# Patient Record
Sex: Female | Born: 1935 | Race: White | Hispanic: No | Smoking: Never smoker
Health system: Southern US, Community
[De-identification: ages and names within clinical notes are randomized; demographics above are authoritative.]

## PROBLEM LIST (undated history)

## (undated) DIAGNOSIS — N3289 Other specified disorders of bladder: Secondary | ICD-10-CM

## (undated) DIAGNOSIS — N1330 Unspecified hydronephrosis: Secondary | ICD-10-CM

## (undated) DIAGNOSIS — N3941 Urge incontinence: Secondary | ICD-10-CM

## (undated) DIAGNOSIS — I10 Essential (primary) hypertension: Secondary | ICD-10-CM

## (undated) DIAGNOSIS — R202 Paresthesia of skin: Secondary | ICD-10-CM

## (undated) DIAGNOSIS — E785 Hyperlipidemia, unspecified: Secondary | ICD-10-CM

## (undated) DIAGNOSIS — K219 Gastro-esophageal reflux disease without esophagitis: Secondary | ICD-10-CM

## (undated) DIAGNOSIS — R350 Frequency of micturition: Secondary | ICD-10-CM

## (undated) DIAGNOSIS — Z87440 Personal history of urinary (tract) infections: Secondary | ICD-10-CM

## (undated) DIAGNOSIS — R2681 Unsteadiness on feet: Secondary | ICD-10-CM

## (undated) DIAGNOSIS — Z8739 Personal history of other diseases of the musculoskeletal system and connective tissue: Secondary | ICD-10-CM

## (undated) DIAGNOSIS — R351 Nocturia: Secondary | ICD-10-CM

## (undated) DIAGNOSIS — Z9889 Other specified postprocedural states: Secondary | ICD-10-CM

## (undated) DIAGNOSIS — M1990 Unspecified osteoarthritis, unspecified site: Secondary | ICD-10-CM

## (undated) DIAGNOSIS — R200 Anesthesia of skin: Secondary | ICD-10-CM

## (undated) DIAGNOSIS — Z973 Presence of spectacles and contact lenses: Secondary | ICD-10-CM

## (undated) DIAGNOSIS — Z85828 Personal history of other malignant neoplasm of skin: Secondary | ICD-10-CM

## (undated) HISTORY — DX: Essential (primary) hypertension: I10

## (undated) HISTORY — PX: KNEE ARTHROSCOPY: SUR90

## (undated) HISTORY — PX: CARPAL TUNNEL RELEASE: SHX101

## (undated) HISTORY — PX: CATARACT EXTRACTION W/ INTRAOCULAR LENS IMPLANT: SHX1309

---

## 1997-05-15 HISTORY — PX: CERVICAL FUSION: SHX112

## 1998-02-02 ENCOUNTER — Observation Stay (HOSPITAL_COMMUNITY): Admission: RE | Admit: 1998-02-02 | Discharge: 1998-02-03 | Payer: Self-pay | Admitting: Orthopaedic Surgery

## 1998-04-01 ENCOUNTER — Other Ambulatory Visit: Admission: RE | Admit: 1998-04-01 | Discharge: 1998-04-01 | Payer: Self-pay | Admitting: Internal Medicine

## 2000-05-10 ENCOUNTER — Other Ambulatory Visit: Admission: RE | Admit: 2000-05-10 | Discharge: 2000-05-10 | Payer: Self-pay | Admitting: Internal Medicine

## 2001-02-03 ENCOUNTER — Emergency Department (HOSPITAL_COMMUNITY): Admission: EM | Admit: 2001-02-03 | Discharge: 2001-02-04 | Payer: Self-pay | Admitting: *Deleted

## 2003-10-28 ENCOUNTER — Encounter: Admission: RE | Admit: 2003-10-28 | Discharge: 2003-10-28 | Payer: Self-pay | Admitting: Endocrinology

## 2004-01-26 ENCOUNTER — Encounter: Admission: RE | Admit: 2004-01-26 | Discharge: 2004-01-26 | Payer: Self-pay | Admitting: Internal Medicine

## 2008-08-07 ENCOUNTER — Telehealth: Payer: Self-pay | Admitting: Internal Medicine

## 2010-08-05 ENCOUNTER — Other Ambulatory Visit: Payer: Self-pay | Admitting: Dermatology

## 2011-01-26 ENCOUNTER — Other Ambulatory Visit (HOSPITAL_COMMUNITY): Payer: BC Managed Care – PPO

## 2011-01-26 ENCOUNTER — Inpatient Hospital Stay (HOSPITAL_COMMUNITY)
Admission: AD | Admit: 2011-01-26 | Discharge: 2011-02-02 | DRG: 490 | Disposition: A | Discharge status: Rehabilitation Facility | Payer: Medicare Other | Source: Ambulatory Visit | Attending: Neurology | Admitting: Neurology

## 2011-01-26 DIAGNOSIS — M1990 Unspecified osteoarthritis, unspecified site: Secondary | ICD-10-CM | POA: Diagnosis present

## 2011-01-26 DIAGNOSIS — M7130 Other bursal cyst, unspecified site: Principal | ICD-10-CM | POA: Diagnosis present

## 2011-01-26 DIAGNOSIS — G959 Disease of spinal cord, unspecified: Secondary | ICD-10-CM | POA: Diagnosis present

## 2011-01-26 DIAGNOSIS — Z981 Arthrodesis status: Secondary | ICD-10-CM

## 2011-01-26 DIAGNOSIS — E785 Hyperlipidemia, unspecified: Secondary | ICD-10-CM | POA: Diagnosis present

## 2011-01-26 DIAGNOSIS — M109 Gout, unspecified: Secondary | ICD-10-CM | POA: Diagnosis present

## 2011-01-26 DIAGNOSIS — F411 Generalized anxiety disorder: Secondary | ICD-10-CM | POA: Diagnosis present

## 2011-01-26 DIAGNOSIS — I10 Essential (primary) hypertension: Secondary | ICD-10-CM | POA: Diagnosis present

## 2011-01-27 ENCOUNTER — Inpatient Hospital Stay (HOSPITAL_COMMUNITY): Payer: Medicare Other

## 2011-01-27 LAB — APTT: aPTT: 32 seconds (ref 24–37)

## 2011-01-27 LAB — HEMOGLOBIN A1C
Hgb A1c MFr Bld: 5.8 % — ABNORMAL HIGH (ref <5.7)
Mean Plasma Glucose: 120 mg/dL — ABNORMAL HIGH (ref <117)

## 2011-01-27 LAB — TYPE AND SCREEN
ABO/RH(D): O POS
Antibody Screen: NEGATIVE

## 2011-01-27 LAB — BASIC METABOLIC PANEL
BUN: 15 mg/dL (ref 6–23)
CO2: 30 mEq/L (ref 19–32)
Calcium: 9.2 mg/dL (ref 8.4–10.5)
Chloride: 95 mEq/L — ABNORMAL LOW (ref 96–112)
Creatinine, Ser: 0.87 mg/dL (ref 0.50–1.10)
GFR calc Af Amer: >60 mL/min (ref >60)
GFR calc non Af Amer: >60 mL/min (ref >60)
Glucose, Bld: 106 mg/dL — ABNORMAL HIGH (ref 70–99)
Potassium: 4 mEq/L (ref 3.5–5.1)
Sodium: 134 mEq/L — ABNORMAL LOW (ref 135–145)

## 2011-01-27 LAB — CBC
Platelets: 300 10*3/uL (ref 150–400)
RBC: 4.77 MIL/uL (ref 3.87–5.11)
RDW: 14.1 % (ref 11.5–15.5)
WBC: 12.2 10*3/uL — ABNORMAL HIGH (ref 4.0–10.5)

## 2011-01-27 LAB — PROTIME-INR: Prothrombin Time: 13.8 seconds (ref 11.6–15.2)

## 2011-01-27 LAB — B. BURGDORFI ANTIBODIES: B burgdorferi Ab IgG+IgM: 0.16 {ISR}

## 2011-01-29 LAB — BASIC METABOLIC PANEL
CO2: 26 mEq/L (ref 19–32)
Calcium: 9.4 mg/dL (ref 8.4–10.5)
Chloride: 95 mEq/L — ABNORMAL LOW (ref 96–112)
Creatinine, Ser: 0.93 mg/dL (ref 0.50–1.10)
Glucose, Bld: 153 mg/dL — ABNORMAL HIGH (ref 70–99)
Sodium: 133 mEq/L — ABNORMAL LOW (ref 135–145)

## 2011-01-29 NOTE — H&P (Signed)
NAMESAREEN, RANDON NO.:  [id number]  MEDICAL RECORD NO.:  [id number]  LOCATION:  3013                         FACILITY:  [facility]  PHYSICIAN:  Thana Farr, MD    DATE OF BIRTH:  Oct 05, 1930  DATE OF ADMISSION:  01/26/2011 DATE OF DISCHARGE:                             HISTORY & PHYSICAL   ADMISSION DIAGNOSES: 1. Numbness in the bilateral lower extremities. 2. Gait disorder.  HISTORY:  Ms. Lichter is a 77 year old female who underwent knee surgery approximately 6 weeks ago.  After the surgery, began to know that she was numb in the lower extremities.  Reports the numbness was worse below the knees bilaterally.  Also, had some unusual sensations above the knees.  Does not feel unusual sensation in her abdomen.  Has no bowel or bladder incontinence.  Has also noted numbness in her fingers and in her upper extremities as well.  The patient does not report any progressive nature to her symptoms.  The patient was admitted for further workup by referral from Dr. Loralee Pacas and Dr. Vickey Huger who is her outpatient neurologist.  PAST MEDICAL HISTORY:  Hypertension, anxiety, osteoarthritis, hyperlipidemia, gout.  SOCIAL HISTORY:  The patient has no history of alcohol, tobacco, or illicit drug abuse.  MEDICATIONS:  Hydrocodone, ibuprofen, lisinopril, Klonopin, Osteo Bi- Flex, fish oil, allopurinol, aspirin, cranberry, calcium carbonate.  PHYSICAL EXAMINATION:  LUNGS:  Clear to auscultation. CARDIOVASCULAR:  Single S1 and S2. NEUROLOGIC:  On mental status, the patient is alert and oriented.  She can follow commands without difficulty.  Speech is fluent.  On cranial nerve testing, II, visual fields intact.  III, IV, VI, extraocular movements intact.  V and VII, smile symmetric.  VIII, grossly intact. IX and X, positive gag.  XI, bilateral shoulder shrug.  XII, midline tongue extension.  On motor exam, the patient gives 5/5 strength throughout.  There is normal tone  and bulk.  On sensory testing, the patient reports decreased pinprick and light touch in a circumferential fashion from the feet to the knees bilaterally.  Above the knees, the patient continues to have some decreased sensation but not as severe. Once the groin is reached, the patient has no further truncal sensory complaints.  The patient does have decreased pinprick and light touch in the last 3 digits on the left upper extremity and on the middle finger on the right upper extremity.  There is decreased vibratory sensation to approximately T10.  Deep tendon reflexes are 2+ at the right ankle, absent at the left ankle, trace at the right knee, 2+ at the left knee, and 2+ in the upper extremities.  Plantars are downgoing on the right and upgoing on the left.  On cerebellar testing, finger-to-nose is intact.  There is dysmetria with heel-to-shin using the left lower extremity.  Heel-to-shin is intact with the right lower extremity.  ASSESSMENT:  Ms. Andringa is a 77 year old female that presents with paresthesias.  Etiology is unclear.  Per report by the patient, they did begin after her surgery.  They have not been progressive.  They are severe enough though that they alter her gait.  Further workup indicated.  PLAN: 1. The patient to have MRI  of the cervical, thoracic, and lumbar     spine. 2. The patient to have hemoglobin A1c performed as well as Lyme titer.     Further workup after results of above reviewed.  LABORATORY DATA:  White blood cell count 11.7, platelet count 307, hemoglobin and hematocrit 15.5 and 45.4 respectively.  Glucose 112, BUN 17, creatinine 1.0.  Sodium 135, potassium 4.6, chloride 99, CO2 28, calcium 9.7.  Total protein 7.6, albumin 3.9, AST 35, ALT 41, alk phos 88, total bili 0.4.  Protein electrophoresis unremarkable.  ANA positive.  B12 1233.  TSH normal at 0.85 with a normal T4, sed rate 10, uric acid 6.1.          ______________________________ Thana Farr, MD     LR/MEDQ  D:  01/26/2011  T:  01/27/2011  Job:  [id number]  Electronically Signed by Thana Farr MD on 01/29/2011 09:23:56 AM

## 2011-01-29 NOTE — Consult Note (Signed)
Brenda [name], Brenda [name] NO.:  [id number]  MEDICAL RECORD NO.:  [id number]  LOCATION:  3013                         FACILITY:  [facility]  PHYSICIAN:  Cristi Loron, M.D.DATE OF BIRTH:  Apr 24, 1931  DATE OF CONSULTATION:  01/27/2011 DATE OF DISCHARGE:                                CONSULTATION   CHIEF COMPLAINT:  "I can't walk."  HISTORY OF PRESENT ILLNESS:  The patient is a 77 year old white female who was in her usual state of good health until approximately 3 months ago.  It was at that time she began noting some difficulty with walking, prior to that she had been quite active.  Her symptoms progressively got worse until about 6-7 weeks ago she took a fall and got worse.  The patient has seen her primary medical doctor, Dr. Jacky Kindle, has worked up with some x-rays during which she took another fall and injured her right knee.  She was seen by Dr. Magnus Ivan for this, worked up with MRI and underwent right knee arthroscopy.  During her postoperative recovery and physical therapy, she was noted to have some weakness to her leg. Dr. Jacky Kindle referred the patient to Dr. Vickey Huger who admitted her to [facility] for further workup including MRI scans of cervical, thoracic, and lumbar spine.  When the thoracic MRI demonstrated significant spinal cord compression, Dr. Terrace Arabia requested neurosurgical consultation.  Presently, the patient is accompanied by her husband.  She tells me that she has some numbness and tingling in her legs.  She does not notice any numbness or tingling in her trunk.  She occasionally gets some urinary urgency and a bit of stressed urinary incontinence, but denies incontinence otherwise.  At this point, she has been able unable to walk over the last 3 weeks or so.  PAST MEDICAL HISTORY:  Positive for hypertension, carpal tunnel syndrome, knee injury, and cervical ruptured disk.  PAST SURGICAL HISTORY:  She has had carpal tunnel release,  knee surgery as above, and she has had an anterior cervical diskectomy and fusion by Dr. Ophelia Charter.  MEDICATIONS PRIOR TO ADMISSION: 1. Allopurinol 300 mg p.o. daily p.r.n. 2. Ibuprofen/hydrocodone. 3. Lisinopril 20 mg p.o. daily. 4. She took over-the-counter vitamin B, multivitamins, fish oil,     glucosamine.  She has no known drug allergies.  FAMILY MEDICAL HISTORY:  Noncontributory.  SOCIAL HISTORY:  The patient is married.  She has a son and a daughter. She denies tobacco, ethanol, drug use.  She is retired.  REVIEW OF SYSTEMS:  Negative except as above.  PHYSICAL EXAMINATION:  GENERAL:  A pleasant healthy-appearing 77 year old white female in no apparent distress. HEENT:  Normocephalic, atraumatic.  Her pupils are equal, round, and reactive light.  Extraocular muscles intact.  Oropharynx benign. NECK:  Supple without masses, deformities, tracheal deviation.  She has a mildly decreased cervical range of motion but appropriate for age. Spurling testing is negative.  Lhermitte sign is not present.  Thorax is symmetric. ABDOMEN:  Soft.EXTREMITIES:  No obvious deformities. BACK:  No point tenderness or deformities. NEUROLOGIC:  The patient is alert and oriented x3.  Glasgow coma scale 15.  Cranial nerves II through XII are examined bilaterally  and grossly normal.  Vision, hearing grossly normal bilaterally.  Motor strength is 5/5 in her bilateral deltoid, biceps, triceps, handgrip.  She has at least 4+/5 strength in her bilateral quadriceps, gastrocnemius, dorsiflexors/EHL, right psoas.  She has some slight weakness in left psoas at 4/5.  Cerebellar exam is intact to rapid alternating movements of the upper extremities bilaterally.  Sensory function demonstrates some decreased light touch sensation in the patient's bilateral lower extremities.  Deep tendon reflexes are 2/4 about biceps, triceps, 3/4 about quadriceps, gastrocnemius.  There is no ankle clonus.  IMAGING STUDIES:   I have reviewed the patient's cervical MRI performed at [facility] on January 26, 2011.  The patient has had a prior noninstrumented C5-6 anterior cervical diskectomy and fusion.  She previously had a good arthrodesis there.  The patient has moderate spinal stenosis at C4-5.  The remainder of the cervical levels are fairly unremarkable.  Also, the patient's thoracic MRI performed at [facility] on January 26, 2011.  It is fairly unremarkable from T1-2 down to T9-10. The patient does have an intraspinal extra-axial lesion at T10 on the left posteriorly and has some peripheral enhancement.  There is a significant spinal cord compression.  Also, the patient's lumbar MRI performed at [facility] on January 26, 2011, demonstrates multilevel spinal stenosis.  She has mild spinal stenosis at L1-2.  She has moderate-to-severe spinal stenosis at L2-3, L3-4, L4-5.  At L4-5, she has a spondylolisthesis.  L5- S1 is relatively unremarkable.  ASSESSMENT AND PLAN:  T10 lesion, thoracic myelopathy.  I have discussed the situation with the patient and her husband (at the patient's request).  I have told him that she has some stenosis in her cervical lumbar spine, but the acute problem seems to be the severe stenosis at T10.  I told her she appears to having either a benign tumor or significant arthritis causing significant spinal stenosis.  We discussed the various treatment options, and I have recommended she undergo a thoracic laminectomy for decompression of her spinal cord.  I have described that surgery to them, and discussed the risk of surgery including the risk of anesthesia, hemorrhage, infection, spinal fluid leak, worsening neurologic, failure to improve her symptoms, medical risk, etc.  I have answered all her questions and I have asked him to think things over.  I do not think we need to do a surgery emergently as her legs are quite strong, and I would  tentatively plan and do the surgery on Monday.     Cristi Loron, M.D.     JDJ/MEDQ  D:  01/27/2011  T:  01/27/2011  Job:  [id number]  cc:   Melvyn Novas, M.D. Geoffry Paradise, MD  Electronically Signed by Tressie Stalker M.D. on 01/29/2011 09:13:15 AM

## 2011-01-30 ENCOUNTER — Inpatient Hospital Stay (HOSPITAL_COMMUNITY): Payer: Medicare Other

## 2011-01-30 ENCOUNTER — Other Ambulatory Visit: Payer: Self-pay | Admitting: Neurosurgery

## 2011-01-30 HISTORY — PX: THORACIC LAMINECTOMY: SHX96

## 2011-01-30 LAB — CBC
HCT: 42.3 % (ref 36.0–46.0)
MCH: 29.5 pg (ref 26.0–34.0)
MCV: 84.4 fL (ref 78.0–100.0)
Platelets: 349 10*3/uL (ref 150–400)
RBC: 5.01 MIL/uL (ref 3.87–5.11)
WBC: 19.6 10*3/uL — ABNORMAL HIGH (ref 4.0–10.5)

## 2011-01-30 LAB — COMPREHENSIVE METABOLIC PANEL
ALT: 25 U/L (ref 0–35)
AST: 19 U/L (ref 0–37)
Albumin: 3.6 g/dL (ref 3.5–5.2)
Alkaline Phosphatase: 85 U/L (ref 39–117)
CO2: 27 mEq/L (ref 19–32)
Chloride: 93 mEq/L — ABNORMAL LOW (ref 96–112)
Creatinine, Ser: 0.91 mg/dL (ref 0.50–1.10)
GFR calc non Af Amer: 60 mL/min — ABNORMAL LOW (ref >60)
Potassium: 4.2 mEq/L (ref 3.5–5.1)
Sodium: 129 mEq/L — ABNORMAL LOW (ref 135–145)
Total Bilirubin: 0.3 mg/dL (ref 0.3–1.2)

## 2011-01-30 LAB — SURGICAL PCR SCREEN
MRSA, PCR: NEGATIVE
Staphylococcus aureus: NEGATIVE

## 2011-01-30 LAB — GLUCOSE, CAPILLARY

## 2011-02-01 DIAGNOSIS — G8220 Paraplegia, unspecified: Secondary | ICD-10-CM

## 2011-02-01 DIAGNOSIS — C720 Malignant neoplasm of spinal cord: Secondary | ICD-10-CM

## 2011-02-02 ENCOUNTER — Inpatient Hospital Stay (HOSPITAL_COMMUNITY)
Admission: RE | Admit: 2011-02-02 | Discharge: 2011-02-09 | DRG: 945 | Disposition: A | Discharge status: Home / Self Care | Payer: Medicare Other | Source: Other Acute Inpatient Hospital | Attending: Physical Medicine & Rehabilitation | Admitting: Physical Medicine & Rehabilitation

## 2011-02-02 DIAGNOSIS — M4714 Other spondylosis with myelopathy, thoracic region: Secondary | ICD-10-CM | POA: Diagnosis present

## 2011-02-02 DIAGNOSIS — K592 Neurogenic bowel, not elsewhere classified: Secondary | ICD-10-CM | POA: Diagnosis present

## 2011-02-02 DIAGNOSIS — D72829 Elevated white blood cell count, unspecified: Secondary | ICD-10-CM | POA: Diagnosis present

## 2011-02-02 DIAGNOSIS — C720 Malignant neoplasm of spinal cord: Secondary | ICD-10-CM

## 2011-02-02 DIAGNOSIS — G8220 Paraplegia, unspecified: Secondary | ICD-10-CM | POA: Diagnosis present

## 2011-02-02 DIAGNOSIS — Z5189 Encounter for other specified aftercare: Secondary | ICD-10-CM

## 2011-02-02 DIAGNOSIS — M109 Gout, unspecified: Secondary | ICD-10-CM | POA: Diagnosis present

## 2011-02-02 DIAGNOSIS — F411 Generalized anxiety disorder: Secondary | ICD-10-CM | POA: Diagnosis present

## 2011-02-02 DIAGNOSIS — N319 Neuromuscular dysfunction of bladder, unspecified: Secondary | ICD-10-CM | POA: Diagnosis present

## 2011-02-02 DIAGNOSIS — E871 Hypo-osmolality and hyponatremia: Secondary | ICD-10-CM | POA: Diagnosis present

## 2011-02-02 DIAGNOSIS — K5900 Constipation, unspecified: Secondary | ICD-10-CM | POA: Diagnosis present

## 2011-02-02 DIAGNOSIS — I10 Essential (primary) hypertension: Secondary | ICD-10-CM | POA: Diagnosis present

## 2011-02-02 DIAGNOSIS — E785 Hyperlipidemia, unspecified: Secondary | ICD-10-CM | POA: Diagnosis present

## 2011-02-02 LAB — URINALYSIS, ROUTINE W REFLEX MICROSCOPIC
Bilirubin Urine: NEGATIVE
Bilirubin Urine: NEGATIVE
Glucose, UA: NEGATIVE mg/dL
Hgb urine dipstick: NEGATIVE
Hgb urine dipstick: NEGATIVE
Ketones, ur: NEGATIVE mg/dL
Nitrite: NEGATIVE
Specific Gravity, Urine: 1.007 (ref 1.005–1.030)
Specific Gravity, Urine: 1.005 (ref 1.005–1.030)
Urobilinogen, UA: 0.2 mg/dL (ref 0.0–1.0)
pH: 6 (ref 5.0–8.0)
pH: 6.5 (ref 5.0–8.0)

## 2011-02-03 DIAGNOSIS — C720 Malignant neoplasm of spinal cord: Secondary | ICD-10-CM

## 2011-02-03 DIAGNOSIS — G8220 Paraplegia, unspecified: Secondary | ICD-10-CM

## 2011-02-03 LAB — COMPREHENSIVE METABOLIC PANEL
CO2: 29 mEq/L (ref 19–32)
Calcium: 8.1 mg/dL — ABNORMAL LOW (ref 8.4–10.5)
Creatinine, Ser: 0.85 mg/dL (ref 0.50–1.10)
GFR calc Af Amer: >60 mL/min (ref >60)
GFR calc non Af Amer: >60 mL/min (ref >60)
Glucose, Bld: 94 mg/dL (ref 70–99)

## 2011-02-03 LAB — MISCELLANEOUS TEST

## 2011-02-03 LAB — DIFFERENTIAL
Basophils Absolute: 0 10*3/uL (ref 0.0–0.1)
Basophils Relative: 0 % (ref 0–1)
Lymphocytes Relative: 24 % (ref 12–46)
Monocytes Absolute: 1.9 10*3/uL — ABNORMAL HIGH (ref 0.1–1.0)
Monocytes Relative: 13 % — ABNORMAL HIGH (ref 3–12)
Neutro Abs: 8.5 10*3/uL — ABNORMAL HIGH (ref 1.7–7.7)
Neutrophils Relative %: 60 % (ref 43–77)

## 2011-02-03 LAB — URINE CULTURE
Culture  Setup Time: 201209201649
Culture: NO GROWTH

## 2011-02-03 LAB — CBC
HCT: 37.7 % (ref 36.0–46.0)
Hemoglobin: 13.2 g/dL (ref 12.0–15.0)
MCH: 29.5 pg (ref 26.0–34.0)
RBC: 4.47 MIL/uL (ref 3.87–5.11)

## 2011-02-04 DIAGNOSIS — G8220 Paraplegia, unspecified: Secondary | ICD-10-CM

## 2011-02-04 DIAGNOSIS — C720 Malignant neoplasm of spinal cord: Secondary | ICD-10-CM

## 2011-02-06 ENCOUNTER — Inpatient Hospital Stay (HOSPITAL_COMMUNITY): Payer: Medicare Other

## 2011-02-06 MED ORDER — GADOBENATE DIMEGLUMINE 529 MG/ML IV SOLN
15.0000 mL | Freq: Once | INTRAVENOUS | Status: AC
  Administered 2011-02-06: 15 mL via INTRAVENOUS

## 2011-02-07 NOTE — H&P (Signed)
Brenda [name], Brenda [name] NO.:  [id number]  MEDICAL RECORD NO.:  [id number]  LOCATION:  4033                         FACILITY:  [facility]  PHYSICIAN:  Erick Colace, M.D.DATE OF BIRTH:  02-19-1931  DATE OF ADMISSION:  02/02/2011 DATE OF DISCHARGE:                             HISTORY & PHYSICAL   REASON FOR ADMISSION:  Paraparesis due to thoracic myelopathy.  HISTORY:  A 77 year old female with recent right knee arthroscopy 6 weeks ago, admitted with progressive lower extremity weakness and numbness.  On January 26, 2011, MRI of the CTL-spine showed a T10 intraspinal extradural extramedullary tumor as well as stenosis and myelopathy, underwent T9 through T11 laminectomy resection of the tumor on January 30, 2011, per Dr. Delma Officer.  A Decadron protocol was initiated.  Postoperatively, noted to have hyponatremia, down to 129, this was monitored.  Blood pressure was modestly elevated and Norvasc and Lopressor were added to the regimen.  Norvasc was just increased on February 02, 2011.  Decadron protocol completed on February 01, 2011. Because of poor progression with mobility and self-care, PMN and R was consulted.  The patient felt to be a good rehab candidate.  PAST HISTORY:  Hypertension, hyperlipidemia, gout, anxiety.  PAST SURGICAL HISTORY:  Carpal tunnel release, right knee arthroscopy, ACDF by Dr. Ophelia Charter many years ago.  HABITS:  Negative EtOH, negative tobacco.  FAMILY HISTORY:  Positive CAD.  SOCIAL HISTORY:  Married.  Husband and daughter plans to assist as needed.  Two-level home.  Bedroom downstairs.  Five steps to enter.  PRIOR FUNCTIONAL HISTORY:  Independent using a cane or walker.  HOME MEDICATIONS:  Include lisinopril, allopurinol, fish oil, multivitamin, and iron.  ALLERGIES:  None known.  LABS:  Last hemoglobin 14.8, white count is 19 on steroids, platelets 349,000.  BUN 31, creatinine 0.9 on January 30, 2011.  Sodium  129, potassium 4.2.  REVIEW OF SYSTEMS:  Positive for anxiety.  Positive for weakness and numbness in bilateral lower extremity and feet.  Positive for incontinence, otherwise negative.  PHYSICAL EXAMINATION:  Please see health and history form. VITAL SIGNS:  Blood pressure 167/74, pulse 64, respirations 20, temperature 97.9. GENERAL:  Overweight female, in no acute distress. HEENT:  Eyes, anicteric, noninjected.  External ENT normal. NECK:  Supple without adenopathy. CHEST:  Respiratory effort is good.  Lungs are clear. HEART:  Regular rate and rhythm.  No rubs, murmurs, or extra sounds. ABDOMEN:  Decreased bowel sounds, soft, nontender to palpation. Negative tympani. NEUROLOGIC:  Mental status, alert and oriented x3.  Mood and affect are mildly anxious.  Memory is intact.  Judgment is intact.  Deep tendon reflexes are hyperactive in bilateral ankles and knees.  Sensation mildly reduced in bilateral feet.  Motor strength is 2 bilateral hip flexor, 3+ bilateral knee extensor and ankle dorsiflexor.  Upper extremity strength is 5/5 deltoid, biceps, triceps, and grip.  Range of motion is normal in the upper extremities.  Decreased active range of motion in the hips, knees, and ankles. SKIN:  Shows well-healing midline thoracic incision.  POST ADMISSION PHYSICIAN EVALUATION: 1. Functional deficit secondary to paraparesis secondary to T10 tumor,     myelopathy. 2. The patient is  admitted to receive collaborative interdisciplinary     care between the physiatrist, rehab nursing staff, and therapy     team. 3. The patient's level of medical complexity and substantial therapy     needs in context to the medical necessity cannot be provided at a     lesser intensity of care. 4. The patient has experienced substantial functional loss from her     baseline.  Upon functional assessment at the time of preadmission     screening, the patient was +2 total assist 60% for bed mobility and      transfers, min assist gait, walker 30 feet.  ADLs not tested.  Upon     functional assessment today, the patient was a min assist bed     mobility, min assist transfers, min assist 160 feet bed mobility,     mod assist lower body dressing, min assist toilet transfers,     urinary incontinence noted.  Judging by the patient's diagnosis,     physical exam, and functional history, the patient has displayed     the ability to make functional gains which will result in     measurable gains while on inpatient rehab.  These gains will be of     substantial and practical use upon discharge home in facilitating     mobility, self-care, and independence.  Interim changes in medical     status since preadmission screening are detailed in the history of     present illness. 5. Physiatrist will provide 24-hour management, medical needs as well     as oversight of therapy plan/treatment to provide guidance as     appropriate regarding the interaction of the two. 6. A 24-hour rehab nursing will assess in management of skin, bowel     and bladder, and help integrate therapy concepts, techniques, and     education. 7. OT will assess and treat for pre-gait training, gait training,     endurance, safety.  Goals are for modified independent level with     all mobility. 8. OT will assess and treat for ADLs, safety, endurance, equipment     goals are for modified independent level with upper body and lower     body ADLs. 9. Case management and social work will assess and treat for     psychosocial issues and discharge planning. 10.Team conference will be held weekly to assess the patient's     progress as well as to determine barriers to discharge. 11.The patient has demonstrated sufficient medical stability and     exercise capacity to tolerate at least 3 hours therapy per day at     least 5 days per week. 12.Estimated length of stay is 10 days.  Prognosis for further     functional improvement is  good.  MEDICAL PROBLEMS AND PLAN: 1. Deep vein thrombosis prophylaxis by high TEDs. 2. Pain management with Vicodin. 3. Hypertension.  Norvasc, lisinopril, Lopressor.  We will monitor     with increase activity. 4. Gout.  Allopurinol.  Monitor for flare up.  Monitor renal status. 5. Motivation and mood appeared to be adequate for full participation     in inpatient rehabilitation program.  Rehab Medicine Services were     explained.  Questions answered.     Erick Colace, M.D.     AEK/MEDQ  D:  02/02/2011  T:  02/02/2011  Job:  [id number]  cc:   Geoffry Paradise, M.D. Cristi Loron, M.D.  Electronically Signed  by Claudette Laws M.D. on 02/07/2011 01:37:31 PM

## 2011-02-09 NOTE — Op Note (Signed)
  [name], Brenda [name] NO.:  [id number]  MEDICAL RECORD NO.:  [id number]  LOCATION:  3013                         FACILITY:  [facility]  PHYSICIAN:  Cristi Loron, M.D.DATE OF BIRTH:  [birth year]  DATE OF PROCEDURE:  01/30/2011 DATE OF DISCHARGE:                              OPERATIVE REPORT   BRIEF HISTORY:  The patient is a 77 year old white female who has developed a progressive difficulty with ambulation.  She was admitted by Dr. Thana Farr and underwent further neurologic workup including a MRI scan of her cervical, thoracic, and lumbar spine.  The thoracic MRI demonstrated the patient had a large intraspinal extradural lesion at T10.  A Neurosurgical consultation requested.  I saw the patient for symptoms consistent with a thoracic myelopathy and I recommended surgery.  The patient and her family have weighed the risks, benefits, and alternatives of the surgery and decided to proceed with the operation.  The patient was brought to the operating room by the anesthesia team. General endotracheal anesthesia was induced.  While anesthesia was intubating the patient, I pulled up the patient's MRI scan.  To my surprise, the name and birth date on the scan did not exactly match the name and birth date of the patient.  I subsequently called the radiologist, Dr. Marin Roberts.  We discussed the situation with him.  I am fairly confident that we are indeed looking at this patient's MRI scan, but mistakenly another the patient's name was placed on her scan.  I subsequently spoke to Dr. Thana Farr, neurologist, and she too felt this would be the case.  We went on to get a cervical x-ray and was confirmed the patient indeed had a C5-6 fusion, which was consistent with the above mistake.  Nonetheless, I could not be completely assured that the MRI scan I was looking at was indeed the patient on the operating room scan.  I therefore decided it would be  prudent to wake the patient up and repeat her thoracic MRI to make sure that she indeed had T10 lesion.  I discussed this fully with the patient's family.  I answered all the questions.  The patient was subsequently extubated and went on to get a thoracic MRI, which demonstrated the T10 lesion as expected.     Cristi Loron, M.D.     JDJ/MEDQ  D:  01/30/2011  T:  01/31/2011  Job:  [id number]  Electronically Signed by Tressie Stalker M.D. on 02/09/2011 09:41:18 AM

## 2011-02-09 NOTE — Op Note (Signed)
NAMESHERENA, Brenda [name] NO.:  [id number]  MEDICAL RECORD NO.:  [id number]  LOCATION:  3013                         FACILITY:  [facility]  PHYSICIAN:  Cristi Loron, M.D.DATE OF BIRTH:  1930-09-29  DATE OF PROCEDURE:  01/30/2011 DATE OF DISCHARGE:                              OPERATIVE REPORT   BRIEF HISTORY:  The patient is a 77 year old white female who has developed a progressive thoracic myelopathy.  She has got to the point where she cannot walk.  She was admitted by Dr. Thana Farr and underwent a cervical thoracic and lumbar MRI, which demonstrated the patient had a large T10 intraspinal extradural lesion.  The patient's signs, symptoms, and physical exam are consistent with thoracic myelopathy.  I discussed the situation with the patient, her husband, and daughter, and recommend she undergone a thoracic laminectomy for resection of this lesion.  The patient and family have weighed the risks, benefits, and alternatives of surgery and decided to proceed with that operation.  PREOPERATIVE DIAGNOSES:  T10 intraspinal extradural extramedullary tumor, thoracic myelopathy, thoracic spinal stenosis.  POSTOPERATIVE DIAGNOSES:  T10 intraspinal extradural extramedullary tumor, thoracic myelopathy, thoracic spinal stenosis with the preliminary diagnosis of intraspinal synovial cyst.  PROCEDURE PERFORMED:  T9 and T11 laminectomy for resection of intraspinal extradural extramedullary tumor using microdissection.  SURGEON:  Cristi Loron, MD  ASSISTANT:  Stefani Dama, MD  ANESTHESIA:  Endotracheal.  ESTIMATED BLOOD LOSS:  125 mL.  SPECIMENS:  Tumor.  DRAINS:  None.  COMPLICATIONS:  None.  DESCRIPTION OF PROCEDURE:  The patient was brought to the operating room by Anesthesia team.  General endotracheal anesthesia was induced.  The patient was turned to the prone position on the Wilson frame.  Her thoracolumbar region was then prepared with  Betadine scrub and Betadine solution.  Sterile drapes were applied and then injected the area to be incised with Marcaine with epinephrine solution.  We used a scalpel to make a linear midline incision over the T 9 through T-11 lamina.  I used electrocautery to perform bilateral subperiosteal dissection exposing the spinous process and lamina from T9 to T11.  We obtained intraoperative radiograph to confirm our location.  I then inserted a McCullough retractor for exposure.  I then incised the interspinous ligament at T8-9, T9-10,  T10-11, T11-12 with the scalpel.  I then used the Leksell rongeur to remove the spinous process at T9, T10, and T11.  I then used high-speed drill to perform bilateral T9, T10, T11 laminotomies.  I used the Kerrison punch to complete the laminectomy at T9, T10, and T11 and removed the ligamentum flavum at T9-10, T10-11, T11-12.  We encountered an epidural lesion as expected on the left of T10.  This appeared to be a large synovial cyst.  We brought the operative microscope into the field and under its magnification and illumination completed the microdissection/decompression.  I carefully dissected a plane between the patient's dura and this lesion.  The lesion was quite adherent to the dura, but through microdissection I was able to get a clean plane between the lesion and the dura.  We used the Sonopet to internally debulk this lesion in order to give  [place] more working room.  We then completed the gross total resection of this lesion.  Using the Sonopet, as well as the Kerrison punches, we obtained a gross total resection of the lesion.  We sent off multiple specimens to the pathologist for permanent section.  At this point, we had good decompression of the thecal sac.  We obtained hemostasis using bipolar cautery.  We irrigated the wound out with bacitracin solution.  We then removed the retractors and reapproximated the patient's thoracic fascia with  interrupted #1 Vicryl suture, subcutaneous tissue with interrupted 2-0 Vicryl suture, and skin with Steri-Strips and Benzoin.  The wound was then coated with bacitracin ointment.  A sterile dressing was applied.  The drapes were removed and the patient was subsequently returned to supine position where she was extubated by the Anesthesia team and transported to the Post Anesthesia Care Unit in stable condition.  All sponge, instrument, and needle counts were correct at the end of this case.     Cristi Loron, M.D.     JDJ/MEDQ  D:  01/30/2011  T:  01/31/2011  Job:  [id number]  Electronically Signed by Tressie Stalker M.D. on 02/09/2011 09:41:39 AM

## 2011-02-10 NOTE — Discharge Summary (Signed)
NAMESHANNA, UN NO.:  [id number]  MEDICAL RECORD NO.:  [id number]  LOCATION:  3013                         FACILITY:  [facility]  PHYSICIAN:  Thana Farr, MD    DATE OF BIRTH:  [birth year]  DATE OF ADMISSION:  01/26/2011 DATE OF DISCHARGE:                              DISCHARGE SUMMARY   ADMITTING DIAGNOSES:  Numbness in bilateral lower extremities and gait disorder.  DISCHARGE DIAGNOSIS:  Cyst at T9 through T10, status post removal second after laminectomy by Dr. Lovell Sheehan.  BRIEF HISTORY:  Brenda [name] is a 77 year old female who underwent knee surgery approximately 6 weeks ago.  After surgery, she began to note that she was having numbness in her lower extremities.  She reports numbness worse below her knees bilaterally.  She also noted some unusual sensations above her knees.  She does not feel any unusual sensations in her abdomen.  She has had no bowel or bladder incontinence.  Brenda [name] was initially admitted for workup by Dr. Porfirio Mylar Dohmeier.  After admission, MRI of her T-spine was obtained which showed an extradural mass at Brenda level of T10.  Neurosurgery was consulted and Dr. Lovell Sheehan saw Brenda [name].  At that time, he believed his symptoms were consistent with a thoracic myelopathy and recommended surgery.  Brenda [name] agreed to Brenda surgery and underwent surgical resection on January 30, 2011. Surgical procedure performed.  Was a T10 laminectomy with extraction of T10 extradural cyst.  Status post surgery Brenda [name] recovered quite quickly and had no complications.  Brenda [name] was able to walk with physical therapy with assistance.  Physical Therapy recommended inpatient rehab for further treatment and rehabilitation.  Inpatient rehab has consulted and has agreed and to accept Brenda [name].  DISCHARGE MEDICATIONS: 1. Acetaminophen 325 mg tab one by mouth every 4 h. as needed. 2. Allopurinol 300 mg tab 1 by mouth daily. 3. Amlodipine 10 mg  daily. 4. Docusate 100 mg cap b.i.d. 5. Famotidine 20 mg cap by mouth b.i.d. 6. Hydrocodone 5/325 one to two tabs by mouth every 4 h. as needed. 7. Lisinopril 20 mg tab 1 by mouth b.i.d. 8. Metoprolol 25 mg tab 1 by mouth daily. 9. Multivitamin. 10.Omega-3 fatty acid 1 g by mouth daily. 11.Vitamin B complex 1 cap daily. 12.Zolpidem 5 mg by mouth at bedtime as needed. 13.Glucosamine over-Brenda-counter 1 tab by mouth daily.  DISCHARGE PHYSICAL EXAMINATION:  Pupils are equal, round, reactive to light and accommodating.  Extraocular movements are grossly intact. Tongue is midline.  Speech is clear and well enunciated.  Conversation are within normal limits.  Facial sensation is grossly intact.  Vision is intact to double simultaneous stimuli.  Shoulder shrug and head turn are within normal limits.  Upper extremities show 5/5 strength throughout.  Sensation grossly intact.  Bilateral lower extremity show 4/5 strength.  Sensation is slightly decreased over Brenda left leg, but has improved significantly over her hospital stay.  Deep tendon reflexes are 2+ in Brenda upper extremity.  She has 2+ in her left patella, 1+ to 2+ in her right patella.  She has downgoing toes bilaterally and 1+ Achilles bilaterally.  Finger-to-nose and heel-to-shin were smooth.  Brenda  [name] was able to walk with assistance and verbal cues.  DISCHARGE LABORATORY DATA:  Urinalysis which was negative.  On January 30, 2011, her sodium was 129, potassium 4.2, chloride 93, CO2 27, glucose 138, BUN 31, creatinine 0.91, AST is 19, ALT is 25.  CBC on January 30, 2011, showed a white blood cell count of 19.6, hemoglobin 14.8, hematocrit of 42.3 and platelets of 349.  DISPOSITION:  Brenda [name] is to be discharged to inpatient rehab today.  FOLLOWUP:  Brenda [name] will follow up with Dr. Vickey Huger as an outpatient and Dr. Lovell Sheehan after inpatient rehab.     Felicie Morn, PA-C   ______________________________ Thana Farr, MD    DS/MEDQ  D:  02/02/2011  T:  02/02/2011  Job:  [id number]  cc:   Thana Farr, MD  Electronically Signed by Felicie Morn PA-C on 02/03/2011 03:39:33 PM Electronically Signed by Thana Farr MD on 02/10/2011 09:47:08 AM

## 2011-02-13 ENCOUNTER — Ambulatory Visit
Payer: Medicare Other | Attending: Physical Medicine & Rehabilitation | Admitting: Rehabilitative and Restorative Service Providers"

## 2011-02-13 DIAGNOSIS — R269 Unspecified abnormalities of gait and mobility: Secondary | ICD-10-CM | POA: Insufficient documentation

## 2011-02-13 DIAGNOSIS — IMO0001 Reserved for inherently not codable concepts without codable children: Secondary | ICD-10-CM | POA: Insufficient documentation

## 2011-02-13 DIAGNOSIS — M6281 Muscle weakness (generalized): Secondary | ICD-10-CM | POA: Insufficient documentation

## 2011-02-13 NOTE — Discharge Summary (Signed)
[name], [name] NO.:  [id number]  MEDICAL RECORD NO.:  [id number]  LOCATION:  4033                         FACILITY:  [facility]  PHYSICIAN:  Ranelle Oyster, M.D.DATE OF BIRTH:  12/03/1930  DATE OF ADMISSION:  02/02/2011 DATE OF DISCHARGE:  02/09/2011                              DISCHARGE SUMMARY   DISCHARGE DIAGNOSES: 1. Thoracic T10 tumor with myelopathy, status post thoracic T9-T11     laminectomy with resection on September 17, pain management. 2. Hypertension. 3. Questionable neurogenic bladder. 4. Gout.  HISTORY:  A 77 year old white female with recent right knee arthroscopy 6 weeks ago, who was admitted with progressive lower extremity weakness and numbness since her knee surgery.  MRI of cervical, thoracic and lumbar spine showed a thoracic T10 intraspinal extradural extramedullary tumor as well as stenosis with myelopathy.  Underwent thoracic T9-T11 laminectomy for resection of tumor on September 17 per Dr. Lovell Sheehan. Decadron protocol initiated.  Postoperative hyponatremia, at 129 and monitored.  Pathology report is negative.  Blood pressure with modest increased variables with Norvasc and Lopressor added in addition to Norvasc.  Decadron protocol was completed on September 19.  She was total assist for bed mobility.  She is minimal assist ambulation.  She was admitted for comprehensive rehab program.  PAST MEDICAL HISTORY:  See discharge diagnoses.  SOCIAL HISTORY:  Married.  Husband and daughter plan to assist.  Two- level home, bedroom downstairs, five steps to entry.  FUNCTIONAL HISTORY PRIOR TO ADMISSION:  Independent with a cane and walker.  FUNCTIONAL STATUS UPON ADMISSION TO REHAB SERVICES:  Total assist bed mobility and transfers, minimal assist to ambulate with decreased balance, total assist to lower body activities of daily living.  MEDICATIONS PRIOR TO ADMISSION: 1. Lisinopril 20 mg twice daily. 2. Allopurinol 300 mg  daily. 3. Fish oil daily. 4. Multivitamin.  ALLERGIES:  None.  PHYSICAL EXAMINATION:  VITAL SIGNS:  Blood pressure 167/74, pulse 54, temperature 97.9, respirations 20. NEUROLOGIC:  This was an alert female, oriented x3.  Deep tendon reflexes 2+.  Decreased sensation at thoracic T10.  Back incision clean and dry. LUNGS:  Clear to auscultation. CARDIAC:  Regular rate and rhythm. ABDOMEN:  Soft, nontender.  Good bowel sounds.  REHABILITATION HOSPITAL COURSE:  The patient was admitted to inpatient rehab services with therapies initiated on a 3-hour daily basis consisting of physical therapy, occupational therapy, and rehabilitation nursing.  The following issues were addressed during the patient's rehabilitation stay.  Pertaining to [name] thoracic T10 tumor with myelopathy, she had undergone thoracic T9-T11 laminectomy with resection on September 17 per Dr. Lovell Sheehan.  Neurovascular sensation was intact.  Surgical incision was healing nicely.  Decadron protocol completed.  Pathology report was negative.  She remained on Percocet for pain management with good results.  Blood pressures controlled with combination of Norvasc, lisinopril and Lopressor with no orthostatic changes.  She would follow up with her primary MD in regards to her hypertension.  She did have a history of gout.  She remained on allopurinol with no flare-ups of gout noted.  Noted some increased postvoid residuals that did tend to improve throughout her rehab stay down to 220 from 240.  Urinalysis study with no growth.  The patient had noted history of overactive bladder in the past prior to back surgery a trial of Ditropan was ordered.  This was later discontinued.  It was felt this will continue to improve as her mobility improved.  The patient received weekly collaborative interdisciplinary team conferences to discuss estimated length of stay, family teaching, and any barriers to her discharge.  She was  steady assist for activities of daily living, supervision overall for mobility, strength and endurance had greatly improved.  She was encouraged overall progress.  She was fitted with a right ASO brace per advanced prosthetics.  LABORATORY DATA:  Latest labs showed urinalysis study of no growth. Sodium 133, potassium 3.8, BUN 26, creatinine 0.8, hemoglobin of 13.2, hematocrit 37, platelet 261,000, WBC of 14.2.  DISCHARGE MEDICATIONS AT TIME OF DICTATION: 1. Allopurinol 300 mg daily. 2. Norvasc 10 mg daily. 3. Senokot tablets 2 daily. 4. Pepcid 20 mg twice daily. 5. Lisinopril 20 mg twice daily. 6. Lopressor 25 mg daily. 7. Multivitamin daily. 8. Lovaza 1 gram daily. 9. Vitamin B with C daily 10.Percocet 5/325 one or two tablets every 6 hours as needed for pain,     dispensed 60 tablets.  DIET:  Regular.  SPECIAL INSTRUCTION:  No driving.  Follow up with Dr. Lovell Sheehan, Neurosurgery, call for appointment; Dr. Faith Rogue of Rehab Services, appointment made; Dr. Geoffry Paradise, Medical Management. Home health therapies had been arranged.     Mariam Dollar, P.A.   ______________________________Zachary Ermalene Postin, M.D.    DA/MEDQ  D:  02/09/2011  T:  02/09/2011  Job:  [id number]  cc:   Ranelle Oyster, M.D. Geoffry Paradise, M.D. Cristi Loron, M.D. Thana Farr, MD  Electronically Signed by Mariam Dollar P.A. on 02/13/2011 09:41:27 AM Electronically Signed by Faith Rogue M.D. on 02/13/2011 01:22:32 PM

## 2011-02-15 ENCOUNTER — Ambulatory Visit: Payer: Medicare Other | Admitting: Physical Therapy

## 2011-02-17 ENCOUNTER — Ambulatory Visit: Payer: Medicare Other | Admitting: Physical Therapy

## 2011-02-20 ENCOUNTER — Ambulatory Visit: Payer: Medicare Other | Admitting: Physical Therapy

## 2011-02-22 ENCOUNTER — Ambulatory Visit: Payer: Medicare Other | Admitting: *Deleted

## 2011-02-24 ENCOUNTER — Ambulatory Visit: Payer: Medicare Other | Admitting: Physical Therapy

## 2011-02-27 ENCOUNTER — Ambulatory Visit: Payer: Medicare Other | Admitting: Rehabilitative and Restorative Service Providers"

## 2011-03-01 ENCOUNTER — Ambulatory Visit: Payer: Medicare Other | Admitting: Rehabilitative and Restorative Service Providers"

## 2011-03-03 ENCOUNTER — Ambulatory Visit: Payer: Medicare Other | Admitting: Physical Therapy

## 2011-03-06 ENCOUNTER — Ambulatory Visit: Payer: Medicare Other | Admitting: Rehabilitative and Restorative Service Providers"

## 2011-03-08 ENCOUNTER — Ambulatory Visit: Payer: Medicare Other | Admitting: Rehabilitative and Restorative Service Providers"

## 2011-03-10 ENCOUNTER — Ambulatory Visit: Payer: Medicare Other | Admitting: Rehabilitative and Restorative Service Providers"

## 2011-03-13 ENCOUNTER — Ambulatory Visit: Payer: Medicare Other | Admitting: Rehabilitative and Restorative Service Providers"

## 2011-03-15 ENCOUNTER — Ambulatory Visit: Payer: Medicare Other | Admitting: Rehabilitative and Restorative Service Providers"

## 2011-03-17 ENCOUNTER — Ambulatory Visit
Payer: Medicare Other | Attending: Physical Medicine & Rehabilitation | Admitting: Rehabilitative and Restorative Service Providers"

## 2011-03-17 DIAGNOSIS — IMO0001 Reserved for inherently not codable concepts without codable children: Secondary | ICD-10-CM | POA: Insufficient documentation

## 2011-03-17 DIAGNOSIS — R269 Unspecified abnormalities of gait and mobility: Secondary | ICD-10-CM | POA: Insufficient documentation

## 2011-03-17 DIAGNOSIS — M6281 Muscle weakness (generalized): Secondary | ICD-10-CM | POA: Insufficient documentation

## 2011-03-20 ENCOUNTER — Ambulatory Visit: Payer: Medicare Other | Admitting: Physical Therapy

## 2011-03-22 ENCOUNTER — Ambulatory Visit: Payer: Medicare Other | Admitting: Rehabilitative and Restorative Service Providers"

## 2011-03-24 ENCOUNTER — Encounter: Payer: Medicare Other | Attending: Physical Medicine & Rehabilitation | Admitting: Physical Medicine & Rehabilitation

## 2011-03-24 DIAGNOSIS — I10 Essential (primary) hypertension: Secondary | ICD-10-CM | POA: Insufficient documentation

## 2011-03-24 DIAGNOSIS — M4714 Other spondylosis with myelopathy, thoracic region: Secondary | ICD-10-CM | POA: Insufficient documentation

## 2011-03-24 DIAGNOSIS — N319 Neuromuscular dysfunction of bladder, unspecified: Secondary | ICD-10-CM | POA: Insufficient documentation

## 2011-03-24 DIAGNOSIS — G8220 Paraplegia, unspecified: Secondary | ICD-10-CM

## 2011-03-24 DIAGNOSIS — C720 Malignant neoplasm of spinal cord: Secondary | ICD-10-CM

## 2011-03-24 DIAGNOSIS — R350 Frequency of micturition: Secondary | ICD-10-CM | POA: Insufficient documentation

## 2011-03-24 DIAGNOSIS — R209 Unspecified disturbances of skin sensation: Secondary | ICD-10-CM | POA: Insufficient documentation

## 2011-03-24 DIAGNOSIS — Z9889 Other specified postprocedural states: Secondary | ICD-10-CM | POA: Insufficient documentation

## 2011-03-24 NOTE — Assessment & Plan Note (Signed)
Brenda [name] is back regarding her T10 myelopathy, recent right knee arthroscopy.  She has been in an outpatient therapy after her discharge from rehab on February 09, 2011, and has been doing quite well.  Her balance and gait and speech is not improving.  PT is coming at least to the end of this phase of therapy.  She may be running out of visits as well.  Her pain is a 4/10.  It is on the right hip and leg.  She uses Tylenol and hydrocodone fairly effectively.  She asked if she can use more hydrocodone if needed.  She has tingling still in the right leg. Knee still gives her some problems.  Sleep is fair.  She does complain of frequency of urine and has problems especially at nighttime.  Bowel function has been appropriate.  REVIEW OF SYSTEMS:  Notable for the above.  Full 12-point review is in the written health history section of the chart.  SOCIAL HISTORY:  The patient is married.  Living with her husband who is with her today.  PHYSICAL EXAMINATION:  VITAL SIGNS:  Blood pressure is 155/75, pulse 66, respiratory rate 16, she is saturating 99% on room air. GENERAL:  The patient is pleasant.  Alert and oriented x3.  Affect showing bright and appropriate. EXTREMITIES:  She ambulated for me today with her walker and was quite smooth.  We took the walker away and she had some difficulty with right knee control and proprioception and had some difficulty with changes in direction, but then actually was surprisingly stable.  She has diminished pinprick and light touch at 1+ to 2/5 to the right lower extremity.  Knee extension and ankle dorsiflexion, plantar flexion are in the realm of 4 to 4+/5.  Hip flexion is 4 to 4+/5.  Left lower extremity is grossly 4+ to 5/5.  She has good trunk control. NEUROLOGIC:  Cognitively, she is alert and appropriate. HEART:  Regular. CHEST:  Clear. ABDOMEN:  Soft, nontender.  ASSESSMENT: 1. T10 tumor with myelopathy status post T9-T11 laminectomy with  resection. 2. Recent right knee arthroscopy. 3. Hypertension. 4. Neurogenic bladder with frequency.  PLAN: 1. The patient is doing quite well.  She may transition to a home     program.  The [facility] would be great location and we discussed     stationary bike, some use of the treadmill and aquatic     walking/activities.  I would expect her right leg to continue to     improve from a sensory/proprioceptive standpoint as well as     strength.  She is very motivated and making nice progress.  We     could consider resuming PT in the [place] Year if needed. 2. Regarding her bladder, we will start Ditropan 5 mg at bedtime.  I     did collect the UA and C and S today as well.  If she has     persistent issues, may consider Urology followup. 3. Hypertension per family physician essential. 4. I will see her back in 2 months.     Ranelle Oyster, M.D. Electronically Signed    ZTS/MedQ D:  03/24/2011 10:34:05  T:  03/24/2011 10:51:54  Job #:  [id number]  cc:   Geoffry Paradise, M.D. Fax: [phone]  Cristi Loron, M.D. Fax: [phone]

## 2011-03-27 ENCOUNTER — Ambulatory Visit: Payer: Medicare Other | Admitting: Physical Therapy

## 2011-03-29 ENCOUNTER — Ambulatory Visit: Payer: Medicare Other | Admitting: Rehabilitative and Restorative Service Providers"

## 2011-03-31 ENCOUNTER — Ambulatory Visit: Payer: Medicare Other | Admitting: Rehabilitative and Restorative Service Providers"

## 2011-04-11 ENCOUNTER — Ambulatory Visit: Payer: Medicare Other | Admitting: Rehabilitative and Restorative Service Providers"

## 2011-04-13 ENCOUNTER — Ambulatory Visit: Payer: Medicare Other | Admitting: Rehabilitative and Restorative Service Providers"

## 2011-04-19 ENCOUNTER — Ambulatory Visit
Payer: Medicare Other | Attending: Physical Medicine & Rehabilitation | Admitting: Rehabilitative and Restorative Service Providers"

## 2011-04-19 DIAGNOSIS — M6281 Muscle weakness (generalized): Secondary | ICD-10-CM | POA: Insufficient documentation

## 2011-04-19 DIAGNOSIS — R269 Unspecified abnormalities of gait and mobility: Secondary | ICD-10-CM | POA: Insufficient documentation

## 2011-04-19 DIAGNOSIS — IMO0001 Reserved for inherently not codable concepts without codable children: Secondary | ICD-10-CM | POA: Insufficient documentation

## 2011-04-26 ENCOUNTER — Ambulatory Visit: Payer: Medicare Other | Admitting: Rehabilitative and Restorative Service Providers"

## 2011-05-03 ENCOUNTER — Ambulatory Visit: Payer: Medicare Other | Admitting: Rehabilitative and Restorative Service Providers"

## 2011-05-11 ENCOUNTER — Ambulatory Visit: Payer: Medicare Other | Admitting: Rehabilitative and Restorative Service Providers"

## 2011-05-12 ENCOUNTER — Ambulatory Visit: Payer: Medicare Other | Admitting: Rehabilitative and Restorative Service Providers"

## 2011-05-23 ENCOUNTER — Encounter: Payer: Medicare Other | Attending: Physical Medicine & Rehabilitation | Admitting: Physical Medicine & Rehabilitation

## 2011-05-23 DIAGNOSIS — G8220 Paraplegia, unspecified: Secondary | ICD-10-CM

## 2011-05-23 DIAGNOSIS — N319 Neuromuscular dysfunction of bladder, unspecified: Secondary | ICD-10-CM

## 2011-05-23 DIAGNOSIS — N3289 Other specified disorders of bladder: Secondary | ICD-10-CM | POA: Insufficient documentation

## 2011-05-23 DIAGNOSIS — Z9889 Other specified postprocedural states: Secondary | ICD-10-CM | POA: Insufficient documentation

## 2011-05-23 DIAGNOSIS — S24103A Unspecified injury at T7-T10 level of thoracic spinal cord, initial encounter: Secondary | ICD-10-CM | POA: Insufficient documentation

## 2011-05-23 DIAGNOSIS — X58XXXA Exposure to other specified factors, initial encounter: Secondary | ICD-10-CM | POA: Insufficient documentation

## 2011-05-23 DIAGNOSIS — D492 Neoplasm of unspecified behavior of bone, soft tissue, and skin: Secondary | ICD-10-CM | POA: Insufficient documentation

## 2011-05-23 DIAGNOSIS — I10 Essential (primary) hypertension: Secondary | ICD-10-CM | POA: Insufficient documentation

## 2011-05-23 DIAGNOSIS — M4714 Other spondylosis with myelopathy, thoracic region: Secondary | ICD-10-CM | POA: Insufficient documentation

## 2011-05-23 DIAGNOSIS — C720 Malignant neoplasm of spinal cord: Secondary | ICD-10-CM

## 2011-05-23 NOTE — Assessment & Plan Note (Signed)
Brenda [name] is back regarding her T10 myelopathy and incomplete spinal cord injury.  She has actually been doing quite well, they last saw her. Outpatient PT is slowly tapering off therapies.  She is going to the [facility], doing some water activities, trying to walk a bit at home as well. She uses her rolling walker for most ambulation, but does walk without a device at home occasionally with her cane also.  She does have some tingling still on her feet, but this is slowly improving.  She reports still some more weakness in the right leg than the left.  She still reports some bladder spasms.  We checked a urinalysis and culture at her last visit which was negative.  She tells me Dr. Jacky Kindle recently put her on Cipro for a suspect UTI.  She is having frequency particularly at nighttime.  The Ditropan did help somewhat, however, does also cause some dry mouth.  REVIEW OF SYSTEMS:  Notable for the above.  Full 12-point review is in the written health and history section of the chart.  SOCIAL HISTORY:  Unchanged.  The patient is married and living with her husband.  Family is with her today and supportive as always.  PHYSICAL EXAMINATION:  VITAL SIGNS:  Blood pressure is 144/75, pulse 80, respiratory rate is 16 and she is satting 96% on room air. GENERAL:  The patient is pleasant and alert.  She is in good mental spirits today. EXTREMITIES:  Upper extremity strength is 5/5.  Lower extremity grossly 4-5/5.  She still has a bit of weakness with ankle dorsiflexion, plantar flexion and has diminished proprioception still on the right more than the left legs.  She does sense pain and light touch at 1-1+/2.  In walking with her row later today, she was quite competent with good weight shift and balance and without the walker she tends to lurch a bit and particularly stagger occasionally on the right leg.  Reflexes are 2++in each leg.  She has no resting tone. NEUROLOGIC:  Cognitively, she is alert and  appropriate. HEART:  Regular. CHEST:  Clear. ABDOMEN:  Soft and nontender.  ASSESSMENT: 1. T10 tumor with myelopathy, status post T9-T11 laminectomy and     resection. 2. Recent right knee arthroscopic surgery. 3. Hypertension. 4. Neurogenic bladder with spastic bladder picture.  PLAN: 1. I made a referral to [facility] for assessment and     recommendations regarding her spastic bladder.  This has been a     persistent issue that the patient would like to be assessed     further.  She will restart the Ditropan for now (and continue Cipro     per Dr. Lanell Matar recommendations). 2. Continue with outpatient PT with taper to off.  She will also work     on order activities, and I encouraged water walking and exercises     to tolerance.  She remains quite motivated. 3. I gave her permission to drive after trial drives with her family.     Recommended  starting in a parking lot and moving to low traffic     times with someone in the car for supervision.  She displays enough     motor control and     proprioception in driving in my opinion at least for local, daytime     distances. 4. I will see her back in 4 months.  Overall, she has made a very nice     progress.     Lamonte Richer.  Riley Kill, M.D. Electronically Signed    ZTS/MedQ D:  05/23/2011 12:03:59  T:  05/23/2011 21:41:16  Job #:  [id number]  cc:   Geoffry Paradise, M.D. Fax: [phone]

## 2011-05-25 ENCOUNTER — Ambulatory Visit
Payer: Medicare Other | Attending: Physical Medicine & Rehabilitation | Admitting: Rehabilitative and Restorative Service Providers"

## 2011-05-25 DIAGNOSIS — M6281 Muscle weakness (generalized): Secondary | ICD-10-CM | POA: Insufficient documentation

## 2011-05-25 DIAGNOSIS — R269 Unspecified abnormalities of gait and mobility: Secondary | ICD-10-CM | POA: Insufficient documentation

## 2011-05-25 DIAGNOSIS — IMO0001 Reserved for inherently not codable concepts without codable children: Secondary | ICD-10-CM | POA: Insufficient documentation

## 2011-06-01 ENCOUNTER — Ambulatory Visit: Payer: Medicare Other | Admitting: Rehabilitative and Restorative Service Providers"

## 2011-06-08 ENCOUNTER — Ambulatory Visit: Payer: Medicare Other | Admitting: Rehabilitative and Restorative Service Providers"

## 2011-06-15 ENCOUNTER — Ambulatory Visit: Payer: Medicare Other | Admitting: Rehabilitative and Restorative Service Providers"

## 2011-06-22 ENCOUNTER — Ambulatory Visit: Payer: Medicare Other | Admitting: Rehabilitative and Restorative Service Providers"

## 2011-06-29 ENCOUNTER — Ambulatory Visit: Payer: Medicare Other | Admitting: Rehabilitative and Restorative Service Providers"

## 2011-07-06 ENCOUNTER — Ambulatory Visit: Payer: Medicare Other | Admitting: Rehabilitative and Restorative Service Providers"

## 2011-07-13 ENCOUNTER — Ambulatory Visit: Payer: Medicare Other | Admitting: Rehabilitative and Restorative Service Providers"

## 2011-08-16 ENCOUNTER — Encounter: Payer: Self-pay | Admitting: Physical Medicine & Rehabilitation

## 2011-08-29 ENCOUNTER — Encounter: Payer: Medicare Other | Admitting: Physical Medicine & Rehabilitation

## 2011-10-10 ENCOUNTER — Encounter: Payer: Self-pay | Admitting: Physical Medicine & Rehabilitation

## 2011-10-10 ENCOUNTER — Encounter: Payer: Medicare Other | Attending: Physical Medicine & Rehabilitation | Admitting: Physical Medicine & Rehabilitation

## 2011-10-10 VITALS — BP 174/87 | HR 71 | Ht 63.0 in | Wt 156.0 lb

## 2011-10-10 DIAGNOSIS — N3289 Other specified disorders of bladder: Secondary | ICD-10-CM | POA: Insufficient documentation

## 2011-10-10 DIAGNOSIS — I10 Essential (primary) hypertension: Secondary | ICD-10-CM | POA: Insufficient documentation

## 2011-10-10 DIAGNOSIS — G992 Myelopathy in diseases classified elsewhere: Secondary | ICD-10-CM | POA: Insufficient documentation

## 2011-10-10 DIAGNOSIS — D497 Neoplasm of unspecified behavior of endocrine glands and other parts of nervous system: Secondary | ICD-10-CM | POA: Insufficient documentation

## 2011-10-10 DIAGNOSIS — S24103A Unspecified injury at T7-T10 level of thoracic spinal cord, initial encounter: Secondary | ICD-10-CM

## 2011-10-10 DIAGNOSIS — N319 Neuromuscular dysfunction of bladder, unspecified: Secondary | ICD-10-CM | POA: Insufficient documentation

## 2011-10-10 DIAGNOSIS — D492 Neoplasm of unspecified behavior of bone, soft tissue, and skin: Secondary | ICD-10-CM | POA: Insufficient documentation

## 2011-10-10 NOTE — Progress Notes (Signed)
Subjective:    Patient ID: Brenda [name], female    DOB: [birth year], 78 y.o.   MRN: [id number]  HPI  Brenda [name] is back regarding her thoracic tumor with resulting T10 SCI. She's on trimpex for UTI prophylaxis. She feels that she is continuing to improve. She has been working out at [employer]. She uses the pool to exercise. She generally walks in her shoes. She does experience fatigue at times when she really "over does it".     Pain Inventory Average Pain 2 Pain Right Now 2 My pain is dull  In the last 24 hours, has pain interfered with the following? General activity 0 Relation with others 0 Enjoyment of life 0 What TIME of day is your pain at its worst? morning Sleep (in general) Fair  Pain is worse with: sitting and inactivity Pain improves with: rest and pacing activities Relief from Meds: 7  Mobility use a cane how many minutes can you walk? 10 ability to climb steps?  yes do you drive?  yes  Function retired I need assistance with the following:  household duties  Neuro/Psych No problems in this area  Prior Studies Any changes since last visit?  no  Physicians involved in your care Any changes since last visit?  no   Family History  Problem Relation Age of Onset  . Arthritis Mother   . Cancer Father    History   Social History  . Marital Status: Married    Spouse Name: N/A    Number of Children: N/A  . Years of Education: N/A   Social History Main Topics  . Smoking status: None  . Smokeless tobacco: None  . Alcohol Use: None  . Drug Use: None  . Sexually Active: None   Other Topics Concern  . None   Social History Narrative  . None   Past Surgical History  Procedure Date  . Spine surgery   . Carpal tunnel release    Past Medical History  Diagnosis Date  . Hypertension    There were no vitals taken for this visit.      Review of Systems  All other systems reviewed and are negative.       Objective:   Physical Exam    Constitutional: She is oriented to person, place, and time. She appears well-developed and well-nourished.  HENT:  Head: Normocephalic and atraumatic.  Mouth/Throat: Oropharynx is clear and moist.  Eyes: Conjunctivae and EOM are normal. Pupils are equal, round, and reactive to light.  Neck: Normal range of motion. Neck supple.  Cardiovascular: Normal rate and regular rhythm.   Pulmonary/Chest: Effort normal and breath sounds normal. No respiratory distress. She has no wheezes.  Abdominal: Soft. She exhibits no distension. There is no tenderness.  Musculoskeletal: Normal range of motion.  Neurological: She is alert and oriented to person, place, and time. She has normal strength. A sensory deficit is present. No cranial nerve deficit.       t10 sensory levels.  Slightly wide based gait. Uses cane for balance. Balance is excellent. Takes extra time to change directions.   Psychiatric: She has a normal mood and affect. Her behavior is normal. Judgment and thought content normal.          Assessment & Plan:  ASSESSMENT:  1. T10 tumor with myelopathy, status post T9-T11 laminectomy and  resection.  2. Recent right knee arthroscopic surgery.  3. Hypertension.  4. Neurogenic bladder with spastic bladder picture.  PLAN:  1. She has made remarkable progress. She should continue with HEP. I encouraged walking in the pool and use of a treadmill to improve gait tolerance and balance. I recommended using her cane for now around the house.  She should use her shoes for walking most of the time as well. 2. Continue trimethroprim for UTI prophylaxis 3. She may call me with any questions. I'll see her back on an as needed basis.

## 2011-10-10 NOTE — Patient Instructions (Signed)
Get to the pool if you can to work on your strength and balance.

## 2012-10-22 IMAGING — CR DG CHEST 2V
1 series · 1 of 1 positions shown · non-contrast
Comparison: None.

CLINICAL DATA: Preop for back surgery.  Transverse myelitis.
Hypertension.  No chest complaints.

CHEST - 2 VIEW

[w chest lat]
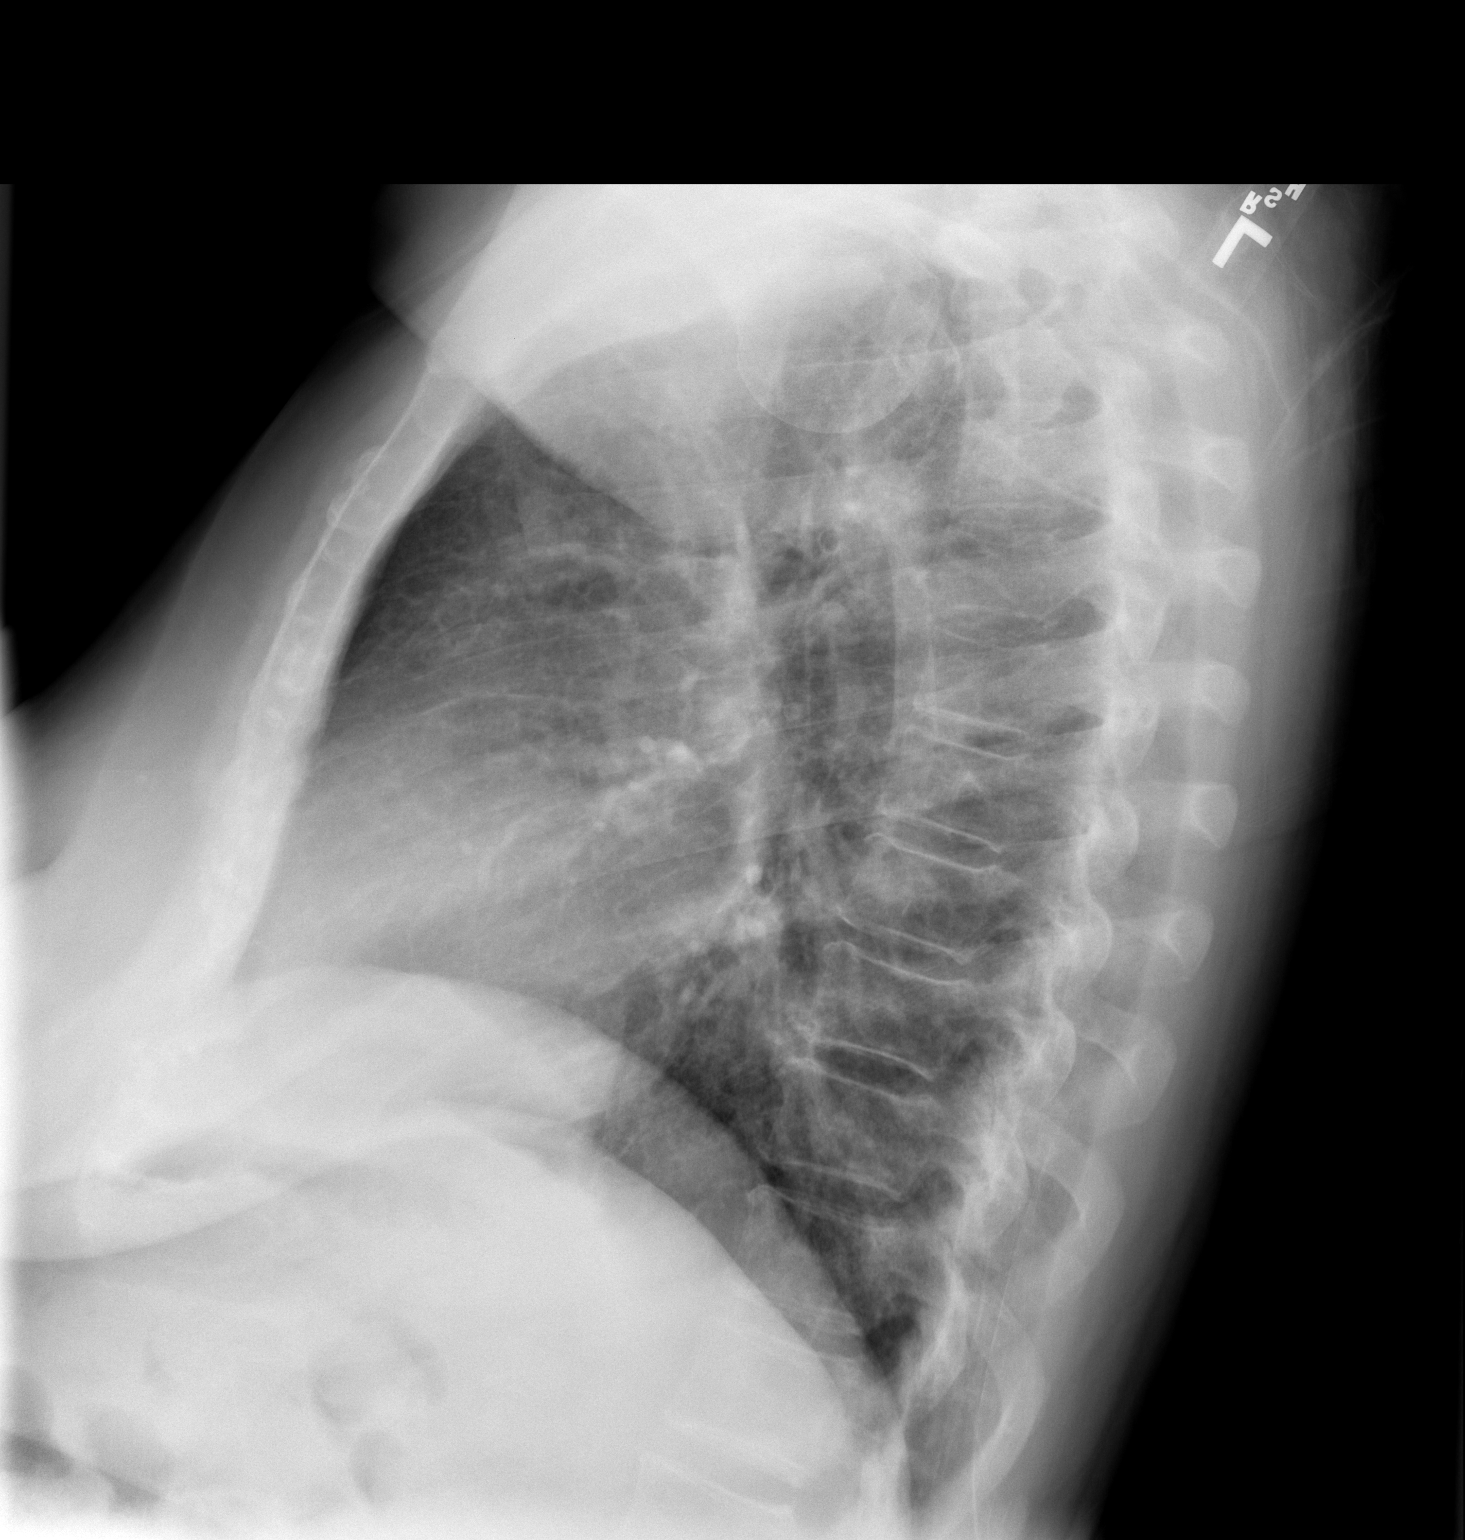

[1 of 1 positions shown; findings below may reference images not displayed]

FINDINGS: Lateral view degraded by patient arm position.

The lateral view is mildly obliqued.  Midline trachea.   Normal
heart size for level of inspiration.  No pleural effusion or
pneumothorax.  Clear lungs.
IMPRESSION: No acute cardiopulmonary disease.

## 2014-10-07 ENCOUNTER — Other Ambulatory Visit: Payer: Self-pay | Admitting: Neurosurgery

## 2014-10-20 ENCOUNTER — Encounter (HOSPITAL_COMMUNITY)
Admission: RE | Admit: 2014-10-20 | Discharge: 2014-10-20 | Disposition: A | Discharge status: Home / Self Care | Payer: Medicare Other | Source: Ambulatory Visit | Attending: Neurosurgery | Admitting: Neurosurgery

## 2014-10-20 ENCOUNTER — Encounter (HOSPITAL_COMMUNITY): Payer: Self-pay

## 2014-10-20 DIAGNOSIS — M4310 Spondylolisthesis, site unspecified: Secondary | ICD-10-CM | POA: Diagnosis not present

## 2014-10-20 DIAGNOSIS — Z01818 Encounter for other preprocedural examination: Secondary | ICD-10-CM | POA: Diagnosis not present

## 2014-10-20 HISTORY — DX: Gastro-esophageal reflux disease without esophagitis: K21.9

## 2014-10-20 HISTORY — DX: Frequency of micturition: R35.0

## 2014-10-20 LAB — SURGICAL PCR SCREEN
MRSA, PCR: NEGATIVE
STAPHYLOCOCCUS AUREUS: NEGATIVE

## 2014-10-20 LAB — BASIC METABOLIC PANEL
Anion gap: 12 (ref 5–15)
BUN: 15 mg/dL (ref 6–20)
CO2: 24 mmol/L (ref 22–32)
Calcium: 9.1 mg/dL (ref 8.9–10.3)
Chloride: 100 mmol/L — ABNORMAL LOW (ref 101–111)
Creatinine, Ser: 0.94 mg/dL (ref 0.44–1.00)
GFR calc Af Amer: >60 mL/min (ref >60)
GFR calc non Af Amer: 55 mL/min — ABNORMAL LOW (ref >60)
GLUCOSE: 102 mg/dL — AB (ref 65–99)
Potassium: 3.8 mmol/L (ref 3.5–5.1)
SODIUM: 136 mmol/L (ref 135–145)

## 2014-10-20 LAB — TYPE AND SCREEN
ABO/RH(D): O POS
ANTIBODY SCREEN: NEGATIVE

## 2014-10-20 LAB — CBC
HCT: 42.8 % (ref 36.0–46.0)
Hemoglobin: 14.5 g/dL (ref 12.0–15.0)
MCH: 30.1 pg (ref 26.0–34.0)
MCHC: 33.9 g/dL (ref 30.0–36.0)
MCV: 89 fL (ref 78.0–100.0)
PLATELETS: 263 10*3/uL (ref 150–400)
RBC: 4.81 MIL/uL (ref 3.87–5.11)
RDW: 14.2 % (ref 11.5–15.5)
WBC: 11.4 10*3/uL — ABNORMAL HIGH (ref 4.0–10.5)

## 2014-10-20 NOTE — Progress Notes (Signed)
Ekg requested from Dr. Jacquiline Doe office.

## 2014-10-20 NOTE — Pre-Procedure Instructions (Signed)
    Ilma TYREA FROBERG  10/20/2014      [id number] [place], [place] [address] [place] [address] Phone: [phone] Fax: [phone]    Your procedure is scheduled on 10-28-2014   Wednesday   Report to [facility] Admitting  At  6:30 AM   Call this number if you have problems the morning of surgery:  [phone]   Remember:  Do not eat food or drink liquids after midnight.   Take these medicines the morning of surgery with A SIP OF WATER Allopurinol   Do not wear jewelry, make-up or nail polish.   Do not wear lotions, powders, or perfumes.    Do not shave 48 hours prior to surgery.     Do not bring valuables to the hospital.  [facility] is not responsible for any belongings or valuables.  Contacts, dentures or bridgework may not be worn into surgery.  Leave your suitcase in the car.  After surgery it may be brought to your room.  For patients admitted to the hospital, discharge time will be determined by your treatment team.  .    Special instructions:  See attached Sheet for instructions on CHG shower  Please read over the following fact sheets that you were given. Pain Booklet, Blood Transfusion Information and Surgical Site Infection Prevention

## 2014-10-21 NOTE — Progress Notes (Signed)
Anesthesia Chart Review:  Pt is 81 year old female scheduled for L4-5 fusion with L2-3 and L3-4 laminectomies on 10/28/2014 with Dr. Arnoldo Morale.   PCP is Dr. Reynaldo Minium.   PMH includes: HTN, GERD. Never smoker. BMI 29  Medications include: lisinopril, prilosec.  Preoperative labs reviewed.    EKG 11/18/2013: Sinus rhythm. Lateral ST-T changes may be due to myocardial ischemia. Dr. Reynaldo Minium (whose office performed EKG) notes these changes are chronic, no acute change. When compared to EKG in our system dated 01/27/2011, no significant changes are apparent.   Reviewed EKG with Dr. Marcie Bal. Although appears stable, most recent EKG is almost a year old. Will need to repeat DOS. If stable, I anticipate she can proceed as scheduled.   Willeen Cass, FNP-BC [facility] Stay [facility]/Anesthesiology Phone: [phone] 10/21/2014 3:56 PM

## 2014-10-28 ENCOUNTER — Encounter (HOSPITAL_COMMUNITY): Payer: Self-pay | Admitting: Anesthesiology

## 2014-10-28 ENCOUNTER — Inpatient Hospital Stay (HOSPITAL_COMMUNITY): Payer: Medicare Other

## 2014-10-28 ENCOUNTER — Inpatient Hospital Stay (HOSPITAL_COMMUNITY)
Admission: RE | Admit: 2014-10-28 | Discharge: 2014-11-02 | DRG: 460 | Disposition: A | Discharge status: Skilled Nursing Facility | Payer: Medicare Other | Source: Ambulatory Visit | Attending: Neurosurgery | Admitting: Neurosurgery

## 2014-10-28 ENCOUNTER — Inpatient Hospital Stay (HOSPITAL_COMMUNITY): Payer: Medicare Other | Admitting: Emergency Medicine

## 2014-10-28 ENCOUNTER — Encounter (HOSPITAL_COMMUNITY)
Admission: RE | Disposition: A | Discharge status: Skilled Nursing Facility | Payer: Medicare Other | Source: Ambulatory Visit | Attending: Neurosurgery

## 2014-10-28 ENCOUNTER — Inpatient Hospital Stay (HOSPITAL_COMMUNITY): Payer: Medicare Other | Admitting: Anesthesiology

## 2014-10-28 DIAGNOSIS — M4806 Spinal stenosis, lumbar region: Secondary | ICD-10-CM | POA: Diagnosis present

## 2014-10-28 DIAGNOSIS — M479 Spondylosis, unspecified: Secondary | ICD-10-CM | POA: Diagnosis present

## 2014-10-28 DIAGNOSIS — M419 Scoliosis, unspecified: Secondary | ICD-10-CM | POA: Diagnosis present

## 2014-10-28 DIAGNOSIS — M4326 Fusion of spine, lumbar region: Secondary | ICD-10-CM

## 2014-10-28 DIAGNOSIS — Z79899 Other long term (current) drug therapy: Secondary | ICD-10-CM

## 2014-10-28 DIAGNOSIS — R339 Retention of urine, unspecified: Secondary | ICD-10-CM | POA: Diagnosis not present

## 2014-10-28 DIAGNOSIS — R509 Fever, unspecified: Secondary | ICD-10-CM | POA: Diagnosis not present

## 2014-10-28 DIAGNOSIS — M5116 Intervertebral disc disorders with radiculopathy, lumbar region: Secondary | ICD-10-CM | POA: Diagnosis present

## 2014-10-28 DIAGNOSIS — M4316 Spondylolisthesis, lumbar region: Principal | ICD-10-CM | POA: Diagnosis present

## 2014-10-28 DIAGNOSIS — Z01812 Encounter for preprocedural laboratory examination: Secondary | ICD-10-CM

## 2014-10-28 DIAGNOSIS — K219 Gastro-esophageal reflux disease without esophagitis: Secondary | ICD-10-CM | POA: Diagnosis present

## 2014-10-28 DIAGNOSIS — Z85828 Personal history of other malignant neoplasm of skin: Secondary | ICD-10-CM

## 2014-10-28 DIAGNOSIS — I10 Essential (primary) hypertension: Secondary | ICD-10-CM | POA: Diagnosis present

## 2014-10-28 HISTORY — PX: POSTERIOR LAMINECTOMY / DECOMPRESSION LUMBAR SPINE: SUR740

## 2014-10-28 SURGERY — POSTERIOR LUMBAR FUSION 1 WITH HARDWARE REMOVAL
Anesthesia: General | Site: Back

## 2014-10-28 MED ORDER — LIDOCAINE HCL (CARDIAC) 20 MG/ML IV SOLN
INTRAVENOUS | Status: DC | PRN
Start: 2014-10-28 — End: 2014-10-28
  Administered 2014-10-28: 50 mg via INTRAVENOUS

## 2014-10-28 MED ORDER — NEOSTIGMINE METHYLSULFATE 10 MG/10ML IV SOLN
INTRAVENOUS | Status: AC
  Filled 2014-10-28: qty 1

## 2014-10-28 MED ORDER — ACETAMINOPHEN 325 MG PO TABS
650.0000 mg | ORAL_TABLET | ORAL | Status: DC | PRN
  Administered 2014-11-02: 650 mg via ORAL
  Filled 2014-10-28: qty 2

## 2014-10-28 MED ORDER — OXYCODONE-ACETAMINOPHEN 5-325 MG PO TABS
ORAL_TABLET | ORAL | Status: AC
  Filled 2014-10-28: qty 1

## 2014-10-28 MED ORDER — PHENOL 1.4 % MT LIQD: 1.0000 | OROMUCOSAL | Status: DC | PRN

## 2014-10-28 MED ORDER — VECURONIUM BROMIDE 10 MG IV SOLR
INTRAVENOUS | Status: AC
  Filled 2014-10-28: qty 10

## 2014-10-28 MED ORDER — GLYCOPYRROLATE 0.2 MG/ML IJ SOLN
INTRAMUSCULAR | Status: AC
  Filled 2014-10-28: qty 3

## 2014-10-28 MED ORDER — LISINOPRIL 20 MG PO TABS
20.0000 mg | ORAL_TABLET | Freq: Every day | ORAL | Status: DC
  Administered 2014-10-28 – 2014-11-02 (×6): 20 mg via ORAL
  Filled 2014-10-28 (×7): qty 1

## 2014-10-28 MED ORDER — HYDROCODONE-ACETAMINOPHEN 5-325 MG PO TABS
1.0000 | ORAL_TABLET | ORAL | Status: DC | PRN
  Administered 2014-10-28 (×2): 2 via ORAL
  Administered 2014-10-29: 1 via ORAL
  Administered 2014-10-29 – 2014-10-31 (×13): 2 via ORAL
  Administered 2014-11-01 – 2014-11-02 (×8): 1 via ORAL
  Filled 2014-10-28: qty 2
  Filled 2014-10-28: qty 1
  Filled 2014-10-28 (×3): qty 2
  Filled 2014-10-28: qty 1
  Filled 2014-10-28 (×3): qty 2
  Filled 2014-10-28: qty 1
  Filled 2014-10-28: qty 2
  Filled 2014-10-28 (×2): qty 1
  Filled 2014-10-28 (×2): qty 2
  Filled 2014-10-28: qty 1
  Filled 2014-10-28 (×5): qty 2
  Filled 2014-10-28 (×3): qty 1
  Filled 2014-10-28: qty 2

## 2014-10-28 MED ORDER — MIDAZOLAM HCL 2 MG/2ML IJ SOLN
INTRAMUSCULAR | Status: AC
  Filled 2014-10-28: qty 2

## 2014-10-28 MED ORDER — LACTATED RINGERS IV SOLN
INTRAVENOUS | Status: DC
Start: 2014-10-28 — End: 2014-11-02

## 2014-10-28 MED ORDER — ADULT MULTIVITAMIN W/MINERALS CH
1.0000 | ORAL_TABLET | Freq: Every day | ORAL | Status: DC
  Administered 2014-10-29 – 2014-11-02 (×5): 1 via ORAL
  Filled 2014-10-28 (×6): qty 1

## 2014-10-28 MED ORDER — 0.9 % SODIUM CHLORIDE (POUR BTL) OPTIME
TOPICAL | Status: DC | PRN
  Administered 2014-10-28: 1000 mL

## 2014-10-28 MED ORDER — MORPHINE SULFATE 2 MG/ML IJ SOLN
1.0000 mg | INTRAMUSCULAR | Status: DC | PRN
  Administered 2014-10-28: 2 mg via INTRAVENOUS
  Filled 2014-10-28: qty 1

## 2014-10-28 MED ORDER — BUPIVACAINE LIPOSOME 1.3 % IJ SUSP
20.0000 mL | INTRAMUSCULAR | Status: AC
  Filled 2014-10-28: qty 20

## 2014-10-28 MED ORDER — LACTATED RINGERS IV SOLN
INTRAVENOUS | Status: DC | PRN
  Administered 2014-10-28 (×3): via INTRAVENOUS

## 2014-10-28 MED ORDER — FENTANYL CITRATE (PF) 250 MCG/5ML IJ SOLN
INTRAMUSCULAR | Status: AC
  Filled 2014-10-28: qty 5

## 2014-10-28 MED ORDER — ONE-DAILY MULTI VITAMINS PO TABS: 1.0000 | ORAL_TABLET | Freq: Every day | ORAL | Status: DC

## 2014-10-28 MED ORDER — BUPIVACAINE-EPINEPHRINE (PF) 0.5% -1:200000 IJ SOLN
INTRAMUSCULAR | Status: DC | PRN
  Administered 2014-10-28: 10 mL via PERINEURAL

## 2014-10-28 MED ORDER — VECURONIUM BROMIDE 10 MG IV SOLR
INTRAVENOUS | Status: DC | PRN
  Administered 2014-10-28 (×2): 2 mg via INTRAVENOUS
  Administered 2014-10-28: 1 mg via INTRAVENOUS

## 2014-10-28 MED ORDER — ACETAMINOPHEN 650 MG RE SUPP: 650.0000 mg | RECTAL | Status: DC | PRN

## 2014-10-28 MED ORDER — BACITRACIN ZINC 500 UNIT/GM EX OINT
TOPICAL_OINTMENT | CUTANEOUS | Status: DC | PRN
  Administered 2014-10-28: 1 via TOPICAL

## 2014-10-28 MED ORDER — ONDANSETRON HCL 4 MG/2ML IJ SOLN: 4.0000 mg | Freq: Once | INTRAMUSCULAR | Status: DC | PRN

## 2014-10-28 MED ORDER — ONDANSETRON HCL 4 MG/2ML IJ SOLN
INTRAMUSCULAR | Status: DC | PRN
  Administered 2014-10-28: 4 mg via INTRAVENOUS

## 2014-10-28 MED ORDER — OXYCODONE-ACETAMINOPHEN 5-325 MG PO TABS
1.0000 | ORAL_TABLET | ORAL | Status: DC | PRN
  Administered 2014-10-28 – 2014-11-01 (×3): 1 via ORAL
  Filled 2014-10-28 (×2): qty 1
  Filled 2014-10-28: qty 2

## 2014-10-28 MED ORDER — ALBUMIN HUMAN 5 % IV SOLN
INTRAVENOUS | Status: DC | PRN
  Administered 2014-10-28: 12:00:00 via INTRAVENOUS

## 2014-10-28 MED ORDER — ALUM & MAG HYDROXIDE-SIMETH 200-200-20 MG/5ML PO SUSP: 30.0000 mL | Freq: Four times a day (QID) | ORAL | Status: DC | PRN

## 2014-10-28 MED ORDER — ONDANSETRON HCL 4 MG/2ML IJ SOLN: 4.0000 mg | INTRAMUSCULAR | Status: DC | PRN

## 2014-10-28 MED ORDER — LABETALOL HCL 5 MG/ML IV SOLN
5.0000 mg | Freq: Once | INTRAVENOUS | Status: AC
  Administered 2014-10-28: 5 mg via INTRAVENOUS

## 2014-10-28 MED ORDER — CEFAZOLIN SODIUM-DEXTROSE 2-3 GM-% IV SOLR
2.0000 g | Freq: Three times a day (TID) | INTRAVENOUS | Status: AC
  Administered 2014-10-28 (×2): 2 g via INTRAVENOUS
  Filled 2014-10-28 (×2): qty 50

## 2014-10-28 MED ORDER — VANCOMYCIN HCL 1000 MG IV SOLR
INTRAVENOUS | Status: DC | PRN
  Administered 2014-10-28: 1000 mg

## 2014-10-28 MED ORDER — MIDAZOLAM HCL 5 MG/5ML IJ SOLN
INTRAMUSCULAR | Status: DC | PRN
  Administered 2014-10-28: 2 mg via INTRAVENOUS

## 2014-10-28 MED ORDER — FENTANYL CITRATE (PF) 100 MCG/2ML IJ SOLN
INTRAMUSCULAR | Status: AC
  Administered 2014-10-28: 50 ug via INTRAVENOUS
  Filled 2014-10-28: qty 2

## 2014-10-28 MED ORDER — DIAZEPAM 5 MG PO TABS
ORAL_TABLET | ORAL | Status: AC
Start: 2014-10-28 — End: 2014-10-28
  Administered 2014-10-28: 5 mg via ORAL
  Filled 2014-10-28: qty 1

## 2014-10-28 MED ORDER — BUPIVACAINE LIPOSOME 1.3 % IJ SUSP
INTRAMUSCULAR | Status: DC | PRN
  Administered 2014-10-28: 20 mL

## 2014-10-28 MED ORDER — CEFAZOLIN SODIUM-DEXTROSE 2-3 GM-% IV SOLR
2.0000 g | INTRAVENOUS | Status: AC
  Administered 2014-10-28: 2 g via INTRAVENOUS
  Filled 2014-10-28: qty 50

## 2014-10-28 MED ORDER — SODIUM CHLORIDE 0.9 % IV SOLN
INTRAVENOUS | Status: DC | PRN
  Administered 2014-10-28: 13:00:00 via INTRAVENOUS

## 2014-10-28 MED ORDER — ALLOPURINOL 100 MG PO TABS
300.0000 mg | ORAL_TABLET | Freq: Every day | ORAL | Status: DC
  Administered 2014-10-28 – 2014-11-02 (×6): 300 mg via ORAL
  Filled 2014-10-28 (×2): qty 3
  Filled 2014-10-28 (×4): qty 1
  Filled 2014-10-28: qty 3

## 2014-10-28 MED ORDER — NEOSTIGMINE METHYLSULFATE 10 MG/10ML IV SOLN
INTRAVENOUS | Status: DC | PRN
  Administered 2014-10-28: 4 mg via INTRAVENOUS

## 2014-10-28 MED ORDER — ARTIFICIAL TEARS OP OINT
TOPICAL_OINTMENT | OPHTHALMIC | Status: AC
  Filled 2014-10-28: qty 3.5

## 2014-10-28 MED ORDER — MENTHOL 3 MG MT LOZG: 1.0000 | LOZENGE | OROMUCOSAL | Status: DC | PRN

## 2014-10-28 MED ORDER — DIAZEPAM 5 MG PO TABS
5.0000 mg | ORAL_TABLET | Freq: Four times a day (QID) | ORAL | Status: DC | PRN
  Administered 2014-10-28 – 2014-10-30 (×4): 5 mg via ORAL
  Filled 2014-10-28 (×4): qty 1

## 2014-10-28 MED ORDER — PROPOFOL 10 MG/ML IV BOLUS
INTRAVENOUS | Status: DC | PRN
  Administered 2014-10-28: 150 mg via INTRAVENOUS

## 2014-10-28 MED ORDER — FENTANYL CITRATE (PF) 100 MCG/2ML IJ SOLN
INTRAMUSCULAR | Status: DC | PRN
  Administered 2014-10-28: 100 ug via INTRAVENOUS
  Administered 2014-10-28: 150 ug via INTRAVENOUS
  Administered 2014-10-28 (×3): 50 ug via INTRAVENOUS

## 2014-10-28 MED ORDER — OMEPRAZOLE MAGNESIUM 20 MG PO TBEC: 20.0000 mg | DELAYED_RELEASE_TABLET | ORAL | Status: DC | PRN

## 2014-10-28 MED ORDER — EPHEDRINE SULFATE 50 MG/ML IJ SOLN
INTRAMUSCULAR | Status: DC | PRN
  Administered 2014-10-28: 10 mg via INTRAVENOUS

## 2014-10-28 MED ORDER — ROCURONIUM BROMIDE 100 MG/10ML IV SOLN
INTRAVENOUS | Status: DC | PRN
  Administered 2014-10-28: 50 mg via INTRAVENOUS

## 2014-10-28 MED ORDER — VANCOMYCIN HCL 1000 MG IV SOLR
INTRAVENOUS | Status: AC
  Filled 2014-10-28: qty 1000

## 2014-10-28 MED ORDER — PROPOFOL 10 MG/ML IV BOLUS
INTRAVENOUS | Status: AC
  Filled 2014-10-28: qty 20

## 2014-10-28 MED ORDER — FENTANYL CITRATE (PF) 100 MCG/2ML IJ SOLN
25.0000 ug | INTRAMUSCULAR | Status: DC | PRN
Start: 2014-10-28 — End: 2014-10-28
  Administered 2014-10-28 (×3): 50 ug via INTRAVENOUS

## 2014-10-28 MED ORDER — GLYCOPYRROLATE 0.2 MG/ML IJ SOLN
INTRAMUSCULAR | Status: DC | PRN
  Administered 2014-10-28: .7 mg via INTRAVENOUS

## 2014-10-28 MED ORDER — LABETALOL HCL 5 MG/ML IV SOLN
INTRAVENOUS | Status: AC
  Filled 2014-10-28: qty 4

## 2014-10-28 MED ORDER — PANTOPRAZOLE SODIUM 40 MG PO TBEC: 40.0000 mg | DELAYED_RELEASE_TABLET | Freq: Every day | ORAL | Status: DC | PRN

## 2014-10-28 MED ORDER — ROCURONIUM BROMIDE 50 MG/5ML IV SOLN
INTRAVENOUS | Status: AC
  Filled 2014-10-28: qty 1

## 2014-10-28 MED ORDER — BACITRACIN 50000 UNITS IM SOLR
INTRAMUSCULAR | Status: DC | PRN
  Administered 2014-10-28: 500 mL

## 2014-10-28 MED ORDER — ONDANSETRON HCL 4 MG/2ML IJ SOLN
INTRAMUSCULAR | Status: AC
Start: 2014-10-28 — End: 2014-10-28
  Filled 2014-10-28: qty 2

## 2014-10-28 MED ORDER — SURGIFOAM 100 EX MISC
CUTANEOUS | Status: DC | PRN
  Administered 2014-10-28: 20 mL via TOPICAL

## 2014-10-28 SURGICAL SUPPLY — 62 items
APL SKNCLS STERI-STRIP NONHPOA (GAUZE/BANDAGES/DRESSINGS) ×1
BAG DECANTER FOR FLEXI CONT (MISCELLANEOUS) ×3 IMPLANT
BENZOIN TINCTURE PRP APPL 2/3 (GAUZE/BANDAGES/DRESSINGS) ×3 IMPLANT
BLADE CLIPPER SURG (BLADE) IMPLANT
BRUSH SCRUB EZ PLAIN DRY (MISCELLANEOUS) ×3 IMPLANT
BUR MATCHSTICK NEURO 3.0 LAGG (BURR) ×3 IMPLANT
BUR PRECISION FLUTE 6.0 (BURR) ×3 IMPLANT
CANISTER SUCT 3000ML PPV (MISCELLANEOUS) ×3 IMPLANT
CAP REVERE LOCKING (Cap) ×8 IMPLANT
CLOSURE WOUND 1/2 X4 (GAUZE/BANDAGES/DRESSINGS) ×1
CONT SPEC 4OZ CLIKSEAL STRL BL (MISCELLANEOUS) ×3 IMPLANT
COVER BACK TABLE 24X17X13 BIG (DRAPES) IMPLANT
DRAPE C-ARM 42X72 X-RAY (DRAPES) ×6 IMPLANT
DRAPE LAPAROTOMY 100X72X124 (DRAPES) ×3 IMPLANT
DRAPE POUCH INSTRU U-SHP 10X18 (DRAPES) ×3 IMPLANT
DRAPE PROXIMA HALF (DRAPES) ×3 IMPLANT
DRAPE SURG 17X23 STRL (DRAPES) ×12 IMPLANT
ELECT BLADE 4.0 EZ CLEAN MEGAD (MISCELLANEOUS) ×3
ELECT REM PT RETURN 9FT ADLT (ELECTROSURGICAL) ×3
ELECTRODE BLDE 4.0 EZ CLN MEGD (MISCELLANEOUS) ×1 IMPLANT
ELECTRODE REM PT RTRN 9FT ADLT (ELECTROSURGICAL) ×1 IMPLANT
GAUZE SPONGE 4X4 12PLY STRL (GAUZE/BANDAGES/DRESSINGS) ×3 IMPLANT
GAUZE SPONGE 4X4 16PLY XRAY LF (GAUZE/BANDAGES/DRESSINGS) ×3 IMPLANT
GLOVE BIO SURGEON STRL SZ8 (GLOVE) ×6 IMPLANT
GLOVE BIO SURGEON STRL SZ8.5 (GLOVE) ×6 IMPLANT
GLOVE EXAM NITRILE LRG STRL (GLOVE) IMPLANT
GLOVE EXAM NITRILE MD LF STRL (GLOVE) IMPLANT
GLOVE EXAM NITRILE XL STR (GLOVE) IMPLANT
GLOVE EXAM NITRILE XS STR PU (GLOVE) IMPLANT
GOWN STRL REUS W/ TWL LRG LVL3 (GOWN DISPOSABLE) IMPLANT
GOWN STRL REUS W/ TWL XL LVL3 (GOWN DISPOSABLE) ×2 IMPLANT
GOWN STRL REUS W/TWL 2XL LVL3 (GOWN DISPOSABLE) IMPLANT
GOWN STRL REUS W/TWL LRG LVL3 (GOWN DISPOSABLE)
GOWN STRL REUS W/TWL XL LVL3 (GOWN DISPOSABLE) ×6
KIT BASIN OR (CUSTOM PROCEDURE TRAY) ×3 IMPLANT
KIT ROOM TURNOVER OR (KITS) ×3 IMPLANT
NDL HYPO 21X1.5 SAFETY (NEEDLE) IMPLANT
NEEDLE HYPO 21X1.5 SAFETY (NEEDLE) IMPLANT
NEEDLE HYPO 22GX1.5 SAFETY (NEEDLE) ×3 IMPLANT
NS IRRIG 1000ML POUR BTL (IV SOLUTION) ×3 IMPLANT
PACK LAMINECTOMY NEURO (CUSTOM PROCEDURE TRAY) ×3 IMPLANT
PAD ARMBOARD 7.5X6 YLW CONV (MISCELLANEOUS) ×9 IMPLANT
PATTIES SURGICAL .5 X1 (DISPOSABLE) IMPLANT
ROD REVERE 6.35 40MM (Rod) ×4 IMPLANT
SCREW 7.5X45MM (Screw) ×8 IMPLANT
SPACER ALTERA 10X31-8 (Spacer) ×2 IMPLANT
SPONGE LAP 4X18 X RAY DECT (DISPOSABLE) ×2 IMPLANT
SPONGE NEURO XRAY DETECT 1X3 (DISPOSABLE) IMPLANT
SPONGE SURGIFOAM ABS GEL 100 (HEMOSTASIS) ×3 IMPLANT
STRIP BIOACTIVE 20CC 25X100X8 (Miscellaneous) ×2 IMPLANT
STRIP CLOSURE SKIN 1/2X4 (GAUZE/BANDAGES/DRESSINGS) ×2 IMPLANT
SUT VIC AB 1 CT1 18XBRD ANBCTR (SUTURE) ×2 IMPLANT
SUT VIC AB 1 CT1 8-18 (SUTURE) ×6
SUT VIC AB 2-0 CP2 18 (SUTURE) ×6 IMPLANT
SYR 20CC LL (SYRINGE) IMPLANT
SYR 20ML ECCENTRIC (SYRINGE) ×3 IMPLANT
TAPE CLOTH SURG 4X10 WHT LF (GAUZE/BANDAGES/DRESSINGS) ×2 IMPLANT
TAPE STRIPS DRAPE STRL (GAUZE/BANDAGES/DRESSINGS) ×2 IMPLANT
TOWEL OR 17X24 6PK STRL BLUE (TOWEL DISPOSABLE) ×3 IMPLANT
TOWEL OR 17X26 10 PK STRL BLUE (TOWEL DISPOSABLE) ×3 IMPLANT
TRAY FOLEY W/METER SILVER 14FR (SET/KITS/TRAYS/PACK) ×3 IMPLANT
WATER STERILE IRR 1000ML POUR (IV SOLUTION) ×3 IMPLANT

## 2014-10-28 NOTE — Op Note (Signed)
Brief history: the patient is an 81 year old white female who has complained of back, buttock, and leg pain consistent with neurogenic claudication. She has failed medical management and was worked up with a lumbar MRI. This demonstrated the patient had spinal stenosis and scoliosis at multiple levels with a spondylolisthesis at L4-5. I discussed the various treatment options with the patient including surgery. She has weighed the risks, benefits, and alternative surgery and decided to proceed with a lumbar laminectomy with an L4-5 decompression and fusion.  Preoperative diagnosis:  Lumbar stenosis, L4-5 spondylolisthesis,Degenerative disc disease, spinal stenosis compressing bilateral L2, L3, L4 and L5 nerve roots; lumbago; lumbar radiculopathy  Postoperative diagnosis: the same  Procedure: L2, L3 and L4 laminectomy with  bilateral L1 laminotomies/foraminotomies to decompress the bilateral L2, L3, L4 and L5 nerve roots(the work required to do this was in addition to the work required to do the posterior lumbar interbody fusion because of the patient's spinal stenosis, facet arthropathy. Etc. requiring a wide decompression of the nerve roots.); L4-5 posterior lumbar interbody fusion with local morselized autograft bone and Kinnex graft extender; insertion of interbody prosthesis at L4-5(globus peek expandable interbody prosthesis); posterior  nonsegmental instrumentation from  L4 to L5with globus titanium pedicle screws and rods; posterior lateral arthrodesis at L4-5with local morselized autograft bone and Kinnex bone graft extender.  Surgeon: Dr. Earle Gell  Asst.: Dr. Sherley Bounds  Anesthesia: Gen. endotracheal  Estimated blood loss:  300 mL  Drains: One medium Hemovac  Complications: None  Description of procedure: The patient was brought to the operating room by the anesthesia team. General endotracheal anesthesia was induced. The patient was turned to the prone position on the Wilson frame.  The patient's lumbosacral region was then prepared with Betadine scrub and Betadine solution. Sterile drapes were applied.  I then injected the area to be incised with Marcaine with epinephrine solution. I then used the scalpel to make a linear midline incision over the L12, L2-3, L3-4 and L4-5interspace. I then used electrocautery to perform a bilateral subperiosteal dissection exposing the spinous process and lamina of L1-L5. We then obtained intraoperative radiograph to confirm our location. We then inserted the Verstrac retractor to provide exposure. I began decompression by incising the interspinous ligament at L1-2, L2-3, L3-4 and L4-5 with a scalpel. I used the [employer] to remove the spinous process of L2, L3 and L4  as well as the cephalad aspect of the L5 spinous process and the caudal aspect of the L1 spinous process.   I used  the high speed drill to perform laminotomies at L1, L2, L3 and L4 bilaterally. We then used the Kerrison punches to widen the laminotomy and removed the ligamentum flavum at L1-2, L2-3, L3-4 and L4-5. We used the Kerrison punches to remove the medial facets at  L2-3, L3-4 and L4-5. We performed wide foraminotomies about the bilateral L2, L3, L4 and L5 nerve roots completing the decompression.  We now turned our attention to the posterior lumbar interbody fusion. I used a scalpel to incise the intervertebral disc at L4-5 bilaterally. I then performed a partial intervertebral discectomy at L4-5 bilaterallyusing the pituitary forceps. We prepared the vertebral endplates at D6-2 bilaterallyfor the fusion by removing the soft tissues with the curettes. We then used the trial spacers to pick the appropriate sized interbody prosthesis. We prefilled his prosthesis with a combination of local morselized autograft bone that we obtained during the decompression as well as Kinnex bone graft extender. We inserted the prefilled prosthesis  into the interspace at L4-5 and then  expanded the interbody prosthesis.. There was a good snug fit of the prosthesis in the interspace. We then filled and the remainder of the intervertebral disc space with local morselized autograft bone and Kinnex. This completed the posterior lumbar interbody arthrodesis.  We now turned attention to the instrumentation. Under fluoroscopic guidance we cannulated the bilateral L4 and L5pedicles with the bone probe. We then removed the bone probe. We then tapped the pedicle with a 6.72millimeter tap. We then removed the tap. We probed inside the tapped pedicle with a ball probe to rule out cortical breaches. We then inserted a 6.5 x 56millimeter pedicle screw into the  L4 and L5pedicles bilaterally under fluoroscopic guidance. We then palpated along the medial aspect of the pedicles to rule out cortical breaches. There were none. The nerve roots were not injured. We then connected the unilateral pedicle screws with a lordotic rod. We compressed the construct and secured the rod in place with the caps. We then tightened the caps appropriately. This completed the instrumentation from L4-5.  We now turned our attention to the posterior lateral arthrodesis at L4-5. We used the high-speed drill to decorticate the remainder of the facets, pars, transverse process at L4-5. We then applied a combination of local morselized autograft bone and Kinnex bone graft extender over these decorticated posterior lateral structures. This completed the posterior lateral arthrodesis.  We then obtained hemostasis using bipolar electrocautery. We irrigated the wound out with bacitracin solution. We inspected the thecal sac and nerve roots and noted they were well decompressed. We then removed the retractor. I placed vancomycin powder in the wound. We placed a medium Hemovac drain in the epidural space and tunneled out through separate stab wound. We reapproximated patient's thoracolumbar fascia with interrupted #1 Vicryl suture. We  reapproximated patient's subcutaneous tissue with interrupted 2-0 Vicryl suture. The reapproximated patient's skin with Steri-Strips and benzoin. The wound was then coated with bacitracin ointment. A sterile dressing was applied. The drapes were removed. The patient was subsequently returned to the supine position where they were extubated by the anesthesia team. He was then transported to the post anesthesia care unit in stable condition. All sponge instrument and needle counts were reportedly correct at the end of this case.

## 2014-10-28 NOTE — Transfer of Care (Signed)
Immediate Anesthesia Transfer of Care Note  Patient: Brenda [name]  Procedure(s) Performed: Procedure(s) with comments: L45 Fusion w/L23 and L34 laminectomies (N/A) - L45 posterior lumbar interbody fusion with interbody prosthesis posterior lateral arthrodesis and posterior nonsegmental instrumentation with L23 and L34 laminectomies  Patient Location: PACU  Anesthesia Type:General  Level of Consciousness: awake, alert  and oriented  Airway & Oxygen Therapy: Patient Spontanous Breathing and Patient connected to nasal cannula oxygen  Post-op Assessment: Report given to RN and Post -op Vital signs reviewed and stable  Post vital signs: Reviewed and stable  Last Vitals:  Filed Vitals:   10/28/14 0737  BP: 237/70  Pulse: 62  Temp: 36.6 C  Resp: 18    Complications: No apparent anesthesia complications

## 2014-10-28 NOTE — Progress Notes (Signed)
Dr. Linna Caprice made aware of elevated blood pressure, reported that he would see pt once transported upstairs. Pt denies headache, chest pain, shortness of breath or dizziness, reports, "I'm just nervous".

## 2014-10-28 NOTE — H&P (Signed)
Subjective: Patient is a 81 year old white female who has complained of back, buttock, and leg pain consistent with neurogenic claudication. Has failed medical management and was worked up with a lumbar MRI. This demonstrated multilevel spondylosis, stenosis and spondylolisthesis at L4-5. I discussed the various treatment options with the patient. She has decided to proceed with surgery.   Past Medical History  Diagnosis Date  . Hypertension   . Neuromuscular disorder     numbness and tingling legs and feet  . Arthritis   . GERD (gastroesophageal reflux disease)   . Frequent urination     also during the night  . Depression   . Cancer     basal cell skin cancer    Past Surgical History  Procedure Laterality Date  . Spine surgery    . Carpal tunnel release    . Back surgery    . Arthroscopic knee Right   . Removal skin cancer      basal cell    No Known Allergies  History  Substance Use Topics  . Smoking status: Never Smoker   . Smokeless tobacco: Never Used  . Alcohol Use: No    Family History  Problem Relation Age of Onset  . Arthritis Mother   . Cancer Father    Prior to Admission medications   Medication Sig Start Date End Date Taking? Authorizing Provider  acetaminophen (TYLENOL) 500 MG tablet Take 1,000 mg by mouth every 6 (six) hours as needed.   Yes Historical Provider, MD  allopurinol (ZYLOPRIM) 300 MG tablet Take 300 mg by mouth daily.  08/04/11  Yes Historical Provider, MD  fish oil-omega-3 fatty acids 1000 MG capsule Take 2 g by mouth daily.   Yes Historical Provider, MD  glucosamine-chondroitin 500-400 MG tablet Take 1 tablet by mouth 3 (three) times daily.   Yes Historical Provider, MD  HYDROcodone-acetaminophen (NORCO/VICODIN) 5-325 MG per tablet Take 1 tablet by mouth every 6 (six) hours as needed. for pain 10/07/14  Yes Historical Provider, MD  ketoconazole (NIZORAL) 2 % cream Apply 1 application topically as needed for irritation.   Yes Historical  Provider, MD  lisinopril (PRINIVIL,ZESTRIL) 20 MG tablet Take 20 mg by mouth daily.   Yes Historical Provider, MD  Multiple Vitamin (MULTIVITAMIN) tablet Take 1 tablet by mouth daily.   Yes Historical Provider, MD  omeprazole (PRILOSEC OTC) 20 MG tablet Take 20 mg by mouth as needed (heartburn).    Yes Historical Provider, MD  OVER THE COUNTER MEDICATION Take 1 tablet by mouth daily as needed (immune systetm boost). OPC-3   Yes Historical Provider, MD  vitamin B-12 (CYANOCOBALAMIN) 100 MCG tablet Take 100 mcg by mouth daily.   Yes Historical Provider, MD     Review of Systems  Positive ROS: As above  All other systems have been reviewed and were otherwise negative with the exception of those mentioned in the HPI and as above.  Objective: Vital signs in last 24 hours: Temp:  [97.9 F (36.6 C)] 97.9 F (36.6 C) (06/15 0737) Pulse Rate:  [62] 62 (06/15 0737) Resp:  [18] 18 (06/15 0737) BP: (237)/(70) 237/70 mmHg (06/15 0737) SpO2:  [97 %] 97 % (06/15 0737) Weight:  [74.753 kg (164 lb 12.8 oz)] 74.753 kg (164 lb 12.8 oz) (06/15 0737)  General Appearance: Alert, cooperative, no distress, Head: Normocephalic, without obvious abnormality, atraumatic Eyes: PERRL, conjunctiva/corneas clear, EOM's intact,    Ears: Normal  Throat: Normal  Neck: Supple, symmetrical, trachea midline, no adenopathy; thyroid: No enlargement/tenderness/nodules;  no carotid bruit or JVD Back: Symmetric, no curvature, ROM normal, no CVA tenderness Lungs: Clear to auscultation bilaterally, respirations unlabored Heart: Regular rate and rhythm, no murmur, rub or gallop Abdomen: Soft, non-tender,, no masses, no organomegaly Extremities: Extremities normal, atraumatic, no cyanosis or edema Pulses: 2+ and symmetric all extremities Skin: Skin color, texture, turgor normal, no rashes or lesions  NEUROLOGIC:   Mental status: alert and oriented, no aphasia, good attention span, Fund of knowledge/ memory ok Motor Exam -  grossly normal Sensory Exam - grossly normal Reflexes:  Coordination - grossly normal Gait - grossly normal Balance - grossly normal Cranial Nerves: I: smell Not tested  II: visual acuity  OS: Normal  OD: Normal   II: visual fields Full to confrontation  II: pupils Equal, round, reactive to light  III,VII: ptosis None  III,IV,VI: extraocular muscles  Full ROM  V: mastication Normal  V: facial light touch sensation  Normal  V,VII: corneal reflex  Present  VII: facial muscle function - upper  Normal  VII: facial muscle function - lower Normal  VIII: hearing Not tested  IX: soft palate elevation  Normal  IX,X: gag reflex Present  XI: trapezius strength  5/5  XI: sternocleidomastoid strength 5/5  XI: neck flexion strength  5/5  XII: tongue strength  Normal    Data Review Lab Results  Component Value Date   WBC 11.4* 10/20/2014   HGB 14.5 10/20/2014   HCT 42.8 10/20/2014   MCV 89.0 10/20/2014   PLT 263 10/20/2014   Lab Results  Component Value Date   NA 136 10/20/2014   K 3.8 10/20/2014   CL 100* 10/20/2014   CO2 24 10/20/2014   BUN 15 10/20/2014   CREATININE 0.94 10/20/2014   GLUCOSE 102* 10/20/2014   Lab Results  Component Value Date   INR 0.96 01/30/2011    Assessment/Plan: L4-5 spondylolisthesis, L2-3, L3-4 and L4-5 spinal stenosis, lumbar scoliosis, lumbago, lumbar radiculopathy I have discussed situation with the patient. I have reviewed her imaging studies with her. I pointed out the abnormalities. We have discussed the various treatment options including surgery. I have described the surgical treatment option of an L4-5 decompression, instrumentation, and fusion with laminectomy at L2-3 and L3-4. I have shown her surgical models. We have discussed the risks, benefits, alternatives, and likelihood of achieving our goals with surgery. I have answered all the patient's questions. She has decided to proceed with surgery.   Nilam Quakenbush D 10/28/2014 8:21 AM

## 2014-10-28 NOTE — Progress Notes (Signed)
Subjective:  The patient is somnolent but easily arousable. She is in no apparent distress. She looks well.  Objective: Vital signs in last 24 hours: Temp:  [97.8 F (36.6 C)-97.9 F (36.6 C)] 97.8 F (36.6 C) (06/15 1315) Pulse Rate:  [62] 62 (06/15 0737) Resp:  [18] 18 (06/15 0737) BP: (237)/(70) 237/70 mmHg (06/15 0737) SpO2:  [97 %] 97 % (06/15 0737) Weight:  [74.753 kg (164 lb 12.8 oz)] 74.753 kg (164 lb 12.8 oz) (06/15 0737)  Intake/Output from previous day:   Intake/Output this shift: Total I/O In: 2355 [I.V.:2000; Blood:105; IV Piggyback:250] Out: 888 [Urine:285; Blood:350]  Physical exam the patient is somnolent but arousable. She is moving her lower extremities well.  Lab Results: No results for input(s): WBC, HGB, HCT, PLT in the last 72 hours. BMET No results for input(s): NA, K, CL, CO2, GLUCOSE, BUN, CREATININE, CALCIUM in the last 72 hours.  Studies/Results: Dg Lumbar Spine 2-3 Views  10/28/2014   CLINICAL DATA:  For L4-5 posterior lumbar interbody fusion, localization  EXAM: LUMBAR SPINE - 2-3 VIEW  COMPARISON:  Lumbar spine films from [facility]  FINDINGS: Localization lateral view labeled 1 shows and instrument posteriorly directed toward the posterior inferior body of L5 with the clamp on the spinous process of L4.  Localization film labeled 2 shows and instrument directed toward the L3-4 interspace.  IMPRESSION: Localization of L3-4 on the last film obtained.   Electronically Signed   By: Ivar Drape M.D.   On: 10/28/2014 11:23    Assessment/Plan: The patient is doing well.  LOS: 0 days     Nicki Furlan D 10/28/2014, 1:21 PM

## 2014-10-28 NOTE — Anesthesia Preprocedure Evaluation (Signed)
Anesthesia Evaluation  Patient identified by MRN, date of birth, ID band Patient awake    Reviewed: Allergy & Precautions, NPO status , Patient's Chart, lab work & pertinent test results  Airway Mallampati: II  TM Distance: >3 FB Neck ROM: Full    Dental  (+) Teeth Intact, Dental Advisory Given   Pulmonary  breath sounds clear to auscultation        Cardiovascular hypertension, Rhythm:Regular Rate:Normal     Neuro/Psych    GI/Hepatic   Endo/Other    Renal/GU      Musculoskeletal   Abdominal   Peds  Hematology   Anesthesia Other Findings   Reproductive/Obstetrics                             Anesthesia Physical Anesthesia Plan  ASA: III  Anesthesia Plan: General   Post-op Pain Management:    Induction: Intravenous  Airway Management Planned: Oral ETT  Additional Equipment:   Intra-op Plan:   Post-operative Plan: Extubation in OR  Informed Consent: I have reviewed the patients History and Physical, chart, labs and discussed the procedure including the risks, benefits and alternatives for the proposed anesthesia with the patient or authorized representative who has indicated his/her understanding and acceptance.   Dental advisory given  Plan Discussed with: CRNA and Anesthesiologist  Anesthesia Plan Comments: (htn GERD  Plan GA with oral ETT  Roberts Gaudy)        Anesthesia Quick Evaluation

## 2014-10-28 NOTE — Anesthesia Procedure Notes (Signed)
Procedure Name: Intubation Date/Time: 10/28/2014 8:44 AM Performed by: Eligha Bridegroom Pre-anesthesia Checklist: Emergency Drugs available, Patient identified, Timeout performed, Suction available and Patient being monitored Patient Re-evaluated:Patient Re-evaluated prior to inductionOxygen Delivery Method: Circle system utilized Intubation Type: IV induction Ventilation: Mask ventilation without difficulty and Oral airway inserted - appropriate to patient size Laryngoscope Size: Mac and 3 Grade View: Grade II Tube type: Oral Tube size: 7.0 mm Number of attempts: 1 Airway Equipment and Method: Stylet and LTA kit utilized Placement Confirmation: ETT inserted through vocal cords under direct vision,  breath sounds checked- equal and bilateral and positive ETCO2 Secured at: 20 cm Tube secured with: Tape Dental Injury: Teeth and Oropharynx as per pre-operative assessment

## 2014-10-28 NOTE — Progress Notes (Signed)
Orthopedic Tech Progress Note Patient Details:  GRACEE RATTERREE 04-24-1931 [id number] Patient already has brace Patient ID: Belle Bing, female   DOB: 03/17/1931, 81 y.o.   MRN: [id number]   Braulio Bosch 10/28/2014, 4:30 PM

## 2014-10-29 LAB — URINALYSIS, ROUTINE W REFLEX MICROSCOPIC
Bilirubin Urine: NEGATIVE
Glucose, UA: NEGATIVE mg/dL
KETONES UR: NEGATIVE mg/dL
LEUKOCYTES UA: NEGATIVE
NITRITE: NEGATIVE
PROTEIN: NEGATIVE mg/dL
Specific Gravity, Urine: 1.006 (ref 1.005–1.030)
Urobilinogen, UA: 0.2 mg/dL (ref 0.0–1.0)
pH: 6 (ref 5.0–8.0)

## 2014-10-29 LAB — URINE MICROSCOPIC-ADD ON

## 2014-10-29 MED ORDER — SALINE SPRAY 0.65 % NA SOLN
1.0000 | NASAL | Status: DC | PRN
  Administered 2014-10-29: 1 via NASAL
  Filled 2014-10-29: qty 44

## 2014-10-29 MED FILL — Sodium Chloride IV Soln 0.9%: INTRAVENOUS | Qty: 2000 | Status: AC

## 2014-10-29 MED FILL — Heparin Sodium (Porcine) Inj 1000 Unit/ML: INTRAMUSCULAR | Qty: 30 | Status: AC

## 2014-10-29 NOTE — Care Management (Signed)
Utilization review completed by Joe Tanney N. Juleah Paradise, RN BSN 

## 2014-10-29 NOTE — Evaluation (Signed)
Physical Therapy Evaluation Patient Details Name: Brenda [name] MRN: [id number] DOB: 12-08-1930 Today's Date: 10/29/2014   History of Present Illness  Patient is a 81 year old white female who has complained of back, buttock, and leg pain consistent with neurogenic claudication. Has failed medical management and was worked up with a lumbar MRI. This demonstrated multilevel spondylosis, stenosis and spondylolisthesis at L4-5.  Now s/p L2-4 bilat laminectomies and PLIF L4-5.  Clinical Impression  Patient presents with decreased independence with mobility due to deficits listed below in PT problem list. She will benefit from skilled PT in the acute setting to allow return home with family assist and HHPT follow up.   Follow Up Recommendations Home health PT    Equipment Recommendations  Rolling walker with 5" wheels    Recommendations for Other Services       Precautions / Restrictions Precautions Precautions: Back Required Braces or Orthoses: Spinal Brace Spinal Brace: Lumbar corset;Applied in sitting position      Mobility  Bed Mobility Overal bed mobility: Needs Assistance Bed Mobility: Sidelying to Sit;Sit to Sidelying   Sidelying to sit: Min guard     Sit to sidelying: Mod assist General bed mobility comments: assist for feet into bed, cues for technique for spinal precautions  Transfers Overall transfer level: Needs assistance Equipment used: Rolling walker (2 wheeled) Transfers: Sit to/from Stand Sit to Stand: Min assist         General transfer comment: cues for precautions  Ambulation/Gait Ambulation/Gait assistance: Min guard Ambulation Distance (Feet): 300 Feet Assistive device: Rolling walker (2 wheeled) Gait Pattern/deviations: Step-through pattern;Decreased stride length     General Gait Details: stiff throughout during gait  Stairs            Wheelchair Mobility    Modified Rankin (Stroke Patients Only)       Balance Overall balance  assessment: Needs assistance         Standing balance support: Bilateral upper extremity supported Standing balance-Leahy Scale: Poor Standing balance comment: reliant on UE support for balance                             Pertinent Vitals/Pain Pain Assessment: 0-10 Faces Pain Scale: Hurts little more Pain Location: incision Pain Descriptors / Indicators: Sore Pain Intervention(s): Monitored during session;Repositioned    Home Living Family/patient expects to be discharged to:: Private residence Living Arrangements: Spouse/significant other;Children Available Help at Discharge: Family Type of Home: House Home Access: Stairs to enter Entrance Stairs-Rails: Right Entrance Stairs-Number of Steps: 5 Home Layout: Two level;Able to live on main level with bedroom/bathroom Home Equipment: Walker - 4 wheels      Prior Function Level of Independence: Independent with assistive device(s)         Comments: reports was using walker borrowed from friend PTA     Hand Dominance        Extremity/Trunk Assessment               Lower Extremity Assessment: Overall WFL for tasks assessed         Communication   Communication: No difficulties  Cognition Arousal/Alertness: Awake/alert Behavior During Therapy: WFL for tasks assessed/performed Overall Cognitive Status: Within Functional Limits for tasks assessed                      General Comments      Exercises        Assessment/Plan  PT Assessment Patient needs continued PT services  PT Diagnosis Difficulty walking;Acute pain   PT Problem List Decreased strength;Decreased mobility;Decreased knowledge of precautions;Decreased knowledge of use of DME;Decreased balance;Pain;Decreased activity tolerance  PT Treatment Interventions DME instruction;Therapeutic exercise;Gait training;Balance training;Stair training;Functional mobility training;Therapeutic activities;Patient/family education   PT  Goals (Current goals can be found in the Care Plan section) Acute Rehab PT Goals Patient Stated Goal: To return home PT Goal Formulation: With patient/family Time For Goal Achievement: 11/05/14 Potential to Achieve Goals: Good    Frequency Min 6X/week   Barriers to discharge        Co-evaluation               End of Session Equipment Utilized During Treatment: Back brace Activity Tolerance: Patient tolerated treatment well Patient left: in bed;with call bell/phone within reach;with family/visitor present           Time: 6384-6659 PT Time Calculation (min) (ACUTE ONLY): 25 min   Charges:   PT Evaluation $Initial PT Evaluation Tier I: 1 Procedure PT Treatments $Gait Training: 8-22 mins   PT G Codes:        WYNN,CYNDI 11/26/2014, 10:09 AM  Magda Kiel, [place] 2014/11/26

## 2014-10-29 NOTE — Progress Notes (Signed)
Patient ID: Brenda [name], female   DOB: August 01, 1930, 81 y.o.   MRN: [id number] Subjective:  Patient is alert and pleasant. She looks well. She is in no apparent distress.  Objective: Vital signs in last 24 hours: Temp:  [97.8 F (36.6 C)-98.6 F (37 C)] 98.6 F (37 C) (06/16 0400) Pulse Rate:  [59-96] 96 (06/16 0400) Resp:  [12-24] 16 (06/16 0400) BP: (150-194)/(56-87) 156/73 mmHg (06/16 0400) SpO2:  [95 %-100 %] 96 % (06/16 0400)  Intake/Output from previous day: 06/15 0701 - 06/16 0700 In: 2595 [P.O.:240; I.V.:2000; Blood:105; IV Piggyback:250] Out: 2365 [Urine:1460; Drains:555; Blood:350] Intake/Output this shift:    Physical exam the patient is alert and oriented 3. She is moving her lower extremities well. She looks well.  Lab Results: No results for input(s): WBC, HGB, HCT, PLT in the last 72 hours. BMET No results for input(s): NA, K, CL, CO2, GLUCOSE, BUN, CREATININE, CALCIUM in the last 72 hours.  Studies/Results: Dg Lumbar Spine 2-3 Views  10/28/2014   CLINICAL DATA:  L4-5 PLIF.  EXAM: LUMBAR SPINE - 2-3 VIEW; DG C-ARM 61-120 MIN  COMPARISON:  10/28/2014  FLUOROSCOPY TIME:  0 minutes 24 seconds  FINDINGS: Examination demonstrates a surgical instrument with tip over the mid disc space at the L4-5 level. There is mild spondylosis of the lumbar spine. Subsequent images demonstrate placement of posterior spinal fusion hardware with pedicle screws at the L4-5 level which is intact. Intervertebral cage is intact and appropriately positioned at the L4-5 level. Recommend correlation with findings at the time of the procedure.  IMPRESSION: Posterior spinal fusion hardware intact at the L4-5 level with intervertebral cage at the L4-5 disc space.   Electronically Signed   By: Marin Olp M.D.   On: 10/28/2014 13:34   Dg Lumbar Spine 2-3 Views  10/28/2014   CLINICAL DATA:  For L4-5 posterior lumbar interbody fusion, localization  EXAM: LUMBAR SPINE - 2-3 VIEW  COMPARISON:  Lumbar  spine films from [facility]  FINDINGS: Localization lateral view labeled 1 shows and instrument posteriorly directed toward the posterior inferior body of L5 with the clamp on the spinous process of L4.  Localization film labeled 2 shows and instrument directed toward the L3-4 interspace.  IMPRESSION: Localization of L3-4 on the last film obtained.   Electronically Signed   By: Ivar Drape M.D.   On: 10/28/2014 11:23   Dg C-arm 1-60 Min  10/28/2014   CLINICAL DATA:  L4-5 PLIF.  EXAM: LUMBAR SPINE - 2-3 VIEW; DG C-ARM 61-120 MIN  COMPARISON:  10/28/2014  FLUOROSCOPY TIME:  0 minutes 24 seconds  FINDINGS: Examination demonstrates a surgical instrument with tip over the mid disc space at the L4-5 level. There is mild spondylosis of the lumbar spine. Subsequent images demonstrate placement of posterior spinal fusion hardware with pedicle screws at the L4-5 level which is intact. Intervertebral cage is intact and appropriately positioned at the L4-5 level. Recommend correlation with findings at the time of the procedure.  IMPRESSION: Posterior spinal fusion hardware intact at the L4-5 level with intervertebral cage at the L4-5 disc space.   Electronically Signed   By: Marin Olp M.D.   On: 10/28/2014 13:34    Assessment/Plan: Postop day #1: The patient is doing well. We will mobilize her with PT. She will likely go home over the weekend.  LOS: 1 day     Yi Falletta D 10/29/2014, 7:38 AM

## 2014-10-29 NOTE — Anesthesia Postprocedure Evaluation (Signed)
  Anesthesia Post-op Note  Patient: Brenda [name]  Procedure(s) Performed: Procedure(s) with comments: L45 Fusion w/L23 and L34 laminectomies (N/A) - L45 posterior lumbar interbody fusion with interbody prosthesis posterior lateral arthrodesis and posterior nonsegmental instrumentation with L23 and L34 laminectomies  Patient Location: PACU  Anesthesia Type:General  Level of Consciousness: awake, alert  and oriented  Airway and Oxygen Therapy: Patient Spontanous Breathing and Patient connected to nasal cannula oxygen  Post-op Pain: mild  Post-op Assessment: Post-op Vital signs reviewed, Patient's Cardiovascular Status Stable, Respiratory Function Stable, Patent Airway and Pain level controlled LLE Motor Response: Purposeful movement LLE Sensation: Tingling RLE Motor Response: Purposeful movement RLE Sensation: Full sensation      Post-op Vital Signs: stable  Last Vitals:  Filed Vitals:   10/29/14 0821  BP: 147/54  Pulse: 66  Temp: 36.9 C  Resp:     Complications: No apparent anesthesia complications

## 2014-10-30 ENCOUNTER — Inpatient Hospital Stay (HOSPITAL_COMMUNITY): Payer: Medicare Other

## 2014-10-30 LAB — URINE MICROSCOPIC-ADD ON

## 2014-10-30 LAB — URINALYSIS, ROUTINE W REFLEX MICROSCOPIC
Bilirubin Urine: NEGATIVE
Glucose, UA: NEGATIVE mg/dL
KETONES UR: NEGATIVE mg/dL
Leukocytes, UA: NEGATIVE
NITRITE: NEGATIVE
PH: 5.5 (ref 5.0–8.0)
PROTEIN: NEGATIVE mg/dL
Specific Gravity, Urine: 1.008 (ref 1.005–1.030)
Urobilinogen, UA: 0.2 mg/dL (ref 0.0–1.0)

## 2014-10-30 LAB — CBC WITH DIFFERENTIAL/PLATELET
BASOS ABS: 0 10*3/uL (ref 0.0–0.1)
BASOS PCT: 0 % (ref 0–1)
EOS ABS: 0.1 10*3/uL (ref 0.0–0.7)
EOS PCT: 1 % (ref 0–5)
HEMATOCRIT: 34.1 % — AB (ref 36.0–46.0)
Hemoglobin: 11.4 g/dL — ABNORMAL LOW (ref 12.0–15.0)
Lymphocytes Relative: 13 % (ref 12–46)
Lymphs Abs: 2.4 10*3/uL (ref 0.7–4.0)
MCH: 31 pg (ref 26.0–34.0)
MCHC: 33.4 g/dL (ref 30.0–36.0)
MCV: 92.7 fL (ref 78.0–100.0)
Monocytes Absolute: 2 10*3/uL — ABNORMAL HIGH (ref 0.1–1.0)
Monocytes Relative: 11 % (ref 3–12)
Neutro Abs: 14.3 10*3/uL — ABNORMAL HIGH (ref 1.7–7.7)
Neutrophils Relative %: 75 % (ref 43–77)
PLATELETS: 200 10*3/uL (ref 150–400)
RBC: 3.68 MIL/uL — ABNORMAL LOW (ref 3.87–5.11)
RDW: 14.3 % (ref 11.5–15.5)
WBC: 18.8 10*3/uL — ABNORMAL HIGH (ref 4.0–10.5)

## 2014-10-30 MED ORDER — METHOCARBAMOL 500 MG PO TABS
500.0000 mg | ORAL_TABLET | Freq: Four times a day (QID) | ORAL | Status: DC | PRN
  Administered 2014-10-30 – 2014-11-02 (×5): 500 mg via ORAL
  Filled 2014-10-30 (×5): qty 1

## 2014-10-30 NOTE — Progress Notes (Signed)
Patient ID: Brenda [name], female   DOB: 06-11-1930, 81 y.o.   MRN: [id number] Subjective:  The patient is alert and pleasant. She is in no apparent distress. She is interested in going to rehabilitation. She's had some urinary retention and a Foley is back in.  Objective: Vital signs in last 24 hours: Temp:  [98.2 F (36.8 C)-102.9 F (39.4 C)] 98.6 F (37 C) (06/17 1230) Pulse Rate:  [94-121] 97 (06/17 1230) Resp:  [16-20] 20 (06/17 1230) BP: (128-163)/(47-67) 154/63 mmHg (06/17 1230) SpO2:  [95 %-97 %] 97 % (06/17 1230)  Intake/Output from previous day: 06/16 0701 - 06/17 0700 In: 360 [P.O.:360] Out: 2800 [Urine:2700; Drains:100] Intake/Output this shift: Total I/O In: -  Out: 400 [Urine:400]  Physical exam the patient is alert and pleasant. Her strength is normal in her lower extremities.  Lab Results: No results for input(s): WBC, HGB, HCT, PLT in the last 72 hours. BMET No results for input(s): NA, K, CL, CO2, GLUCOSE, BUN, CREATININE, CALCIUM in the last 72 hours.  Studies/Results: No results found.  Assessment/Plan: Postop day #2: The patient is doing well neurologically. Will continue to mobilize her. I'll ask rehabilitation to see the patient per her request.  Urinary retention: This will likely resolve.  Fever: I will check a urinalysis, blood cultures, CBC, chest x-ray, etc.  LOS: 2 days     Brenda [name] D 10/30/2014, 2:23 PM

## 2014-10-30 NOTE — Evaluation (Signed)
Occupational Therapy Evaluation Patient Details Name: Brenda [name] MRN: [id number] DOB: March 08, 1931 Today's Date: 10/30/2014    History of Present Illness Patient is a 81 year old white female who has complained of back, buttock, and leg pain consistent with neurogenic claudication. Has failed medical management and was worked up with a lumbar MRI. This demonstrated multilevel spondylosis, stenosis and spondylolisthesis at L4-5.  Now s/p L2-4 bilat laminectomies and PLIF L4-5.   Clinical Impression   Pt admitted with above. She demonstrates the below listed deficits and will benefit from continued OT to maximize safety and independence with BADLs.  Pt presents to OT with  impaired balance, generalized weakness, increased pain.  Currently, she requires mod A for ADLs, and min A +2 for functional mobility.  She will need post acute rehab.  Recommend CIR as pt has been there previously for tumor resection. Family supportive.       Follow Up Recommendations  CIR;Supervision/Assistance - 24 hour    Equipment Recommendations  None recommended by OT    Recommendations for Other Services       Precautions / Restrictions Precautions Precautions: Back Precaution Comments: Pt able to recall 2/3 back precautions.  Required Braces or Orthoses: Spinal Brace Spinal Brace: Lumbar corset;Applied in sitting position      Mobility Bed Mobility Overal bed mobility: Needs Assistance Bed Mobility: Rolling;Sidelying to Sit;Sit to Sidelying Rolling: Min assist Sidelying to sit: Min assist     Sit to sidelying: Max assist General bed mobility comments: Pt requires min A for log rolling due to difficulty flexing Lt knees, and required assist to lift trunk into sitting. max A to return to sidelying due to inability to lift LEs onto bed and assist to transition trunk to sidelying   Transfers Overall transfer level: Needs assistance Equipment used: Rolling walker (2 wheeled) Transfers: Sit  to/from [facility] Sit to Stand: Mod assist Stand pivot transfers: Min assist;+2 physical assistance       General transfer comment: Pt required mod A to power up into standing due to weakness.  Required min A +2 for transfer due to impaired balance and instability     Balance Overall balance assessment: Needs assistance Sitting-balance support: Feet supported;Bilateral upper extremity supported Sitting balance-Leahy Scale: Poor Sitting balance - Comments: requires min A   Standing balance support: Bilateral upper extremity supported Standing balance-Leahy Scale: Poor Standing balance comment: requires min A +2 and bil. UE support                             ADL Overall ADL's : Needs assistance/impaired Eating/Feeding: Independent;Bed level   Grooming: Wash/dry hands;Wash/dry face;Oral care;Brushing hair;Minimal assistance;Sitting Grooming Details (indicate cue type and reason): requires min A for sitting balance EOB  Upper Body Bathing: Minimal assitance;Sitting   Lower Body Bathing: Maximal assistance;Sit to/from stand   Upper Body Dressing : Moderate assistance;Sitting   Lower Body Dressing: Total assistance;Sit to/from stand Lower Body Dressing Details (indicate cue type and reason): Pt unable to cross ankles over knees to access feet  Toilet Transfer: Moderate assistance;Ambulation;Comfort height toilet;Grab bars;RW Armed forces technical officer Details (indicate cue type and reason): requires mod A to transition sit <> stand  Toileting- Clothing Manipulation and Hygiene: Maximal assistance;Sit to/from stand       Functional mobility during ADLs: +2 for physical assistance;Minimal assistance;Moderate assistance General ADL Comments: Pt requires mod A to power up into standing and min A +2 (son assisting) with functional transfers.  Pt fatigues rapidly and is unsteady on her feet      Vision     Perception     Praxis      Pertinent Vitals/Pain  Pain Assessment: 0-10 Pain Score: 7  Pain Location: back and LEs Pain Descriptors / Indicators: Aching;Constant;Cramping Pain Intervention(s): Monitored during session;Limited activity within patient's tolerance;Repositioned;Patient requesting pain meds-RN notified     Hand Dominance Right   Extremity/Trunk Assessment Upper Extremity Assessment Upper Extremity Assessment: Overall WFL for tasks assessed   Lower Extremity Assessment Lower Extremity Assessment: Defer to PT evaluation   Cervical / Trunk Assessment Cervical / Trunk Assessment: Normal   Communication Communication Communication: No difficulties   Cognition Arousal/Alertness: Awake/alert Behavior During Therapy: WFL for tasks assessed/performed Overall Cognitive Status: Within Functional Limits for tasks assessed                     General Comments       Exercises       Shoulder Instructions      Home Living Family/patient expects to be discharged to:: Private residence Living Arrangements: Spouse/significant other;Children Available Help at Discharge: Family Type of Home: House Home Access: Stairs to enter Technical brewer of Steps: 5 Entrance Stairs-Rails: Right Home Layout: Two level;Able to live on main level with bedroom/bathroom     Bathroom Shower/Tub: Occupational psychologist: Standard     Home Equipment: Environmental consultant - 4 wheels;Shower seat - built in;Grab bars - tub/shower          Prior Functioning/Environment Level of Independence: Needs assistance  Gait / Transfers Assistance Needed: family reports she was unsteady and requested assistance with ambulation  ADL's / Homemaking Assistance Needed: required assist with shower transfers, but was otherwise mod I    Comments: reports was using walker borrowed from friend PTA    OT Diagnosis: Generalized weakness;Acute pain   OT Problem List: Decreased strength;Decreased activity tolerance;Impaired balance (sitting and/or  standing);Decreased safety awareness;Decreased knowledge of use of DME or AE;Decreased knowledge of precautions;Pain   OT Treatment/Interventions: Self-care/ADL training;DME and/or AE instruction;Therapeutic activities;Patient/family education;Balance training    OT Goals(Current goals can be found in the care plan section) Acute Rehab OT Goals Patient Stated Goal: To return home OT Goal Formulation: With patient/family Time For Goal Achievement: 11/06/14 Potential to Achieve Goals: Good ADL Goals Pt Will Perform Grooming: with min guard assist;standing Pt Will Perform Upper Body Bathing: with min guard assist;with adaptive equipment;sitting Pt Will Perform Lower Body Bathing: with min guard assist;with adaptive equipment;sit to/from stand Pt Will Perform Upper Body Dressing: with min guard assist;sitting Pt Will Perform Lower Body Dressing: with min guard assist;sitting/lateral leans;with adaptive equipment Pt Will Transfer to Toilet: with min guard assist;ambulating;regular height toilet;grab bars;bedside commode Pt Will Perform Toileting - Clothing Manipulation and hygiene: with min guard assist;with adaptive equipment;sit to/from stand  OT Frequency: Min 2X/week   Barriers to D/C:            Co-evaluation              End of Session Equipment Utilized During Treatment: Rolling walker;Back brace Nurse Communication: Patient requests pain meds;Mobility status  Activity Tolerance: Patient limited by pain Patient left: in bed;with call bell/phone within reach;with family/visitor present   Time: 8588-5027 OT Time Calculation (min): 31 min Charges:  OT General Charges $OT Visit: 1 Procedure OT Evaluation $Initial OT Evaluation Tier I: 1 Procedure OT Treatments $Self Care/Home Management : 8-22 mins G-Codes:    Fergie Sherbert, Ellard Artis  M 10/30/2014, 1:46 PM

## 2014-10-30 NOTE — Progress Notes (Signed)
Physical medicine rehabilitation consult requested chart reviewed. [employer] will not justify inpatient rehabilitation services for this diagnoses. Patient is doing well overall 300 feet minimal assistance recommendations by physical therapy offered home health therapies. Hold on formal rehabilitation consult at this time with recommendations for home versus skilled nursing facility

## 2014-10-30 NOTE — Progress Notes (Addendum)
Physical Therapy Treatment Patient Details Name: Brenda [name] MRN: [id number] DOB: [birth year] Today's Date: 10/30/2014    History of Present Illness Patient is a 81 year old white female who has complained of back, buttock, and leg pain consistent with neurogenic claudication. Has failed medical management and was worked up with a lumbar MRI. This demonstrated multilevel spondylosis, stenosis and spondylolisthesis at L4-5.  Now s/p L2-4 bilat laminectomies and PLIF L4-5.    PT Comments    Pt moves slowly and need consistent cueing for back precautions and safety.  Pt indicates pain in Bil LEs today, but was still agreeable to mobility.  Pt and daughter concerned about managing at home and are interested in further therapies before returning to home.  Feel pt would benefit from ST-SNF to decrease burden of care prior to returning to home.    Follow Up Recommendations  SNF     Equipment Recommendations  Rolling walker with 5" wheels    Recommendations for Other Services       Precautions / Restrictions Precautions Precautions: Back Precaution Comments: Reviewed back precautions Required Braces or Orthoses: Spinal Brace Spinal Brace: Lumbar corset;Applied in sitting position Restrictions Weight Bearing Restrictions: No    Mobility  Bed Mobility Overal bed mobility: Needs Assistance Bed Mobility: Rolling;Sidelying to Sit Rolling: Min guard Sidelying to sit: Min guard       General bed mobility comments: cues for log roll technique and back precautions.    Transfers Overall transfer level: Needs assistance Equipment used: Rolling walker (2 wheeled) Transfers: Sit to/from Stand Sit to Stand: Min assist         General transfer comment: cues for UE use.    Ambulation/Gait Ambulation/Gait assistance: Min guard Ambulation Distance (Feet): 200 Feet Assistive device: Rolling walker (2 wheeled) Gait Pattern/deviations: Step-through pattern;Decreased stride length      General Gait Details: very slow and guarded gait.  pt indicates increased pain in Bil LEs today.     Stairs            Wheelchair Mobility    Modified Rankin (Stroke Patients Only)       Balance Overall balance assessment: Needs assistance         Standing balance support: Bilateral upper extremity supported;During functional activity Standing balance-Leahy Scale: Poor                      Cognition Arousal/Alertness: Awake/alert Behavior During Therapy: WFL for tasks assessed/performed Overall Cognitive Status: Within Functional Limits for tasks assessed                      Exercises      General Comments        Pertinent Vitals/Pain Pain Assessment: Faces Faces Pain Scale: Hurts even more Pain Location: Bil LEs Pain Descriptors / Indicators: Sore Pain Intervention(s): Monitored during session;Premedicated before session;Repositioned    Home Living                      Prior Function            PT Goals (current goals can now be found in the care plan section) Acute Rehab PT Goals Patient Stated Goal: To return home PT Goal Formulation: With patient/family Time For Goal Achievement: 11/05/14 Potential to Achieve Goals: Good Progress towards PT goals: Progressing toward goals    Frequency  Min 5X/week    PT Plan Frequency needs to be updated;Discharge plan needs to be  updated    Co-evaluation             End of Session Equipment Utilized During Treatment: Back brace Activity Tolerance: Patient tolerated treatment well Patient left: in chair;with call bell/phone within reach;with family/visitor present     Time: 1859-0931 PT Time Calculation (min) (ACUTE ONLY): 24 min  Charges:  $Gait Training: 8-22 mins $Therapeutic Activity: 8-22 mins                    G CodesCatarina Hartshorn, [place] 10/30/2014, 12:47 PM

## 2014-10-31 NOTE — Progress Notes (Signed)
Patient ID: Brenda [name], female   DOB: 1931-01-25, 81 y.o.   MRN: [id number] Subjective:  The patient is alert and pleasant and in no apparent distress. She has been somewhat slow to mobilize. We are looking into a skilled nursing facility placement.  Objective: Vital signs in last 24 hours: Temp:  [98.5 F (36.9 C)-100.2 F (37.9 C)] 98.5 F (36.9 C) (06/18 0733) Pulse Rate:  [97-105] 103 (06/18 0735) Resp:  [18-20] 20 (06/18 0733) BP: (138-158)/(52-64) 147/52 mmHg (06/18 0735) SpO2:  [94 %-99 %] 94 % (06/18 0735)  Intake/Output from previous day: 06/17 0701 - 06/18 0700 In: 240 [P.O.:240] Out: 1900 [Urine:1900] Intake/Output this shift: Total I/O In: -  Out: 350 [Urine:350]  Physical exam the patient is alert and oriented. She is moving her lower extremities well.  Lab Results:  Recent Labs  10/30/14 1710  WBC 18.8*  HGB 11.4*  HCT 34.1*  PLT 200   BMET No results for input(s): NA, K, CL, CO2, GLUCOSE, BUN, CREATININE, CALCIUM in the last 72 hours.  Studies/Results: Dg Chest 1 View  10/30/2014   CLINICAL DATA:  Low-grade fever. Weakness. Postop from lumbar spine surgery.  EXAM: CHEST  1 VIEW  COMPARISON:  01/27/2011  FINDINGS: The heart size and mediastinal contours are within normal limits. Both lungs are clear. No evidence of pneumothorax or pleural effusion. The visualized skeletal structures are unremarkable.  IMPRESSION: No active disease.   Electronically Signed   By: Earle Gell M.[name].   On: 10/30/2014 17:27    Assessment/Plan: Postop day #3: The patient seems to be slowly progressing. She will need skilled nursing facility placement. I spoke with the patient's husband and daughter.  Fever: She has not had anymore fevers. Her white count is 18. Her chest x-ray was normal. Her urinalysis was unremarkable.  Urinary retention: We will try to discontinue her catheter tomorrow.  LOS: 3 days     Brenda [name] 10/31/2014, 9:17 AM

## 2014-10-31 NOTE — Progress Notes (Signed)
Patient ambulated from bed to chair with brace and walker. Patient required two assist to ambulate. Pt moving slowly and reports weak feeling in legs when walking.  Pt sat in chair for 30 minutes then assisted back to bed.

## 2014-10-31 NOTE — Progress Notes (Signed)
Physical Therapy Treatment Patient Details Name: Brenda [name] MRN: [id number] DOB: 06-Jul-1930 Today's Date: 10/31/2014    History of Present Illness Patient is a 81 year old white female who has complained of back, buttock, and leg pain consistent with neurogenic claudication. Has failed medical management and was worked up with a lumbar MRI. This demonstrated multilevel spondylosis, stenosis and spondylolisthesis at L4-5.  Now s/p L2-4 bilat laminectomies and PLIF L4-5.    PT Comments    Pt mobilizing far worse today with bil posterior thigh spasms/pain and bil knee buckling in standing (when spasms occur). Pt was only able to tolerate walking ~2 feet and her knees gave out. She describes increasing posterior thigh pain (tightness, cramping) since last evening. Notified RN of events and noted pt switched from Valium to Robaxin yesterday--? If meds can be adjusted (?dosage, type). RN to follow-up.    Follow Up Recommendations  SNF     Equipment Recommendations  Rolling walker with 5" wheels    Recommendations for Other Services       Precautions / Restrictions Precautions Precautions: Back Precaution Comments: pt only able to state 1/3  Required Braces or Orthoses: Spinal Brace Spinal Brace: Lumbar corset;Applied in sitting position Restrictions Weight Bearing Restrictions: No    Mobility  Bed Mobility Overal bed mobility: Needs Assistance Bed Mobility: Rolling;Sidelying to Sit Rolling: Supervision Sidelying to sit: Min assist       General bed mobility comments: cues for log roll technique and back precautions; +rail; assist to raise torso  Transfers Overall transfer level: Needs assistance Equipment used: Rolling walker (2 wheeled) Transfers: Sit to/from Stand Sit to Stand: Min assist;+2 physical assistance;+2 safety/equipment (daughter assisted)         General transfer comment: cues for UE use and safe use of RW; assist to power up to  stand  Ambulation/Gait Ambulation/Gait assistance: Max assist;+2 physical assistance Ambulation Distance (Feet): 3 Feet Assistive device: Rolling walker (2 wheeled) Gait Pattern/deviations: Step-to pattern;Decreased stride length;Shuffle;Antalgic     General Gait Details: after several small steps forward, legs began to buckle and pt unable to return to upright standing; assisted to pivot to nearby chair with daughter's assist   Stairs            Wheelchair Mobility    Modified Rankin (Stroke Patients Only)       Balance             Standing balance-Leahy Scale: Poor                      Cognition Arousal/Alertness: Awake/alert Behavior During Therapy: WFL for tasks assessed/performed Overall Cognitive Status: Impaired/Different from baseline Area of Impairment: Memory               General Comments: cannot recall some events over last few days,daughter often correcting her    Exercises      General Comments        Pertinent Vitals/Pain Pain Assessment: 0-10 Pain Score: 7  Pain Location: back of legs, back Pain Descriptors / Indicators: Cramping;Tightness Pain Intervention(s): Limited activity within patient's tolerance;Monitored during session;Premedicated before session;Repositioned    Home Living                      Prior Function            PT Goals (current goals can now be found in the care plan section) Acute Rehab PT Goals Patient Stated Goal: To return home Time For  Goal Achievement: 11/05/14 Potential to Achieve Goals: Good Progress towards PT goals: Not progressing toward goals - comment (incr leg spasms and pain)    Frequency  Min 5X/week    PT Plan Frequency needs to be updated;Current plan remains appropriate    Co-evaluation             End of Session Equipment Utilized During Treatment: Back brace Activity Tolerance: Patient limited by pain Patient left: in chair;with call bell/phone within  reach;with family/visitor present;with chair alarm set     Time: 5830-9407 PT Time Calculation (min) (ACUTE ONLY): 37 min  Charges:  $Gait Training: 8-22 mins $Therapeutic Activity: 8-22 mins                    G Codes:      Ezana Hubbert 11/17/2014, 1:55 PM Pager [phone]

## 2014-10-31 NOTE — Progress Notes (Signed)
Patient transferred from spinal center. patient alert and oriented x 4. Patient and family oriented to room and patient made comfortable. Will continue to monitor.

## 2014-10-31 NOTE — Clinical Social Work Note (Addendum)
Clinical Social Work Assessment  Patient Details  Name: Brenda [name] MRN: [id number] Date of Birth: [birth year]  Date of referral:  10/31/14               Reason for consult:  Facility Placement                Permission sought to share information with:  Family Supports Permission granted to share information::  Yes, Verbal Permission Granted  Name::      Brenda [name]  Agency::     Relationship::   Daughter  Contact Information:   [phone]  Housing/Transportation Living arrangements for the past 2 months:  Single Family Home Source of Information:  Patient, Spouse, Adult Children Patient Interpreter Needed:  None Criminal Activity/Legal Involvement Pertinent to Current Situation/Hospitalization:  No - Comment as needed Significant Relationships:  Adult Children, Spouse Lives with:  Adult Children, [place] while in [place] lives with daughter and son in law.) Do you feel safe going back to the place where you live?  Yes Need for family participation in patient care:  Yes (Comment)  Care giving concerns:  Patient lives with husband about 1 hour away, but currently living with daughter while in [place]. Patient agreeable to Skilled Rehab that is close to daughter's home. Patient stated that she was willing to go to Rehab and would prefer to be in [employer]    Social Worker assessment / plan:  CSW provided list of facilities for family as well as clear understanding of the process for discharge to SNF.   Employment status:  Retired Nurse, adult PT Recommendations:  [place] / Referral to community resources:  [place]  Patient/Family's Response to care: Patient making some improvement and moving forward in progression. Patient and family agreeable to SNF placement and will return to CSW with options for placement.   Patient/Family's Understanding of and  Emotional Response to Diagnosis, Current Treatment, and Prognosis:  Family looking forward to patient returning to previous level of functioning.   Emotional Assessment Appearance:  Appears stated age Attitude/Demeanor/Rapport:   (Appropriate) Affect (typically observed):  Accepting, Appropriate Orientation:  Oriented to Self, Oriented to Place, Oriented to  Time, Oriented to Situation Alcohol / Substance use:  Not Applicable Psych involvement (Current and /or in the community):  No (Comment)  Discharge Needs  Concerns to be addressed:  No discharge needs identified Readmission within the last 30 days:  No Current discharge risk:  None Barriers to Discharge:  No Barriers Identified   Brenda Lye, LCSW 10/31/2014, 1:46 PM Brenda [name]

## 2014-10-31 NOTE — Progress Notes (Signed)
PT Cancellation Note  Patient Details Name: Brenda [name] MRN: [id number] DOB: 1930-11-07   Cancelled Treatment:    Reason Eval/Treat Not Completed: Fatigue/lethargy limiting ability to participate. RN reported pt had a rough night and has not rested well. She also reports pt felt her knee pop/buckle when last up and pt is concerned. RN requested PT see pt later today.   Saniyyah Elster 10/31/2014, 8:42 AM  Pager [phone]

## 2014-10-31 NOTE — Progress Notes (Signed)
Patient ambulated  For about 30 feet unable to walk back to bed.  Patient placed in a wheel chair and assisted back to bed.

## 2014-11-01 LAB — URINE CULTURE: CULTURE: NO GROWTH

## 2014-11-01 MED ORDER — BISACODYL 10 MG RE SUPP
10.0000 mg | Freq: Every day | RECTAL | Status: DC | PRN
Start: 2014-11-01 — End: 2014-11-02

## 2014-11-01 NOTE — Progress Notes (Signed)
Orthopedic Tech Progress Note Patient Details:  [name] 12-28-1930 [id number] [employer] contacted for consult about supportive brace for patient. Knee immobilizer not necessary, while sleeve is not supportive enough. Ronalee Belts stated he has brace that will work for patient and will deliver 1st thing in the morning.  Patient ID: Brenda [name], female   DOB: [birth year], 81 y.o.   MRN: [id number]   Fenton Foy 11/01/2014, 4:01 PM

## 2014-11-01 NOTE — Progress Notes (Signed)
Foley removed at 2030. Pt tolerated well. Pt due to void at 0230 on 11/02/14.

## 2014-11-01 NOTE — Clinical Social Work Placement (Signed)
   CLINICAL SOCIAL WORK PLACEMENT  NOTE  Date:  11/01/2014  Patient Details  Name: Brenda [name] MRN: [id number] Date of Birth: 1930/09/24  Clinical Social Work is seeking post-discharge placement for this patient at the [place] level of care (*CSW will initial, date and re-position this form in  chart as items are completed):  Yes   Patient/family provided with [place] Work Department's list of facilities offering this level of care within the geographic area requested by the patient (or if unable, by the patient's family).  Yes   Patient/family informed of their freedom to choose among providers that offer the needed level of care, that participate in Medicare, Medicaid or managed care program needed by the patient, have an available bed and are willing to accept the patient.  Yes   Patient/family informed of [place]'s ownership interest in [facility] and [facility], as well as of the fact that they are under no obligation to receive care at these facilities.  PASRR submitted to EDS on 11/01/14     PASRR number received on 11/01/14     Existing PASRR number confirmed on       FL2 transmitted to all facilities in geographic area requested by pt/family on 11/01/14     FL2 transmitted to all facilities within larger geographic area on 11/01/14     Patient informed that his/her managed care company has contracts with or will negotiate with certain facilities, including the following:            Patient/family informed of bed offers received.  Patient chooses bed at       Physician recommends and patient chooses bed at      Patient to be transferred to   on  .  Patient to be transferred to facility by       Patient family notified on   of transfer.  Name of family member notified:        PHYSICIAN       Additional Comment:    _______________________________________________ [name], LCSW 11/01/2014, 10:14  AM

## 2014-11-01 NOTE — Progress Notes (Signed)
Patient ID: Brenda [name], female   DOB: 12-20-1930, 81 y.o.   MRN: [id number] Subjective:  The patient is alert and pleasant. She looks and feels better. She feels her left knee gives out on her and she request a knee brace.  Objective: Vital signs in last 24 hours: Temp:  [98.4 F (36.9 C)-99.7 F (37.6 C)] 98.8 F (37.1 C) (06/19 0939) Pulse Rate:  [88-114] 96 (06/19 0939) Resp:  [16-20] 18 (06/19 0939) BP: (97-161)/(54-82) 161/54 mmHg (06/19 0939) SpO2:  [96 %-99 %] 97 % (06/19 0939)  Intake/Output from previous day: 06/18 0701 - 06/19 0700 In: 240 [P.O.:240] Out: 1600 [Urine:1600] Intake/Output this shift: Total I/O In: -  Out: 1400 [Urine:1400]  Physical exam the patient is alert and pleasant. Her dressing is clean and dry. Her strength is grossly normal in her lower extremities. Her knee is unremarkable  Lab Results:  Recent Labs  10/30/14 1710  WBC 18.8*  HGB 11.4*  HCT 34.1*  PLT 200   BMET No results for input(s): NA, K, CL, CO2, GLUCOSE, BUN, CREATININE, CALCIUM in the last 72 hours.  Studies/Results: Dg Chest 1 View  10/30/2014   CLINICAL DATA:  Low-grade fever. Weakness. Postop from lumbar spine surgery.  EXAM: CHEST  1 VIEW  COMPARISON:  01/27/2011  FINDINGS: The heart size and mediastinal contours are within normal limits. Both lungs are clear. No evidence of pneumothorax or pleural effusion. The visualized skeletal structures are unremarkable.  IMPRESSION: No active disease.   Electronically Signed   By: Earle Gell M.D.   On: 10/30/2014 17:27    Assessment/Plan: Postop day #4: We'll again try to discontinue the Foley catheter. We'll get her a knee brace. She will likely go to a skilled nursing facility tomorrow. I spoke with the patient's daughter and husband.  LOS: 4 days     Mikella Linsley D 11/01/2014, 11:33 AM

## 2014-11-01 NOTE — Progress Notes (Signed)
Pt ambulated short distance from bed to chair with 2 assist.  Pt using back brace, left knee brace and walker when ambulating.  Pt having difficulty ambulating and reports weakness in legs. Pt sat in chair for 40 minutes and then assisted back to bed.

## 2014-11-02 MED ORDER — HYDROCODONE-ACETAMINOPHEN 5-325 MG PO TABS: 1.0000 | ORAL_TABLET | ORAL | Status: DC | PRN

## 2014-11-02 NOTE — Clinical Social Work Note (Signed)
Clinical Social Worker facilitated patient discharge including contacting patient family and facility to confirm patient discharge plans.  Clinical information faxed to facility and family agreeable with plan.  CSW arranged ambulance transport via [facility] to [facility].  RN to call report prior to discharge.  DC packet prepared and on chart for transport with number for report.  Clinical Social Worker will sign off for now as social work intervention is no longer needed. Please consult [place] again if new need arises.  Glendon Axe, MSW, [place] [phone] 11/02/2014 2:20 PM

## 2014-11-02 NOTE — Discharge Summary (Signed)
Physician Discharge Summary  Patient ID: Brenda [name] MRN: [id number] DOB/AGE: 07-28-1930 81 y.o.  Admit date: 10/28/2014 Discharge date: 11/02/2014  Admission Diagnoses: L4-5 spondylolisthesis, L2-3, L3-4 and L4-5 spinal stenosis, lumbago, lumbar radiculopathy, neurogenic claudication  Discharge Diagnoses: The same Active Problems:   Spondylolisthesis of lumbar region   Discharged Condition: good  Hospital Course: I performed a L4-5 decompression instrumentation and fusion with laminectomy at L2-3, L3-4 and L4-5 on the patient on 10/28/2014. The surgery went well.  The patient's postoperative course was unremarkable. Arrangements were made for her to go to a skilled nursing facility. She requested discharge on 11/02/2014. She, and her family, or given written and oral discharge instructions. All their questions were answered.  Consults: PT Significant Diagnostic Studies: None Treatments: L2-3, L3-4 and L4-5 laminectomy; L4-5 instrumentation and fusion. Discharge Exam: Blood pressure 134/59, pulse 73, temperature 97.3 F (36.3 C), temperature source Oral, resp. rate 17, weight 74.753 kg (164 lb 12.8 oz), SpO2 97 %. The patient is alert and pleasant. She looks well. Her strength is normal. Her dressing is clean and dry.  Disposition: [place]  Discharge Instructions    Call MD for:  difficulty breathing, headache or visual disturbances    Complete by:  As directed      Call MD for:  extreme fatigue    Complete by:  As directed      Call MD for:  hives    Complete by:  As directed      Call MD for:  persistant dizziness or light-headedness    Complete by:  As directed      Call MD for:  persistant nausea and vomiting    Complete by:  As directed      Call MD for:  redness, tenderness, or signs of infection (pain, swelling, redness, odor or green/yellow discharge around incision site)    Complete by:  As directed      Call MD for:  severe uncontrolled pain    Complete  by:  As directed      Call MD for:  temperature >100.4    Complete by:  As directed      Diet - low sodium heart healthy    Complete by:  As directed      Discharge instructions    Complete by:  As directed   Call [phone] for a followup appointment. Take a stool softener while you are using pain medications.     Driving Restrictions    Complete by:  As directed   Do not drive for 2 weeks.     Increase activity slowly    Complete by:  As directed      Lifting restrictions    Complete by:  As directed   Do not lift more than 5 pounds. No excessive bending or twisting.     May shower / Bathe    Complete by:  As directed   He may shower after the pain she is removed 3 days after surgery. Leave the incision alone.     No dressing needed    Complete by:  As directed             Medication List    TAKE these medications        acetaminophen 500 MG tablet  Commonly known as:  TYLENOL  Take 1,000 mg by mouth every 6 (six) hours as needed.     allopurinol 300 MG tablet  Commonly known as:  ZYLOPRIM  Take  300 mg by mouth daily.     fish oil-omega-3 fatty acids 1000 MG capsule  Take 2 g by mouth daily.     glucosamine-chondroitin 500-400 MG tablet  Take 1 tablet by mouth 3 (three) times daily.     HYDROcodone-acetaminophen 5-325 MG per tablet  Commonly known as:  NORCO/VICODIN  Take 1 tablet by mouth every 6 (six) hours as needed. for pain     HYDROcodone-acetaminophen 5-325 MG per tablet  Commonly known as:  NORCO/VICODIN  Take 1-2 tablets by mouth every 4 (four) hours as needed for moderate pain.     ketoconazole 2 % cream  Commonly known as:  NIZORAL  Apply 1 application topically as needed for irritation.     lisinopril 20 MG tablet  Commonly known as:  PRINIVIL,ZESTRIL  Take 20 mg by mouth daily.     multivitamin tablet  Take 1 tablet by mouth daily.     omeprazole 20 MG tablet  Commonly known as:  PRILOSEC OTC  Take 20 mg by mouth as needed (heartburn).      OVER THE COUNTER MEDICATION  Take 1 tablet by mouth daily as needed (immune systetm boost). OPC-3     vitamin B-12 100 MCG tablet  Commonly known as:  CYANOCOBALAMIN  Take 100 mcg by mouth daily.         SignedNewman Pies D 11/02/2014, 1:40 PM

## 2014-11-02 NOTE — Progress Notes (Signed)
Physical Therapy Treatment Patient Details Name: Brenda [name] MRN: [id number] DOB: 07-12-1930 Today's Date: 11/02/2014    History of Present Illness Patient is a 81 year old white female who has complained of back, buttock, and leg pain consistent with neurogenic claudication. Has failed medical management and was worked up with a lumbar MRI. This demonstrated multilevel spondylosis, stenosis and spondylolisthesis at L4-5.  Now s/p L2-4 bilat laminectomies and PLIF L4-5.    PT Comments    Pt well known to this PT from session on Friday and pt's mobility is very different than it was during Friday's session.  Pt requiring increased A for mobility and having difficulty attempting ambulating due to pain and generally feeling weak.  Continue to feel pt would benefit from SNF at D/C to maximize independence.  Will continue to follow.    Follow Up Recommendations  SNF     Equipment Recommendations  Rolling walker with 5" wheels    Recommendations for Other Services       Precautions / Restrictions Precautions Precautions: Back Precaution Comments: Reviewed back precautions.   Required Braces or Orthoses: Spinal Brace;Other Brace/Splint Spinal Brace: Lumbar corset;Applied in sitting position Other Brace/Splint: Neoprene knee brace. Restrictions Weight Bearing Restrictions: No    Mobility  Bed Mobility Overal bed mobility: Needs Assistance Bed Mobility: Rolling;Sidelying to Sit Rolling: Min assist Sidelying to sit: Mod assist       General bed mobility comments: cues and hand over hand for log roll technique.  A with bringing trunk up to sitting and movemnt of LEs.    Transfers Overall transfer level: Needs assistance Equipment used: Rolling walker (2 wheeled) Transfers: Sit to/from [facility] Sit to Stand: Min assist;+2 physical assistance Stand pivot transfers: Min assist;+2 physical assistance       General transfer comment: cues for UE use and safe  movemnt through pivot.  pt repeated pivot x2 for coming to commode and then to recliner.    Ambulation/Gait                 Stairs            Wheelchair Mobility    Modified Rankin (Stroke Patients Only)       Balance Overall balance assessment: Needs assistance Sitting-balance support: Bilateral upper extremity supported;Feet supported Sitting balance-Leahy Scale: Poor     Standing balance support: Bilateral upper extremity supported;During functional activity Standing balance-Leahy Scale: Poor                      Cognition Arousal/Alertness: Awake/alert Behavior During Therapy: WFL for tasks assessed/performed Overall Cognitive Status: Impaired/Different from baseline Area of Impairment: Memory     Memory: Decreased short-term memory;Decreased recall of precautions              Exercises      General Comments        Pertinent Vitals/Pain Pain Assessment: 0-10 Pain Score: 8  Pain Location: Back and Bil LEs Pain Descriptors / Indicators: Tightness;Sore Pain Intervention(s): Monitored during session;Premedicated before session;Repositioned    Home Living                      Prior Function            PT Goals (current goals can now be found in the care plan section) Acute Rehab PT Goals Patient Stated Goal: To return home PT Goal Formulation: With patient/family Time For Goal Achievement: 11/05/14 Potential to Achieve Goals: Good Progress  towards PT goals: Not progressing toward goals - comment (Requiring increased A)    Frequency  Min 5X/week    PT Plan Current plan remains appropriate    Co-evaluation             End of Session Equipment Utilized During Treatment: Gait belt;Back brace Activity Tolerance: Patient limited by fatigue;Patient limited by pain Patient left: in chair;with call bell/phone within reach;with family/visitor present     Time: 7858-8502 PT Time Calculation (min) (ACUTE ONLY): 23  min  Charges:  $Therapeutic Activity: 23-37 mins                    G CodesCatarina Hartshorn, [place] 11/02/2014, 10:40 AM

## 2014-11-02 NOTE — Clinical Social Work Placement (Signed)
   CLINICAL SOCIAL WORK PLACEMENT  NOTE  Date:  11/02/2014  Patient Details  Name: Brenda [name] MRN: [id number] Date of Birth: July 24, 1930  Clinical Social Work is seeking post-discharge placement for this patient at the [place] level of care (*CSW will initial, date and re-position this form in  chart as items are completed):  Yes   Patient/family provided with [place] Work Department's list of facilities offering this level of care within the geographic area requested by the patient (or if unable, by the patient's family).  Yes   Patient/family informed of their freedom to choose among providers that offer the needed level of care, that participate in Medicare, Medicaid or managed care program needed by the patient, have an available bed and are willing to accept the patient.  Yes   Patient/family informed of [facility]'s ownership interest in [facility] and [facility], as well as of the fact that they are under no obligation to receive care at these facilities.  PASRR submitted to EDS on 11/01/14     PASRR number received on 11/01/14     Existing PASRR number confirmed on       FL2 transmitted to all facilities in geographic area requested by pt/family on 11/01/14     FL2 transmitted to all facilities within larger geographic area on 11/01/14     Patient informed that his/her managed care company has contracts with or will negotiate with certain facilities, including the following:        Yes   Patient/family informed of bed offers received.  Patient chooses bed at  [facility] and [facility] )     Physician recommends and patient chooses bed at      Patient to be transferred to  [facility] [place] ) on 11/02/14.  Patient to be transferred to facility by  [facility] )     Patient family notified on 11/02/14 of transfer.  Name of family member notified:   (Pt's husband and dtr, [name])     PHYSICIAN Please  prepare prescriptions     Additional Comment:    _______________________________________________ Rozell Searing, LCSW 11/02/2014, 2:20 PM

## 2014-11-02 NOTE — Clinical Social Work Note (Signed)
Patient has a bed at SNF, [place].  FL-2 on chart for MD signature.   Glendon Axe, MSW, LCSWA [phone] 11/02/2014 1:17 PM

## 2014-11-03 ENCOUNTER — Non-Acute Institutional Stay (SKILLED_NURSING_FACILITY): Payer: Medicare Other | Admitting: Adult Health

## 2014-11-03 ENCOUNTER — Encounter: Payer: Self-pay | Admitting: Adult Health

## 2014-11-03 DIAGNOSIS — K219 Gastro-esophageal reflux disease without esophagitis: Secondary | ICD-10-CM

## 2014-11-03 DIAGNOSIS — D72829 Elevated white blood cell count, unspecified: Secondary | ICD-10-CM | POA: Diagnosis not present

## 2014-11-03 DIAGNOSIS — I10 Essential (primary) hypertension: Secondary | ICD-10-CM

## 2014-11-03 DIAGNOSIS — K5900 Constipation, unspecified: Secondary | ICD-10-CM

## 2014-11-03 DIAGNOSIS — M109 Gout, unspecified: Secondary | ICD-10-CM | POA: Diagnosis not present

## 2014-11-03 DIAGNOSIS — M4806 Spinal stenosis, lumbar region: Secondary | ICD-10-CM | POA: Diagnosis not present

## 2014-11-03 DIAGNOSIS — M48062 Spinal stenosis, lumbar region with neurogenic claudication: Secondary | ICD-10-CM

## 2014-11-03 NOTE — Progress Notes (Signed)
Patient ID: [place] Bing, female   DOB: 1931/03/25, 81 y.o.   MRN: [id number]   11/03/2014  Facility:  Nursing Home Location:  [place] Room Number: 3382-5 LEVEL OF CARE:  SNF (31)   Chief Complaint  Patient presents with  . Hospitalization Follow-up    Spinal lumbar stenosis with neurogenic claudication S/P L4-5 decompression and instrumentation and fusion with laminectomy, gout, hypertension, GERD, leukocytosis and constipation    HISTORY OF PRESENT ILLNESS:  This is an 81 year old female who has been admitted to [facility] on 11/02/14 from [facility] with Spinal lumbar stenosis with neurogenic claudication S/P L4-5 decompression instrumentation and fusion with laminectomy at L2-3, L3-4 and L4-5 on 10/28/14. She has PMH of hypertension, neuromuscular disorder, GERD, depression, basal cell skin cancer and arthritis.  She complains of constipation. She reports that her pain is well controlled.  She has been admitted for a short-term rehabilitation.  PAST MEDICAL HISTORY:  Past Medical History  Diagnosis Date  . Hypertension   . Neuromuscular disorder     numbness and tingling legs and feet  . Arthritis   . GERD (gastroesophageal reflux disease)   . Frequent urination     also during the night  . Depression   . Cancer     basal cell skin cancer    CURRENT MEDICATIONS: Reviewed per MAR/see medication list  No Known Allergies   REVIEW OF SYSTEMS:  GENERAL: no change in appetite, no fatigue, no weight changes, no fever, chills or weakness RESPIRATORY: no cough, SOB, DOE, wheezing, hemoptysis CARDIAC: no chest pain, edema or palpitations GI: no abdominal pain, diarrhea, heart burn, nausea or vomiting, +constipation  PHYSICAL EXAMINATION  GENERAL: no acute distress, normal body habitus SKIN:  Surgical incision on lower back is dry, covered with dry dressing, no erythema EYES: conjunctivae normal, sclerae normal, normal eye  lids NECK: supple, trachea midline, no neck masses, no thyroid tenderness, no thyromegaly LYMPHATICS: no LAN in the neck, no supraclavicular LAN RESPIRATORY: breathing is even & unlabored, BS CTAB CARDIAC: RRR, no murmur,no extra heart sounds, no edema GI: abdomen soft, normal BS, no masses, no tenderness, no hepatomegaly, no splenomegaly EXTREMITIES:  Able to move 4 extremities PSYCHIATRIC: the patient is alert & oriented to person, affect & behavior appropriate  LABS/RADIOLOGY: Labs reviewed: Basic Metabolic Panel:  Recent Labs  10/20/14 1259  NA 136  K 3.8  CL 100*  CO2 24  GLUCOSE 102*  BUN 15  CREATININE 0.94  CALCIUM 9.1   CBC:  Recent Labs  10/20/14 1259 10/30/14 1710  WBC 11.4* 18.8*  NEUTROABS  --  14.3*  HGB 14.5 11.4*  HCT 42.8 34.1*  MCV 89.0 92.7  PLT 263 200    Dg Chest 1 View  10/30/2014   CLINICAL DATA:  Low-grade fever. Weakness. Postop from lumbar spine surgery.  EXAM: CHEST  1 VIEW  COMPARISON:  01/27/2011  FINDINGS: The heart size and mediastinal contours are within normal limits. Both lungs are clear. No evidence of pneumothorax or pleural effusion. The visualized skeletal structures are unremarkable.  IMPRESSION: No active disease.   Electronically Signed   By: Earle Gell M.D.   On: 10/30/2014 17:27   Dg Lumbar Spine 2-3 Views  10/28/2014   CLINICAL DATA:  L4-5 PLIF.  EXAM: LUMBAR SPINE - 2-3 VIEW; DG C-ARM 61-120 MIN  COMPARISON:  10/28/2014  FLUOROSCOPY TIME:  0 minutes 24 seconds  FINDINGS: Examination demonstrates a surgical instrument with tip  over the mid disc space at the L4-5 level. There is mild spondylosis of the lumbar spine. Subsequent images demonstrate placement of posterior spinal fusion hardware with pedicle screws at the L4-5 level which is intact. Intervertebral cage is intact and appropriately positioned at the L4-5 level. Recommend correlation with findings at the time of the procedure.  IMPRESSION: Posterior spinal fusion  hardware intact at the L4-5 level with intervertebral cage at the L4-5 disc space.   Electronically Signed   By: Marin Olp M.D.   On: 10/28/2014 13:34   Dg Lumbar Spine 2-3 Views  10/28/2014   CLINICAL DATA:  For L4-5 posterior lumbar interbody fusion, localization  EXAM: LUMBAR SPINE - 2-3 VIEW  COMPARISON:  Lumbar spine films from [facility]  FINDINGS: Localization lateral view labeled 1 shows and instrument posteriorly directed toward the posterior inferior body of L5 with the clamp on the spinous process of L4.  Localization film labeled 2 shows and instrument directed toward the L3-4 interspace.  IMPRESSION: Localization of L3-4 on the last film obtained.   Electronically Signed   By: Ivar Drape M.D.   On: 10/28/2014 11:23   Dg C-arm 1-60 Min  10/28/2014   CLINICAL DATA:  L4-5 PLIF.  EXAM: LUMBAR SPINE - 2-3 VIEW; DG C-ARM 61-120 MIN  COMPARISON:  10/28/2014  FLUOROSCOPY TIME:  0 minutes 24 seconds  FINDINGS: Examination demonstrates a surgical instrument with tip over the mid disc space at the L4-5 level. There is mild spondylosis of the lumbar spine. Subsequent images demonstrate placement of posterior spinal fusion hardware with pedicle screws at the L4-5 level which is intact. Intervertebral cage is intact and appropriately positioned at the L4-5 level. Recommend correlation with findings at the time of the procedure.  IMPRESSION: Posterior spinal fusion hardware intact at the L4-5 level with intervertebral cage at the L4-5 disc space.   Electronically Signed   By: Marin Olp M.D.   On: 10/28/2014 13:34    ASSESSMENT/PLAN:  Spinal lumbar stenosis with neurogenic claudication S/P L4-5 compression instrumentation and fusion with laminectomy at L2-3, L3-4 and L4-5 - for rehabilitation; continue Norco 5/325 mg 1 tab by mouth every 6 hours when necessary and Tylenol 500 mg 2 tabs = 1000 mg by mouth every 6 hours when necessary for pain; follow-up with Dr. Newman Pies,  nerosurgery Gout - continue allopurinol 300 mg by mouth daily Hypertension - well controlled; continue lisinopril 20 mg 1 tab by mouth daily GERD - continue Prilosec 20 mg 1 tab by mouth daily when necessary Leukocytosis - wbc 18.8; will monitor Constipation - start senna S2 tabs by mouth twice a day and MiraLAX 17 g +4-6 ounces liquid by mouth daily    Goals of care:  Short-term rehabilitation   Labs/test ordered:  CBC and CMP next lab draw  Spent 50 minutes in patient care.   [facility], NP [employer] [phone]

## 2014-11-04 ENCOUNTER — Non-Acute Institutional Stay (SKILLED_NURSING_FACILITY): Payer: Medicare Other | Admitting: Internal Medicine

## 2014-11-04 DIAGNOSIS — K219 Gastro-esophageal reflux disease without esophagitis: Secondary | ICD-10-CM | POA: Diagnosis not present

## 2014-11-04 DIAGNOSIS — K5900 Constipation, unspecified: Secondary | ICD-10-CM

## 2014-11-04 DIAGNOSIS — M4806 Spinal stenosis, lumbar region: Secondary | ICD-10-CM

## 2014-11-04 DIAGNOSIS — M1A9XX0 Chronic gout, unspecified, without tophus (tophi): Secondary | ICD-10-CM

## 2014-11-04 DIAGNOSIS — M48062 Spinal stenosis, lumbar region with neurogenic claudication: Secondary | ICD-10-CM

## 2014-11-04 DIAGNOSIS — I10 Essential (primary) hypertension: Secondary | ICD-10-CM

## 2014-11-04 DIAGNOSIS — D72829 Elevated white blood cell count, unspecified: Secondary | ICD-10-CM | POA: Diagnosis not present

## 2014-11-04 DIAGNOSIS — E538 Deficiency of other specified B group vitamins: Secondary | ICD-10-CM | POA: Diagnosis not present

## 2014-11-04 LAB — CULTURE, BLOOD (ROUTINE X 2)
CULTURE: NO GROWTH
Culture: NO GROWTH

## 2014-11-04 NOTE — Progress Notes (Signed)
Patient ID: [place] Bing, female   DOB: 30-Jan-1931, 81 y.o.   MRN: [id number]     [facility]   PCP: ARONSON,RICHARD A, MD  Code Status: full code  No Known Allergies  Chief Complaint  Patient presents with  . New Admit To SNF     HPI:  81 year old patient is here for short term rehabilitation post hospital admission from 10/28/2014-11/02/2014 with L4-5 spondylolisthesis, L2-3, L3-4 and L4-5 spinal stenosis and lumbar radiculopathy with neurogenic claudication. she underwent L4-5 decompression instrumentation and fusion with laminectomy at L2-3, L3-4 and L4-5. She tolerated the procedure well. She is seen in her room with her daughter at bedside. Her pain is under control. She has occasional muscle spasm. Denies any other concerns. Her daughter wants to go over her medication list.  Review of Systems:  Constitutional: Negative for fever, chills, diaphoresis.  HENT: Negative for headache, congestion, nasal discharge Eyes: Negative for eye pain, blurred vision, double vision and discharge.  Respiratory: Negative for cough, shortness of breath and wheezing.   Cardiovascular: Negative for chest pain, palpitations, leg swelling.  Gastrointestinal: Negative for heartburn, nausea, vomiting, abdominal pain Genitourinary: Negative for dysuria Musculoskeletal: Negative for back pain, falls Skin: Negative for itching, rash.  Neurological: Negative for dizziness, tingling or numbness in his arm and legs Psychiatric/Behavioral: Negative for depression   Past Medical History  Diagnosis Date  . Hypertension   . Neuromuscular disorder     numbness and tingling legs and feet  . Arthritis   . GERD (gastroesophageal reflux disease)   . Frequent urination     also during the night  . Depression   . Cancer     basal cell skin cancer   Past Surgical History  Procedure Laterality Date  . Spine surgery    . Carpal tunnel release    . Back surgery    .  Arthroscopic knee Right   . Removal skin cancer      basal cell   Social History:   reports that she has never smoked. She has never used smokeless tobacco. She reports that she does not drink alcohol or use illicit drugs.  Family History  Problem Relation Age of Onset  . Arthritis Mother   . Cancer Father     Medications: Patient's Medications  New Prescriptions   No medications on file  Previous Medications   ACETAMINOPHEN (TYLENOL) 500 MG TABLET    Take 1,000 mg by mouth every 6 (six) hours as needed.   ALLOPURINOL (ZYLOPRIM) 300 MG TABLET    Take 300 mg by mouth daily.    FISH OIL-OMEGA-3 FATTY ACIDS 1000 MG CAPSULE    Take 2 g by mouth daily.   GLUCOSAMINE-CHONDROITIN 500-400 MG TABLET    Take 1 tablet by mouth 3 (three) times daily.   HYDROCODONE-ACETAMINOPHEN (NORCO/VICODIN) 5-325 MG PER TABLET    Take 1 tablet by mouth every 6 (six) hours as needed. for pain   HYDROCODONE-ACETAMINOPHEN (NORCO/VICODIN) 5-325 MG PER TABLET    Take 1-2 tablets by mouth every 4 (four) hours as needed for moderate pain.   KETOCONAZOLE (NIZORAL) 2 % CREAM    Apply 1 application topically as needed for irritation.   LISINOPRIL (PRINIVIL,ZESTRIL) 20 MG TABLET    Take 20 mg by mouth daily.   MULTIPLE VITAMIN (MULTIVITAMIN) TABLET    Take 1 tablet by mouth daily.   OMEPRAZOLE (PRILOSEC OTC) 20 MG TABLET    Take 20 mg by mouth  as needed (heartburn).    OVER THE COUNTER MEDICATION    Take 1 tablet by mouth daily as needed (immune systetm boost). OPC-3   VITAMIN B-12 (CYANOCOBALAMIN) 100 MCG TABLET    Take 100 mcg by mouth daily.  Modified Medications   No medications on file  Discontinued Medications   No medications on file     Physical Exam: Filed Vitals:   11/04/14 1008  BP: 130/60  Pulse: 88  Temp: 98 F (36.7 C)  Resp: 18  Weight: 167 lb 6.4 oz (75.932 kg)  SpO2: 98%    General- elderly female, in no acute distress Head- normocephalic, atraumatic Throat- moist mucus membrane Eyes-   no pallor, no icterus, no discharge, normal conjunctiva, normal sclera Neck- no cervical lymphadenopathy Cardiovascular- normal s1,s2, no murmurs, palpable dorsalis pedis, no leg edema Respiratory- bilateral clear to auscultation, no wheeze, no rhonchi, no crackles, no use of accessory muscles Abdomen- bowel sounds present, soft, non tender Musculoskeletal- able to move all 4 extremities, generalized weakness  Neurological- no focal deficit Skin- warm and dry, surgical incision on lumbar region with steri strips, healing well, no signs of infection Psychiatry- alert and oriented to person, place and time, normal mood and affect    Labs reviewed: Basic Metabolic Panel:  Recent Labs  10/20/14 1259  NA 136  K 3.8  CL 100*  CO2 24  GLUCOSE 102*  BUN 15  CREATININE 0.94  CALCIUM 9.1   CBC:  Recent Labs  10/20/14 1259 10/30/14 1710  WBC 11.4* 18.8*  NEUTROABS  --  14.3*  HGB 14.5 11.4*  HCT 42.8 34.1*  MCV 89.0 92.7  PLT 263 200     Assessment/Plan  Lumbar stenosis with neurogenic claudication S/P L4-5 compression instrumentation and fusion with laminectomy at L2-3, L3-4 and L4-5. Pain under control with current regimen. Continue norco 5-325 1 tab q4h prn pain with tylenol 1 g q6h prn. Has follow up with neurosurgery dr Arnoldo Morale. Will have her work with physical therapy and occupational therapy team to help with gait training and muscle strengthening exercises.fall precautions. Skin care. Encourage to be out of bed.   Leukocytosis No signs of infection on exam. Check cbc with diff  Blood loss anemia Monitor h&h, post op from blood loss  HTN Stable, continue lisinopril 20 mg daily  gout No recent flare up. continue allopurinol 300 mg daily for now. Check uric acid level as pt wants to come off allopurinol, if level < 6, can decrease the dose  GERD Stable, continue Prilosec 20 mg daily prn  Constipation Continue senna S 2 tabs bid and miralax daily  b12  def Continue b12 supplement   Goals of care: short term rehabilitation   Labs/tests ordered: cbc cmp  Family/ staff Communication: reviewed care plan with patient and nursing supervisor    Blanchie Serve, MD  [facility] [phone] (Monday-Friday 8 am - 5 pm) [phone] (afterhours)

## 2014-11-17 ENCOUNTER — Encounter: Payer: Self-pay | Admitting: Adult Health

## 2014-11-17 ENCOUNTER — Non-Acute Institutional Stay (SKILLED_NURSING_FACILITY): Payer: Medicare Other | Admitting: Adult Health

## 2014-11-17 DIAGNOSIS — K219 Gastro-esophageal reflux disease without esophagitis: Secondary | ICD-10-CM

## 2014-11-17 DIAGNOSIS — E538 Deficiency of other specified B group vitamins: Secondary | ICD-10-CM | POA: Diagnosis not present

## 2014-11-17 DIAGNOSIS — I10 Essential (primary) hypertension: Secondary | ICD-10-CM | POA: Diagnosis not present

## 2014-11-17 DIAGNOSIS — E43 Unspecified severe protein-calorie malnutrition: Secondary | ICD-10-CM | POA: Diagnosis not present

## 2014-11-17 DIAGNOSIS — K5900 Constipation, unspecified: Secondary | ICD-10-CM | POA: Diagnosis not present

## 2014-11-17 DIAGNOSIS — M48062 Spinal stenosis, lumbar region with neurogenic claudication: Secondary | ICD-10-CM

## 2014-11-17 DIAGNOSIS — M4806 Spinal stenosis, lumbar region: Secondary | ICD-10-CM | POA: Diagnosis not present

## 2014-11-17 DIAGNOSIS — M1A9XX0 Chronic gout, unspecified, without tophus (tophi): Secondary | ICD-10-CM | POA: Diagnosis not present

## 2014-11-17 NOTE — Progress Notes (Signed)
Patient ID: Brenda [name], female   DOB: 1931/01/06, 81 y.o.   MRN: [id number]   11/17/2014  Facility:  Nursing Home Location:  [place] Room Number: 834-1 LEVEL OF CARE:  SNF (31)   Chief Complaint  Patient presents with  . Discharge Note    Spinal lumbar stenosis with neurogenic claudication S/P L4-5 decompression and instrumentation and fusion with laminectomy, gout, hypertension, GERD, vitamin B 12 deficiency and constipation    HISTORY OF PRESENT ILLNESS:  This is an 81 year old female who is for discharge home with Home health PT for endurance, OT for ADLs and CNA for showers. She has been admitted to [facility] on 11/02/14 from [facility] with Spinal lumbar stenosis with neurogenic claudication S/P L4-5 decompression instrumentation and fusion with laminectomy at L2-3, L3-4 and L4-5 on 10/28/14. She has PMH of hypertension, neuromuscular disorder, GERD, depression, basal cell skin cancer and arthritis. Noted BPs to be elevated 171/84, 184/81, 182/88 and 163/85. No complaints of headache nor dizziness.  Patient was admitted to this facility for short-term rehabilitation after the patient's recent hospitalization.  Patient has completed SNF rehabilitation and therapy has cleared the patient for discharge.  PAST MEDICAL HISTORY:  Past Medical History  Diagnosis Date  . Hypertension   . Neuromuscular disorder     numbness and tingling legs and feet  . Arthritis   . GERD (gastroesophageal reflux disease)   . Frequent urination     also during the night  . Depression   . Cancer     basal cell skin cancer    CURRENT MEDICATIONS: Reviewed per MAR/see medication list  No Known Allergies   REVIEW OF SYSTEMS:  GENERAL: no change in appetite, no fatigue, no weight changes, no fever, chills or weakness RESPIRATORY: no cough, SOB, DOE, wheezing, hemoptysis CARDIAC: no chest pain, edema or palpitations GI: no abdominal pain, diarrhea,  heart burn, nausea or vomiting, +constipation  PHYSICAL EXAMINATION  GENERAL: no acute distress, normal body habitus SKIN:  Surgical incision on lower back is dry, covered with steri-strips and dry dressing, no erythema NECK: supple, trachea midline, no neck masses, no thyroid tenderness, no thyromegaly LYMPHATICS: no LAN in the neck, no supraclavicular LAN RESPIRATORY: breathing is even & unlabored, BS CTAB CARDIAC: RRR, no murmur,no extra heart sounds, no edema GI: abdomen soft, normal BS, no masses, no tenderness, no hepatomegaly, no splenomegaly EXTREMITIES:  Able to move 4 extremities PSYCHIATRIC: the patient is alert & oriented to person, affect & behavior appropriate  LABS/RADIOLOGY: Labs reviewed: 11/05/14  uric acid 4.3 11/04/14  WBC 8.3 hemoglobin 10.5 hematocrit 31.7 MCV 91.4 platelet 377 sodium 135 potassium 4.3 glucose 130 and 13 creatinine 0.88 total bilirubin 0.4 alkaline phosphatase 95 SGOT 59 SGPT 57 total protein 5.7 albumin 2.6 calcium 8.3  Basic Metabolic Panel:  Recent Labs  10/20/14 1259  NA 136  K 3.8  CL 100*  CO2 24  GLUCOSE 102*  BUN 15  CREATININE 0.94  CALCIUM 9.1   CBC:  Recent Labs  10/20/14 1259 10/30/14 1710  WBC 11.4* 18.8*  NEUTROABS  --  14.3*  HGB 14.5 11.4*  HCT 42.8 34.1*  MCV 89.0 92.7  PLT 263 200    Dg Chest 1 View  10/30/2014   CLINICAL DATA:  Low-grade fever. Weakness. Postop from lumbar spine surgery.  EXAM: CHEST  1 VIEW  COMPARISON:  01/27/2011  FINDINGS: The heart size and mediastinal contours are within normal limits. Both lungs are  clear. No evidence of pneumothorax or pleural effusion. The visualized skeletal structures are unremarkable.  IMPRESSION: No active disease.   Electronically Signed   By: Earle Gell M.D.   On: 10/30/2014 17:27   Dg Lumbar Spine 2-3 Views  10/28/2014   CLINICAL DATA:  L4-5 PLIF.  EXAM: LUMBAR SPINE - 2-3 VIEW; DG C-ARM 61-120 MIN  COMPARISON:  10/28/2014  FLUOROSCOPY TIME:  0 minutes 24  seconds  FINDINGS: Examination demonstrates a surgical instrument with tip over the mid disc space at the L4-5 level. There is mild spondylosis of the lumbar spine. Subsequent images demonstrate placement of posterior spinal fusion hardware with pedicle screws at the L4-5 level which is intact. Intervertebral cage is intact and appropriately positioned at the L4-5 level. Recommend correlation with findings at the time of the procedure.  IMPRESSION: Posterior spinal fusion hardware intact at the L4-5 level with intervertebral cage at the L4-5 disc space.   Electronically Signed   By: Marin Olp M.D.   On: 10/28/2014 13:34   Dg Lumbar Spine 2-3 Views  10/28/2014   CLINICAL DATA:  For L4-5 posterior lumbar interbody fusion, localization  EXAM: LUMBAR SPINE - 2-3 VIEW  COMPARISON:  Lumbar spine films from [facility]  FINDINGS: Localization lateral view labeled 1 shows and instrument posteriorly directed toward the posterior inferior body of L5 with the clamp on the spinous process of L4.  Localization film labeled 2 shows and instrument directed toward the L3-4 interspace.  IMPRESSION: Localization of L3-4 on the last film obtained.   Electronically Signed   By: Ivar Drape M.D.   On: 10/28/2014 11:23   Dg C-arm 1-60 Min  10/28/2014   CLINICAL DATA:  L4-5 PLIF.  EXAM: LUMBAR SPINE - 2-3 VIEW; DG C-ARM 61-120 MIN  COMPARISON:  10/28/2014  FLUOROSCOPY TIME:  0 minutes 24 seconds  FINDINGS: Examination demonstrates a surgical instrument with tip over the mid disc space at the L4-5 level. There is mild spondylosis of the lumbar spine. Subsequent images demonstrate placement of posterior spinal fusion hardware with pedicle screws at the L4-5 level which is intact. Intervertebral cage is intact and appropriately positioned at the L4-5 level. Recommend correlation with findings at the time of the procedure.  IMPRESSION: Posterior spinal fusion hardware intact at the L4-5 level with intervertebral  cage at the L4-5 disc space.   Electronically Signed   By: Marin Olp M.D.   On: 10/28/2014 13:34    ASSESSMENT/PLAN:  Spinal lumbar stenosis with neurogenic claudication S/P L4-5 compression instrumentation and fusion with laminectomy at L2-3, L3-4 and L4-5 - for home health PT, OT and CNA; continue Norco 5/325 mg 1 tab by mouth every 6 hours when necessary and Tylenol 500 mg 2 tabs = 1000 mg by mouth every 6 hours when necessary for pain; follow-up with Dr. Newman Pies, nerosurgery Gout - continue allopurinol 300 mg by mouth daily Hypertension - increase lisinopril to 30 mg 1 tab by mouth daily; BP Q shift X 1 week GERD - continue Prilosec 20 mg 1 tab by mouth daily when necessary Leukocytosis - wbc 8.3; resolved Constipation -continue senna S2 tabs by mouth twice a day and MiraLAX 17 g +4-6 ounces liquid by mouth daily Vitamin B12 deficiency - continue vitamin B12 100 g 1 tab by mouth daily Protein-calorie malnutrition, severe - albumin 2.6; continue supplementation     I have filled out patient's discharge paperwork and written prescriptions.  Patient will receive home health PT, OT  and CNA.  Total discharge time: Less than 30 minutes  Discharge time involved coordination of the discharge process with Education officer, museum, nursing staff and therapy department. Medical justification for home health services verified.   [facility], NP [employer] [phone]

## 2014-12-12 ENCOUNTER — Encounter (HOSPITAL_COMMUNITY): Payer: Self-pay | Admitting: Emergency Medicine

## 2014-12-12 ENCOUNTER — Emergency Department (HOSPITAL_COMMUNITY): Payer: Medicare Other

## 2014-12-12 ENCOUNTER — Inpatient Hospital Stay (HOSPITAL_COMMUNITY)
Admission: EM | Admit: 2014-12-12 | Discharge: 2014-12-14 | DRG: 872 | Disposition: A | Discharge status: Home Health | Payer: Medicare Other | Attending: Internal Medicine | Admitting: Internal Medicine

## 2014-12-12 DIAGNOSIS — A419 Sepsis, unspecified organism: Secondary | ICD-10-CM | POA: Diagnosis present

## 2014-12-12 DIAGNOSIS — R350 Frequency of micturition: Secondary | ICD-10-CM | POA: Diagnosis present

## 2014-12-12 DIAGNOSIS — Z85828 Personal history of other malignant neoplasm of skin: Secondary | ICD-10-CM

## 2014-12-12 DIAGNOSIS — I10 Essential (primary) hypertension: Secondary | ICD-10-CM | POA: Diagnosis present

## 2014-12-12 DIAGNOSIS — Z809 Family history of malignant neoplasm, unspecified: Secondary | ICD-10-CM | POA: Diagnosis not present

## 2014-12-12 DIAGNOSIS — N390 Urinary tract infection, site not specified: Secondary | ICD-10-CM | POA: Diagnosis present

## 2014-12-12 DIAGNOSIS — Z9889 Other specified postprocedural states: Secondary | ICD-10-CM

## 2014-12-12 DIAGNOSIS — Z8261 Family history of arthritis: Secondary | ICD-10-CM

## 2014-12-12 DIAGNOSIS — G8918 Other acute postprocedural pain: Secondary | ICD-10-CM | POA: Diagnosis present

## 2014-12-12 DIAGNOSIS — Z79899 Other long term (current) drug therapy: Secondary | ICD-10-CM | POA: Diagnosis not present

## 2014-12-12 DIAGNOSIS — G709 Myoneural disorder, unspecified: Secondary | ICD-10-CM | POA: Diagnosis present

## 2014-12-12 DIAGNOSIS — F329 Major depressive disorder, single episode, unspecified: Secondary | ICD-10-CM | POA: Diagnosis present

## 2014-12-12 DIAGNOSIS — R112 Nausea with vomiting, unspecified: Secondary | ICD-10-CM | POA: Diagnosis not present

## 2014-12-12 DIAGNOSIS — R51 Headache: Secondary | ICD-10-CM | POA: Diagnosis present

## 2014-12-12 DIAGNOSIS — K219 Gastro-esophageal reflux disease without esophagitis: Secondary | ICD-10-CM | POA: Diagnosis present

## 2014-12-12 DIAGNOSIS — D649 Anemia, unspecified: Secondary | ICD-10-CM | POA: Diagnosis present

## 2014-12-12 DIAGNOSIS — M1990 Unspecified osteoarthritis, unspecified site: Secondary | ICD-10-CM | POA: Diagnosis present

## 2014-12-12 DIAGNOSIS — R509 Fever, unspecified: Secondary | ICD-10-CM

## 2014-12-12 LAB — I-STAT TROPONIN, ED: Troponin i, poc: 0.01 ng/mL (ref 0.00–0.08)

## 2014-12-12 LAB — CBC WITH DIFFERENTIAL/PLATELET
Basophils Absolute: 0 10*3/uL (ref 0.0–0.1)
Basophils Relative: 0 % (ref 0–1)
EOS PCT: 0 % (ref 0–5)
Eosinophils Absolute: 0 10*3/uL (ref 0.0–0.7)
HEMATOCRIT: 35.1 % — AB (ref 36.0–46.0)
HEMOGLOBIN: 11.7 g/dL — AB (ref 12.0–15.0)
LYMPHS PCT: 5 % — AB (ref 12–46)
Lymphs Abs: 1 10*3/uL (ref 0.7–4.0)
MCH: 28.9 pg (ref 26.0–34.0)
MCHC: 33.3 g/dL (ref 30.0–36.0)
MCV: 86.7 fL (ref 78.0–100.0)
MONOS PCT: 4 % (ref 3–12)
Monocytes Absolute: 0.8 10*3/uL (ref 0.1–1.0)
NEUTROS PCT: 91 % — AB (ref 43–77)
Neutro Abs: 18.4 10*3/uL — ABNORMAL HIGH (ref 1.7–7.7)
Platelets: 493 10*3/uL — ABNORMAL HIGH (ref 150–400)
RBC: 4.05 MIL/uL (ref 3.87–5.11)
RDW: 14.1 % (ref 11.5–15.5)
WBC: 20.3 10*3/uL — AB (ref 4.0–10.5)

## 2014-12-12 LAB — URINALYSIS, ROUTINE W REFLEX MICROSCOPIC
BILIRUBIN URINE: NEGATIVE
GLUCOSE, UA: NEGATIVE mg/dL
Ketones, ur: NEGATIVE mg/dL
Nitrite: NEGATIVE
Protein, ur: NEGATIVE mg/dL
SPECIFIC GRAVITY, URINE: 1.015 (ref 1.005–1.030)
UROBILINOGEN UA: 0.2 mg/dL (ref 0.0–1.0)
pH: 6 (ref 5.0–8.0)

## 2014-12-12 LAB — COMPREHENSIVE METABOLIC PANEL
ALK PHOS: 88 U/L (ref 38–126)
ALT: 21 U/L (ref 14–54)
AST: 33 U/L (ref 15–41)
Albumin: 2.9 g/dL — ABNORMAL LOW (ref 3.5–5.0)
Anion gap: 13 (ref 5–15)
BUN: 12 mg/dL (ref 6–20)
CO2: 25 mmol/L (ref 22–32)
Calcium: 8.8 mg/dL — ABNORMAL LOW (ref 8.9–10.3)
Chloride: 92 mmol/L — ABNORMAL LOW (ref 101–111)
Creatinine, Ser: 0.97 mg/dL (ref 0.44–1.00)
GFR calc non Af Amer: 53 mL/min — ABNORMAL LOW (ref >60)
Glucose, Bld: 115 mg/dL — ABNORMAL HIGH (ref 65–99)
POTASSIUM: 4.4 mmol/L (ref 3.5–5.1)
Sodium: 130 mmol/L — ABNORMAL LOW (ref 135–145)
TOTAL PROTEIN: 7.4 g/dL (ref 6.5–8.1)
Total Bilirubin: 0.9 mg/dL (ref 0.3–1.2)

## 2014-12-12 LAB — I-STAT CG4 LACTIC ACID, ED
LACTIC ACID, VENOUS: 0.65 mmol/L (ref 0.5–2.0)
Lactic Acid, Venous: 1.1 mmol/L (ref 0.5–2.0)

## 2014-12-12 LAB — URINE MICROSCOPIC-ADD ON

## 2014-12-12 LAB — CBG MONITORING, ED: Glucose-Capillary: 113 mg/dL — ABNORMAL HIGH (ref 65–99)

## 2014-12-12 MED ORDER — CEFTRIAXONE SODIUM 2 G IJ SOLR
2.0000 g | Freq: Once | INTRAMUSCULAR | Status: AC
  Administered 2014-12-12: 2 g via INTRAVENOUS
  Filled 2014-12-12: qty 2

## 2014-12-12 MED ORDER — DEXTROSE 5 % IV SOLN
1.0000 g | INTRAVENOUS | Status: DC
  Administered 2014-12-13: 1 g via INTRAVENOUS
  Filled 2014-12-12 (×2): qty 10

## 2014-12-12 MED ORDER — POLYETHYLENE GLYCOL 3350 17 G PO PACK: 17.0000 g | PACK | Freq: Every day | ORAL | Status: DC | PRN

## 2014-12-12 MED ORDER — SODIUM CHLORIDE 0.9 % IV BOLUS (SEPSIS)
1000.0000 mL | Freq: Once | INTRAVENOUS | Status: AC
  Administered 2014-12-12: 1000 mL via INTRAVENOUS

## 2014-12-12 MED ORDER — SODIUM CHLORIDE 0.9 % IV SOLN
INTRAVENOUS | Status: DC
  Administered 2014-12-12 – 2014-12-14 (×3): via INTRAVENOUS

## 2014-12-12 MED ORDER — HYDROCODONE-ACETAMINOPHEN 5-325 MG PO TABS
1.0000 | ORAL_TABLET | ORAL | Status: DC | PRN
  Administered 2014-12-13 – 2014-12-14 (×5): 1 via ORAL
  Filled 2014-12-12 (×3): qty 1
  Filled 2014-12-12: qty 2
  Filled 2014-12-12 (×2): qty 1

## 2014-12-12 MED ORDER — LISINOPRIL 20 MG PO TABS
20.0000 mg | ORAL_TABLET | Freq: Every day | ORAL | Status: DC
  Administered 2014-12-13 – 2014-12-14 (×2): 20 mg via ORAL
  Filled 2014-12-12 (×2): qty 1

## 2014-12-12 MED ORDER — ACETAMINOPHEN 325 MG PO TABS: 650.0000 mg | ORAL_TABLET | Freq: Once | ORAL | Status: DC | PRN

## 2014-12-12 MED ORDER — ALLOPURINOL 300 MG PO TABS
300.0000 mg | ORAL_TABLET | Freq: Every day | ORAL | Status: DC
  Administered 2014-12-12 – 2014-12-14 (×3): 300 mg via ORAL
  Filled 2014-12-12 (×3): qty 1

## 2014-12-12 MED ORDER — ONDANSETRON HCL 4 MG/2ML IJ SOLN: 4.0000 mg | Freq: Four times a day (QID) | INTRAMUSCULAR | Status: DC | PRN

## 2014-12-12 MED ORDER — ENOXAPARIN SODIUM 40 MG/0.4ML ~~LOC~~ SOLN
40.0000 mg | SUBCUTANEOUS | Status: DC
  Administered 2014-12-13: 40 mg via SUBCUTANEOUS
  Filled 2014-12-12 (×2): qty 0.4

## 2014-12-12 MED ORDER — SODIUM CHLORIDE 0.9 % IJ SOLN: 3.0000 mL | Freq: Two times a day (BID) | INTRAMUSCULAR | Status: DC

## 2014-12-12 MED ORDER — KETOROLAC TROMETHAMINE 30 MG/ML IJ SOLN
15.0000 mg | Freq: Once | INTRAMUSCULAR | Status: AC
  Administered 2014-12-12: 15 mg via INTRAVENOUS
  Filled 2014-12-12: qty 1

## 2014-12-12 MED ORDER — PANTOPRAZOLE SODIUM 40 MG PO TBEC: 40.0000 mg | DELAYED_RELEASE_TABLET | Freq: Every day | ORAL | Status: DC | PRN

## 2014-12-12 MED ORDER — SENNOSIDES-DOCUSATE SODIUM 8.6-50 MG PO TABS
1.0000 | ORAL_TABLET | Freq: Every day | ORAL | Status: DC
  Administered 2014-12-12 – 2014-12-13 (×2): 1 via ORAL
  Filled 2014-12-12 (×3): qty 1

## 2014-12-12 MED ORDER — FENTANYL CITRATE (PF) 100 MCG/2ML IJ SOLN
50.0000 ug | Freq: Once | INTRAMUSCULAR | Status: AC
  Administered 2014-12-12: 50 ug via INTRAVENOUS
  Filled 2014-12-12: qty 2

## 2014-12-12 MED ORDER — ACETAMINOPHEN 500 MG PO TABS: 1000.0000 mg | ORAL_TABLET | Freq: Four times a day (QID) | ORAL | Status: DC | PRN

## 2014-12-12 MED ORDER — ONDANSETRON HCL 4 MG/2ML IJ SOLN
4.0000 mg | Freq: Once | INTRAMUSCULAR | Status: AC
  Administered 2014-12-12: 4 mg via INTRAVENOUS
  Filled 2014-12-12: qty 2

## 2014-12-12 MED ORDER — ACETAMINOPHEN 650 MG RE SUPP
650.0000 mg | Freq: Once | RECTAL | Status: AC
  Administered 2014-12-12: 650 mg via RECTAL
  Filled 2014-12-12: qty 1

## 2014-12-12 NOTE — Progress Notes (Signed)
Called ED for report.  ED RN will call back.  Room ready.  Earleen Reaper RN-BC, [employer]

## 2014-12-12 NOTE — Progress Notes (Signed)
Called and rec'd report.  Room ready.  [employer] RN-BC, [facility].

## 2014-12-12 NOTE — ED Notes (Signed)
Patient comes from home with complaints of Fever and vomiting x14 hours. Patient states she is able to get crackers and ginger ale down but nothing else. Patient family describes vomit as yellow patient states she ate apple sauce.) Patient states she took Hydrocodine this morning and was able to keep it down. Patient family states last time she vomited at 11 am. Patient alert & o x4  On arrival.

## 2014-12-12 NOTE — H&P (Addendum)
[facility] History and Physical  Brenda [name] EQA:[id number] DOB: Aug 30, 1930 DOA: 12/12/2014  Referring physician: EDP PCP: Geoffery Lyons, MD   Chief Complaint: Fever, vomiting   HPI: Brenda [name] is a 81 y.o. female who presents to the ED with c/o fever and vomiting for the past 24 hours or so.  Symptoms onset last night.  Numerous episodes of vomiting.  Associated mild to moderate headache.  Tm 102.7 at home (102.4 here in ED).  No abd pain, chest pain, diarrhea, cough.  Review of Systems: Systems reviewed.  As above, otherwise negative  Past Medical History  Diagnosis Date  . Hypertension   . Neuromuscular disorder     numbness and tingling legs and feet  . Arthritis   . GERD (gastroesophageal reflux disease)   . Frequent urination     also during the night  . Depression   . Cancer     basal cell skin cancer   Past Surgical History  Procedure Laterality Date  . Spine surgery    . Carpal tunnel release    . Back surgery    . Arthroscopic knee Right   . Removal skin cancer      basal cell   Social History:  reports that she has never smoked. She has never used smokeless tobacco. She reports that she does not drink alcohol or use illicit drugs.  No Known Allergies  Family History  Problem Relation Age of Onset  . Arthritis Mother   . Cancer Father      Prior to Admission medications   Medication Sig Start Date End Date Taking? Authorizing Provider  acetaminophen (TYLENOL) 500 MG tablet Take 1,000 mg by mouth every 6 (six) hours as needed for moderate pain.    Yes Historical Provider, MD  allopurinol (ZYLOPRIM) 300 MG tablet Take 300 mg by mouth daily.  08/04/11  Yes Historical Provider, MD  fish oil-omega-3 fatty acids 1000 MG capsule Take 2 g by mouth daily.   Yes Historical Provider, MD  glucosamine-chondroitin 500-400 MG tablet Take 2 tablets by mouth 3 (three) times daily.    Yes Historical Provider, MD  HYDROcodone-acetaminophen (NORCO/VICODIN)  5-325 MG per tablet Take 1-2 tablets by mouth every 4 (four) hours as needed for moderate pain. 11/02/14  Yes Newman Pies, MD  lisinopril (PRINIVIL,ZESTRIL) 20 MG tablet Take 20 mg by mouth daily.   Yes Historical Provider, MD  Multiple Vitamin (MULTIVITAMIN) tablet Take 1 tablet by mouth daily.   Yes Historical Provider, MD  omeprazole (PRILOSEC OTC) 20 MG tablet Take 20 mg by mouth as needed (heartburn).    Yes Historical Provider, MD  OVER THE COUNTER MEDICATION Take 1 tablet by mouth daily as needed (immune systetm boost). OPC-3   Yes Historical Provider, MD  polyethylene glycol (MIRALAX / GLYCOLAX) packet Take 17 g by mouth daily as needed for moderate constipation.   Yes Historical Provider, MD  PROTEIN PO Take 1 Container by mouth daily.   Yes Historical Provider, MD  sennosides-docusate sodium (SENOKOT-S) 8.6-50 MG tablet Take 1 tablet by mouth daily.   Yes Historical Provider, MD  vitamin B-12 (CYANOCOBALAMIN) 100 MCG tablet Take 100 mcg by mouth daily.   Yes Historical Provider, MD   Physical Exam: Filed Vitals:   12/12/14 2130  BP: 138/57  Pulse: 93  Temp:   Resp: 22    BP 138/57 mmHg  Pulse 93  Temp(Src) 98.9 F (37.2 C) (Oral)  Resp 22  SpO2 95%  General Appearance:  Alert, oriented, no distress, appears stated age  Head:    Normocephalic, atraumatic  Eyes:    PERRL, EOMI, sclera non-icteric        Nose:   Nares without drainage or epistaxis. Mucosa, turbinates normal  Throat:   Moist mucous membranes. Oropharynx without erythema or exudate.  Neck:   Supple. No carotid bruits.  No thyromegaly.  No lymphadenopathy.   Back:     No CVA tenderness, no spinal tenderness  Lungs:     Clear to auscultation bilaterally, without wheezes, rhonchi or rales  Chest wall:    No tenderness to palpitation  Heart:    Regular rate and rhythm without murmurs, gallops, rubs  Abdomen:     Soft, non-tender, nondistended, normal bowel sounds, no organomegaly  Genitalia:    deferred   Rectal:    deferred  Extremities:   No clubbing, cyanosis or edema.  Pulses:   2+ and symmetric all extremities  Skin:   Skin color, texture, turgor normal, no rashes or lesions  Lymph nodes:   Cervical, supraclavicular, and axillary nodes normal  Neurologic:   CNII-XII intact. Normal strength, sensation and reflexes      throughout    Labs on Admission:  Basic Metabolic Panel:  Recent Labs Lab 12/12/14 1740  NA 130*  K 4.4  CL 92*  CO2 25  GLUCOSE 115*  BUN 12  CREATININE 0.97  CALCIUM 8.8*   Liver Function Tests:  Recent Labs Lab 12/12/14 1740  AST 33  ALT 21  ALKPHOS 88  BILITOT 0.9  PROT 7.4  ALBUMIN 2.9*   No results for input(s): LIPASE, AMYLASE in the last 168 hours. No results for input(s): AMMONIA in the last 168 hours. CBC:  Recent Labs Lab 12/12/14 1740  WBC 20.3*  NEUTROABS 18.4*  HGB 11.7*  HCT 35.1*  MCV 86.7  PLT 493*   Cardiac Enzymes: No results for input(s): CKTOTAL, CKMB, CKMBINDEX, TROPONINI in the last 168 hours.  BNP (last 3 results) No results for input(s): PROBNP in the last 8760 hours. CBG:  Recent Labs Lab 12/12/14 1718  GLUCAP 113*    Radiological Exams on Admission: Dg Chest 2 View  12/12/2014   CLINICAL DATA:  Fever and hypertension.  EXAM: CHEST  2 VIEW  COMPARISON:  10/30/2014  FINDINGS: Lungs are adequately inflated without consolidation or effusion. There is mild stable cardiomegaly. Minimal calcified plaque over the aortic arch. Minimal degenerative change of the spine.  IMPRESSION: No active cardiopulmonary disease.   Electronically Signed   By: Marin Olp M.D.   On: 12/12/2014 18:36    EKG: Independently reviewed.  Assessment/Plan Principal Problem:   Sepsis secondary to UTI Active Problems:   Essential hypertension   1. Sepsis secondary to UTI - 1. UCx pending 2. Tylenol PRN fever 3. Tele monitor for tachycardia (now resolved tachycardia) 4. Repeat CBC in am to trend WBC 5. IVF at 75  cc/hr 6. zofran PRN nausea 2. HTN - continue home lisinopril 3. Recent back surgery - continue norco for pain, if unable to tolerate this PO then will repeat fentanyl that helped in ED.    Code Status: Full  Family Communication: Daughter at bedside Disposition Plan: Admit to inpatient   Time spent: 11 min  GARDNER, JARED M. [facility] Pager [phone]  If 7AM-7PM, please contact the day team taking care of the patient [email or web address] Password [email or web address] 12/12/2014, 9:45 PM

## 2014-12-12 NOTE — Progress Notes (Signed)
New Admission Note:   Arrival Method: via stretcher from ED Mental Orientation: Alert and oriented x4 Telemetry: Placed on box 6E21 Assessment: Completed Skin: Intact, warm, dry. Surgical incision lower medial back well healed, no drainage or erythema present IV: Left AC with Normal Saline infusing at 75 mL/hr Pain: Denies 0/10 Tubes: N/A Safety Measures: Educated on fall prevention safety plan, patient acknowledged and understood. Admission: Completed [address] [place] Orientation: Patient has been orientated to the room, unit and staff.  Family: Daughter at bedside  Orders have been reviewed and implemented. Will continue to monitor the patient. Call light has been placed within reach and bed alarm has been activated.    Dorothea Glassman, RN  Phone number: [phone]

## 2014-12-12 NOTE — ED Provider Notes (Signed)
CSN: [id number]     Arrival date & time 12/12/14  1705 History   First MD Initiated Contact with Patient 12/12/14 1712     Chief Complaint  Patient presents with  . Fatigue  . Fever     (Consider location/radiation/quality/duration/timing/severity/associated sxs/prior Treatment) HPI Comments: Patient is an 81 year old female with a past medical history of hypertension, GERD and lumbar spine fusion 6 weeks ago who presents with a fever that started last night. Tmax 102.7 at home. Patient's daughter is at the bedside who contributes to the history. Patient reports gradual onset of fever and progressive worsening. Patient reports associated nausea and vomiting and mild to moderate headache. Patient reports 3 episodes of vomiting stomach contents and 1 episode of yellow vomit. No abdominal pain, chest pain, diarrhea, cough. Patient reports her only pain is in her lumbar spine which is baseline for her. Patient took Vicodin this morning with some relief. No aggravating factors. No known sick contacts.    Past Medical History  Diagnosis Date  . Hypertension   . Neuromuscular disorder     numbness and tingling legs and feet  . Arthritis   . GERD (gastroesophageal reflux disease)   . Frequent urination     also during the night  . Depression   . Cancer     basal cell skin cancer   Past Surgical History  Procedure Laterality Date  . Spine surgery    . Carpal tunnel release    . Back surgery    . Arthroscopic knee Right   . Removal skin cancer      basal cell   Family History  Problem Relation Age of Onset  . Arthritis Mother   . Cancer Father    History  Substance Use Topics  . Smoking status: Never Smoker   . Smokeless tobacco: Never Used  . Alcohol Use: No   OB History    No data available     Review of Systems  Constitutional: Positive for fever.  Gastrointestinal: Positive for vomiting.  All other systems reviewed and are negative.     Allergies  Review of  patient's allergies indicates no known allergies.  Home Medications   Prior to Admission medications   Medication Sig Start Date End Date Taking? Authorizing Provider  acetaminophen (TYLENOL) 500 MG tablet Take 1,000 mg by mouth every 6 (six) hours as needed.    Historical Provider, MD  allopurinol (ZYLOPRIM) 300 MG tablet Take 300 mg by mouth daily.  08/04/11   Historical Provider, MD  fish oil-omega-3 fatty acids 1000 MG capsule Take 2 g by mouth daily.    Historical Provider, MD  glucosamine-chondroitin 500-400 MG tablet Take 1 tablet by mouth 3 (three) times daily.    Historical Provider, MD  HYDROcodone-acetaminophen (NORCO/VICODIN) 5-325 MG per tablet Take 1 tablet by mouth every 6 (six) hours as needed. for pain 10/07/14   Historical Provider, MD  HYDROcodone-acetaminophen (NORCO/VICODIN) 5-325 MG per tablet Take 1-2 tablets by mouth every 4 (four) hours as needed for moderate pain. 11/02/14   Newman Pies, MD  ketoconazole (NIZORAL) 2 % cream Apply 1 application topically as needed for irritation.    Historical Provider, MD  lisinopril (PRINIVIL,ZESTRIL) 30 MG tablet Take 30 mg by mouth daily.    Historical Provider, MD  Multiple Vitamin (MULTIVITAMIN) tablet Take 1 tablet by mouth daily.    Historical Provider, MD  omeprazole (PRILOSEC OTC) 20 MG tablet Take 20 mg by mouth as needed (heartburn).  Historical Provider, MD  OVER THE COUNTER MEDICATION Take 1 tablet by mouth daily as needed (immune systetm boost). OPC-3    Historical Provider, MD  vitamin B-12 (CYANOCOBALAMIN) 100 MCG tablet Take 100 mcg by mouth daily.    Historical Provider, MD   BP 145/65 mmHg  Temp(Src) 102.4 F (39.1 C) (Rectal)  Resp 23  SpO2 98% Physical Exam  Constitutional: She appears well-developed and well-nourished. No distress.  HENT:  Head: Normocephalic and atraumatic.  Eyes: Conjunctivae and EOM are normal.  Neck: Normal range of motion.  Cardiovascular: Normal rate and regular rhythm.  Exam  reveals no gallop and no friction rub.   No murmur heard. Pulmonary/Chest: Effort normal and breath sounds normal. She has no wheezes. She has no rales. She exhibits no tenderness.  Abdominal: Soft. She exhibits no distension. There is no tenderness. There is no rebound.  Musculoskeletal: Normal range of motion.  Neurological: She is alert.  Speech is goal-oriented. Moves limbs without ataxia.   Skin: Skin is warm and dry.  Lumbar surgical site well healed without signs of erythema, drainage, or wound dehiscence.   Psychiatric: She has a normal mood and affect. Her behavior is normal.  Nursing note and vitals reviewed.   ED Course  Procedures (including critical care time)  CRITICAL CARE Performed by: Alvina Chou   Total critical care time: 35 min  Critical care time was exclusive of separately billable procedures and treating other patients.  Critical care was necessary to treat or prevent imminent or life-threatening deterioration.  Critical care was time spent personally by me on the following activities: development of treatment plan with patient and/or surrogate as well as nursing, discussions with consultants, evaluation of patient's response to treatment, examination of patient, obtaining history from patient or surrogate, ordering and performing treatments and interventions, ordering and review of laboratory studies, ordering and review of radiographic studies, pulse oximetry and re-evaluation of patient's condition.   Labs Review Labs Reviewed  CBC WITH DIFFERENTIAL/PLATELET - Abnormal; Notable for the following:    WBC 20.3 (*)    Hemoglobin 11.7 (*)    HCT 35.1 (*)    Platelets 493 (*)    Neutrophils Relative % 91 (*)    Neutro Abs 18.4 (*)    Lymphocytes Relative 5 (*)    All other components within normal limits  COMPREHENSIVE METABOLIC PANEL - Abnormal; Notable for the following:    Sodium 130 (*)    Chloride 92 (*)    Glucose, Bld 115 (*)    Calcium  8.8 (*)    Albumin 2.9 (*)    GFR calc non Af Amer 53 (*)    All other components within normal limits  URINALYSIS, ROUTINE W REFLEX MICROSCOPIC (NOT AT [facility]) - Abnormal; Notable for the following:    APPearance CLOUDY (*)    Hgb urine dipstick SMALL (*)    Leukocytes, UA MODERATE (*)    All other components within normal limits  URINE MICROSCOPIC-ADD ON - Abnormal; Notable for the following:    Squamous Epithelial / LPF FEW (*)    Bacteria, UA FEW (*)    All other components within normal limits  CBG MONITORING, ED - Abnormal; Notable for the following:    Glucose-Capillary 113 (*)    All other components within normal limits  CULTURE, BLOOD (ROUTINE X 2)  CULTURE, BLOOD (ROUTINE X 2)  URINE CULTURE  CBC  BASIC METABOLIC PANEL  I-STAT CG4 LACTIC ACID, ED  Randolm Idol, ED  I-STAT CG4 LACTIC ACID, ED    Imaging Review Dg Chest 2 View  12/12/2014   CLINICAL DATA:  Fever and hypertension.  EXAM: CHEST  2 VIEW  COMPARISON:  10/30/2014  FINDINGS: Lungs are adequately inflated without consolidation or effusion. There is mild stable cardiomegaly. Minimal calcified plaque over the aortic arch. Minimal degenerative change of the spine.  IMPRESSION: No active cardiopulmonary disease.   Electronically Signed   By: Marin Olp M.D.   On: 12/12/2014 18:36     EKG Interpretation   Date/Time:  Saturday December 12 2014 17:14:55 EDT Ventricular Rate:  92 PR Interval:  115 QRS Duration: 79 QT Interval:  343 QTC Calculation: 424 R Axis:   32 Text Interpretation:  Sinus rhythm Atrial premature complex Aberrant  conduction of SV complex(es) Borderline short PR interval Nonspecific T  abnormalities, lateral leads Since last tracing Premature atrial complexes  now present Confirmed by MILLER  MD, BRIAN ([id number]) on 12/12/2014 5:33:17 PM      MDM   Final diagnoses:  Fever    6:17 PM Labs show elevated WBC at 20. Patient temp now 102.4. Tachycardic at this time. Patient will have IV  fluids. Chest xray and urinalysis pending.   Patient started on 2g Rocephin. Patient will be admitted to Dr. Alcario Drought.     Alvina Chou, PA-C 12/13/14 0038  Noemi Chapel, MD 12/13/14 [place], MD 12/13/14 [phone]

## 2014-12-13 DIAGNOSIS — N390 Urinary tract infection, site not specified: Secondary | ICD-10-CM

## 2014-12-13 DIAGNOSIS — D649 Anemia, unspecified: Secondary | ICD-10-CM

## 2014-12-13 DIAGNOSIS — A419 Sepsis, unspecified organism: Principal | ICD-10-CM

## 2014-12-13 DIAGNOSIS — I10 Essential (primary) hypertension: Secondary | ICD-10-CM

## 2014-12-13 LAB — BASIC METABOLIC PANEL
Anion gap: 7 (ref 5–15)
BUN: 13 mg/dL (ref 6–20)
CALCIUM: 8.4 mg/dL — AB (ref 8.9–10.3)
CO2: 26 mmol/L (ref 22–32)
Chloride: 100 mmol/L — ABNORMAL LOW (ref 101–111)
Creatinine, Ser: 1.08 mg/dL — ABNORMAL HIGH (ref 0.44–1.00)
GFR, EST AFRICAN AMERICAN: 54 mL/min — AB (ref >60)
GFR, EST NON AFRICAN AMERICAN: 46 mL/min — AB (ref >60)
GLUCOSE: 109 mg/dL — AB (ref 65–99)
POTASSIUM: 4.2 mmol/L (ref 3.5–5.1)
SODIUM: 133 mmol/L — AB (ref 135–145)

## 2014-12-13 LAB — CBC
HEMATOCRIT: 31.9 % — AB (ref 36.0–46.0)
Hemoglobin: 10.3 g/dL — ABNORMAL LOW (ref 12.0–15.0)
MCH: 28.3 pg (ref 26.0–34.0)
MCHC: 32.3 g/dL (ref 30.0–36.0)
MCV: 87.6 fL (ref 78.0–100.0)
Platelets: 381 10*3/uL (ref 150–400)
RBC: 3.64 MIL/uL — AB (ref 3.87–5.11)
RDW: 14.2 % (ref 11.5–15.5)
WBC: 10.5 10*3/uL (ref 4.0–10.5)

## 2014-12-13 NOTE — Progress Notes (Signed)
[facility]                                                                              Patient Demographics  Brenda [name], is a 81 y.o. female, DOB - 1931-01-24, YQM:[id number]  Admit date - 12/12/2014   Admitting Physician Etta Quill, DO  Outpatient Primary MD for the patient is ARONSON,RICHARD A, MD  LOS - 1   Chief Complaint  Patient presents with  . Fatigue  . Fever      HPI on 12/12/2014 by Dr. Jennette Kettle Brenda [name] KERIANNA [name] is a 81 y.o. female who presents to the ED with c/o fever and vomiting for the past 24 hours or so. Symptoms onset last night. Numerous episodes of vomiting. Associated mild to moderate headache. Tm 102.7 at home (102.4 here in ED). No abd pain, chest pain, diarrhea, cough.  Assessment & Plan   Sepsis secondary to UTI -Upon admission, patient was tachycardic with leukocytosis and was febrile -Leukocytosis resolved, patient afebrile -UA: WBC TNTC, moderate leukocytes, few bacteria -Urine culture pending -Continue ceftriaxone  Nausea -Likely secondary to urinary tract infection -Continue antiemetics  Hypertension -Stable, continue lisinopril  Recent back surgery -Continue pain control  Normocytic anemia -Hemoglobin currently 10.3, likely dilutional component as patient is receiving IV fluids -Continue to monitor CBC  Code Status: Full  Family Communication: Daughter at bedside  Disposition Plan: Admitted  Time Spent in minutes   30 minutes  Procedures  None  Consults   None  DVT Prophylaxis  Lovenox  Lab Results  Component Value Date   PLT 381 12/13/2014    Medications  Scheduled Meds: . allopurinol  300 mg Oral Daily  . cefTRIAXone (ROCEPHIN)  IV  1 g Intravenous Q24H  . enoxaparin (LOVENOX) injection  40 mg Subcutaneous Q24H  . lisinopril  20 mg Oral Daily  . senna-docusate  1 tablet Oral Daily  . sodium chloride  3 mL Intravenous Q12H   Continuous Infusions: . sodium chloride 75 mL/hr at 12/13/14  0600   PRN Meds:.acetaminophen, HYDROcodone-acetaminophen, ondansetron (ZOFRAN) IV, pantoprazole, polyethylene glycol  Antibiotics    Anti-infectives    Start     Dose/Rate Route Frequency Ordered Stop   12/13/14 2015  cefTRIAXone (ROCEPHIN) 1 g in dextrose 5 % 50 mL IVPB     1 g 100 mL/hr over 30 Minutes Intravenous Every 24 hours 12/12/14 2130     12/12/14 2130  cefTRIAXone (ROCEPHIN) 2 g in dextrose 5 % 50 mL IVPB     2 g 100 mL/hr over 30 Minutes Intravenous  Once 12/12/14 2123 12/13/14 0700      Subjective:   Horton Chin seen and examined today.  Patient states she is feeling much better today. Denies any chest pain or shortness of breath. States her nausea has improved.  Objective:   Filed Vitals:   12/12/14 2107 12/12/14 2130 12/12/14 2246 12/13/14 0600  BP:  138/57 125/55 128/80  Pulse:  93 91 94  Temp: 98.9 F (37.2 C)  98.2 F (36.8 C) 98.5 F (36.9 C)  TempSrc: Oral  Oral Oral  Resp:  22 20 18   Height:   5\' 3"  (1.6 m)  Weight:   73.982 kg (163 lb 1.6 oz)   SpO2:  95% 96% 100%    Wt Readings from Last 3 Encounters:  12/12/14 73.982 kg (163 lb 1.6 oz)  11/17/14 76.839 kg (169 lb 6.4 oz)  11/04/14 75.932 kg (167 lb 6.4 oz)     Intake/Output Summary (Last 24 hours) at 12/13/14 1249 Last data filed at 12/13/14 0900  Gross per 24 hour  Intake 991.25 ml  Output    250 ml  Net 741.25 ml    Exam  General: Well developed, well nourished, no distress  HEENT: NCAT, mucous membranes slightly dry  Cardiovascular: S1 S2 auscultated, no rubs, murmurs or gallops. Regular rate and rhythm.  Respiratory: Clear to auscultation bilaterally with equal chest rise  Abdomen: Soft, nontender, nondistended, + bowel sounds  Extremities: warm dry without cyanosis clubbing or edema  Neuro: AAOx3, nonfocal  Data Review   Micro Results Recent Results (from the past 240 hour(s))  Urine culture     Status: None (Preliminary result)   Collection Time: 12/12/14   7:40 PM  Result Value Ref Range Status   Specimen Description URINE, CLEAN CATCH  Final   Special Requests ADDED 2202  Final   Culture TOO YOUNG TO READ  Final   Report Status PENDING  Incomplete    Radiology Reports Dg Chest 2 View  12/12/2014   CLINICAL DATA:  Fever and hypertension.  EXAM: CHEST  2 VIEW  COMPARISON:  10/30/2014  FINDINGS: Lungs are adequately inflated without consolidation or effusion. There is mild stable cardiomegaly. Minimal calcified plaque over the aortic arch. Minimal degenerative change of the spine.  IMPRESSION: No active cardiopulmonary disease.   Electronically Signed   By: Marin Olp M.D.   On: 12/12/2014 18:36    CBC  Recent Labs Lab 12/12/14 1740 12/13/14 0510  WBC 20.3* 10.5  HGB 11.7* 10.3*  HCT 35.1* 31.9*  PLT 493* 381  MCV 86.7 87.6  MCH 28.9 28.3  MCHC 33.3 32.3  RDW 14.1 14.2  LYMPHSABS 1.0  --   MONOABS 0.8  --   EOSABS 0.0  --   BASOSABS 0.0  --     Chemistries   Recent Labs Lab 12/12/14 1740 12/13/14 0510  NA 130* 133*  K 4.4 4.2  CL 92* 100*  CO2 25 26  GLUCOSE 115* 109*  BUN 12 13  CREATININE 0.97 1.08*  CALCIUM 8.8* 8.4*  AST 33  --   ALT 21  --   ALKPHOS 88  --   BILITOT 0.9  --    ------------------------------------------------------------------------------------------------------------------ estimated creatinine clearance is 38.7 mL/min (by C-G formula based on Cr of 1.08). ------------------------------------------------------------------------------------------------------------------ No results for input(s): HGBA1C in the last 72 hours. ------------------------------------------------------------------------------------------------------------------ No results for input(s): CHOL, HDL, LDLCALC, TRIG, CHOLHDL, LDLDIRECT in the last 72 hours. ------------------------------------------------------------------------------------------------------------------ No results for input(s): TSH, T4TOTAL, T3FREE,  THYROIDAB in the last 72 hours.  Invalid input(s): FREET3 ------------------------------------------------------------------------------------------------------------------ No results for input(s): VITAMINB12, FOLATE, FERRITIN, TIBC, IRON, RETICCTPCT in the last 72 hours.  Coagulation profile No results for input(s): INR, PROTIME in the last 168 hours.  No results for input(s): DDIMER in the last 72 hours.  Cardiac Enzymes No results for input(s): CKMB, TROPONINI, MYOGLOBIN in the last 168 hours.  Invalid input(s): CK ------------------------------------------------------------------------------------------------------------------ Invalid input(s): POCBNP    Joyice Magda D.O. on 12/13/2014 at 12:49 PM  Between 7am to 7pm - Pager - [phone]  After 7pm go to [email or web address] - password [email or web address]  And look for  the night coverage person covering for me after hours  [facility] Office  [phone]

## 2014-12-13 NOTE — Discharge Instructions (Signed)

## 2014-12-13 NOTE — Discharge Summary (Signed)
Physician Discharge Summary  Brenda [name] WLN:[id number] DOB: [birth year] DOA: 12/12/2014  PCP: Geoffery Lyons, MD  Admit date: 12/12/2014 Discharge date: 12/14/2014  Time spent: 45 minutes  Recommendations for Outpatient Follow-up:  Patient will be discharged to home.  Patient will need to follow up with primary care provider within one week of discharge, and have repeat CBC.  Patient should continue medications as prescribed.  Patient should follow a heart healthy diet.   Discharge Diagnoses:  Sepsis secondary to UTI Nausea Hypertension Recent back surgery Normocytic anemia  Discharge Condition: Stable  Diet recommendation: Heart healthy  Filed Weights   12/12/14 2246 12/13/14 2014  Weight: 73.982 kg (163 lb 1.6 oz) 75.025 kg (165 lb 6.4 oz)    History of present illness:  on 12/12/2014 by Dr. Jennette Kettle Brenda [name] is a 81 y.o. female who presents to the ED with c/o fever and vomiting for the past 24 hours or so. Symptoms onset last night. Numerous episodes of vomiting. Associated mild to moderate headache. Tm 102.7 at home (102.4 here in ED). No abd pain, chest pain, diarrhea, cough.  Hospital Course:  Sepsis secondary to UTI -Upon admission, patient was tachycardic with leukocytosis and was febrile -Leukocytosis resolved, patient afebrile -UA: WBC TNTC, moderate leukocytes, few bacteria -Urine culture pending- preliminary report shows mixed, no predominating organisms -Was placed ceftriaxone, will discharge patient with Ceftin 500mg  BID  Nausea -Resolved, Likely secondary to urinary tract infection -Was placed on antiemetics PRN  Hypertension -Stable, continue lisinopril  Recent back surgery -Continue pain control  Normocytic anemia -Hemoglobin currently 9.9, likely dilutional component as patient is receiving IV fluids -Repeat CBC  Procedures  None  Consults  None  Discharge Exam: Filed Vitals:   12/14/14 0957  BP: 152/56  Pulse:  95  Temp: 98.2 F (36.8 C)  Resp: 18   Exam  General: Well developed, well nourished, no distress  HEENT: NCAT, mucous membranes moist  Cardiovascular: S1 S2 auscultated, RRR  Respiratory: Clear to auscultation  Abdomen: Soft, nontender, nondistended, + bowel sounds  Extremities: warm dry without cyanosis clubbing or edema  Neuro: AAOx3, nonfocal  Psych: Pleasant  Discharge Instructions      Discharge Instructions    Discharge instructions    Complete by:  As directed   Patient will be discharged to home.  Patient will need to follow up with primary care provider within one week of discharge, and have repeat CBC.  Patient should continue medications as prescribed.  Patient should follow a heart healthy diet.            Medication List    TAKE these medications        acetaminophen 500 MG tablet  Commonly known as:  TYLENOL  Take 1,000 mg by mouth every 6 (six) hours as needed for moderate pain.     allopurinol 300 MG tablet  Commonly known as:  ZYLOPRIM  Take 300 mg by mouth daily.     cefUROXime 500 MG tablet  Commonly known as:  CEFTIN  Take 1 tablet (500 mg total) by mouth 2 (two) times daily with a meal.     fish oil-omega-3 fatty acids 1000 MG capsule  Take 2 g by mouth daily.     glucosamine-chondroitin 500-400 MG tablet  Take 2 tablets by mouth 3 (three) times daily.     HYDROcodone-acetaminophen 5-325 MG per tablet  Commonly known as:  NORCO/VICODIN  Take 1-2 tablets by mouth every 4 (four) hours as needed for  moderate pain.     lisinopril 20 MG tablet  Commonly known as:  PRINIVIL,ZESTRIL  Take 20 mg by mouth daily.     multivitamin tablet  Take 1 tablet by mouth daily.     omeprazole 20 MG tablet  Commonly known as:  PRILOSEC OTC  Take 20 mg by mouth as needed (heartburn).     OVER THE COUNTER MEDICATION  Take 1 tablet by mouth daily as needed (immune systetm boost). OPC-3     polyethylene glycol packet  Commonly known as:  MIRALAX  / GLYCOLAX  Take 17 g by mouth daily as needed for moderate constipation.     PROTEIN PO  Take 1 Container by mouth daily.     sennosides-docusate sodium 8.6-50 MG tablet  Commonly known as:  SENOKOT-S  Take 1 tablet by mouth daily.     vitamin B-12 100 MCG tablet  Commonly known as:  CYANOCOBALAMIN  Take 100 mcg by mouth daily.       No Known Allergies Follow-up Information    Follow up with ARONSON,RICHARD A, MD. Schedule an appointment as soon as possible for a visit in 1 week.   Specialty:  Internal Medicine   Why:  Hospital follow-up   Contact information:   [address] [place] [address] [phone]        The results of significant diagnostics from this hospitalization (including imaging, microbiology, ancillary and laboratory) are listed below for reference.    Significant Diagnostic Studies: Dg Chest 2 View  12/12/2014   CLINICAL DATA:  Fever and hypertension.  EXAM: CHEST  2 VIEW  COMPARISON:  10/30/2014  FINDINGS: Lungs are adequately inflated without consolidation or effusion. There is mild stable cardiomegaly. Minimal calcified plaque over the aortic arch. Minimal degenerative change of the spine.  IMPRESSION: No active cardiopulmonary disease.   Electronically Signed   By: Marin Olp M.D.   On: 12/12/2014 18:36    Microbiology: Recent Results (from the past 240 hour(s))  Blood culture (routine x 2)     Status: None (Preliminary result)   Collection Time: 12/12/14  6:45 PM  Result Value Ref Range Status   Specimen Description BLOOD RIGHT ARM  Final   Special Requests BOTTLES DRAWN AEROBIC AND ANAEROBIC 5CC  Final   Culture NO GROWTH < 24 HOURS  Final   Report Status PENDING  Incomplete  Blood culture (routine x 2)     Status: None (Preliminary result)   Collection Time: 12/12/14  6:50 PM  Result Value Ref Range Status   Specimen Description BLOOD RIGHT WRIST  Final   Special Requests BOTTLES DRAWN AEROBIC AND ANAEROBIC 5CC  Final   Culture NO  GROWTH < 24 HOURS  Final   Report Status PENDING  Incomplete  Urine culture     Status: None   Collection Time: 12/12/14  7:40 PM  Result Value Ref Range Status   Specimen Description URINE, CLEAN CATCH  Final   Special Requests ADDED 2202  Final   Culture MULTIPLE SPECIES PRESENT, SUGGEST RECOLLECTION  Final   Report Status 12/14/2014 FINAL  Final     Labs: Basic Metabolic Panel:  Recent Labs Lab 12/12/14 1740 12/13/14 0510 12/14/14 0517  NA 130* 133* 134*  K 4.4 4.2 4.0  CL 92* 100* 104  CO2 25 26 24   GLUCOSE 115* 109* 105*  BUN 12 13 10   CREATININE 0.97 1.08* 0.84  CALCIUM 8.8* 8.4* 8.3*   Liver Function Tests:  Recent Labs Lab 12/12/14  1740  AST 33  ALT 21  ALKPHOS 88  BILITOT 0.9  PROT 7.4  ALBUMIN 2.9*   No results for input(s): LIPASE, AMYLASE in the last 168 hours. No results for input(s): AMMONIA in the last 168 hours. CBC:  Recent Labs Lab 12/12/14 1740 12/13/14 0510 12/14/14 0517  WBC 20.3* 10.5 9.3  NEUTROABS 18.4*  --   --   HGB 11.7* 10.3* 9.9*  HCT 35.1* 31.9* 30.5*  MCV 86.7 87.6 87.4  PLT 493* 381 401*   Cardiac Enzymes: No results for input(s): CKTOTAL, CKMB, CKMBINDEX, TROPONINI in the last 168 hours. BNP: BNP (last 3 results) No results for input(s): BNP in the last 8760 hours.  ProBNP (last 3 results) No results for input(s): PROBNP in the last 8760 hours.  CBG:  Recent Labs Lab 12/12/14 1718  GLUCAP 113*       Signed:  Cristal Ford  [facility] 12/14/2014, 10:56 AM

## 2014-12-14 DIAGNOSIS — R112 Nausea with vomiting, unspecified: Secondary | ICD-10-CM

## 2014-12-14 LAB — CBC
HEMATOCRIT: 30.5 % — AB (ref 36.0–46.0)
Hemoglobin: 9.9 g/dL — ABNORMAL LOW (ref 12.0–15.0)
MCH: 28.4 pg (ref 26.0–34.0)
MCHC: 32.5 g/dL (ref 30.0–36.0)
MCV: 87.4 fL (ref 78.0–100.0)
Platelets: 401 10*3/uL — ABNORMAL HIGH (ref 150–400)
RBC: 3.49 MIL/uL — AB (ref 3.87–5.11)
RDW: 14.2 % (ref 11.5–15.5)
WBC: 9.3 10*3/uL (ref 4.0–10.5)

## 2014-12-14 LAB — BASIC METABOLIC PANEL
ANION GAP: 6 (ref 5–15)
BUN: 10 mg/dL (ref 6–20)
CO2: 24 mmol/L (ref 22–32)
CREATININE: 0.84 mg/dL (ref 0.44–1.00)
Calcium: 8.3 mg/dL — ABNORMAL LOW (ref 8.9–10.3)
Chloride: 104 mmol/L (ref 101–111)
GFR calc Af Amer: >60 mL/min (ref >60)
GFR calc non Af Amer: >60 mL/min (ref >60)
Glucose, Bld: 105 mg/dL — ABNORMAL HIGH (ref 65–99)
POTASSIUM: 4 mmol/L (ref 3.5–5.1)
Sodium: 134 mmol/L — ABNORMAL LOW (ref 135–145)

## 2014-12-14 LAB — URINE CULTURE

## 2014-12-14 MED ORDER — CEFUROXIME AXETIL 500 MG PO TABS: 500.0000 mg | ORAL_TABLET | Freq: Two times a day (BID) | ORAL | Status: DC

## 2014-12-14 NOTE — Care Management Important Message (Signed)
Important Message  Patient Details  Name: Brenda [name] MRN: [id number] Date of Birth: 20-Oct-1930   Medicare Important Message Given:  Yes-second notification given    Delorse Lek 12/14/2014, 12:08 PM

## 2014-12-14 NOTE — Care Management Note (Signed)
Case Management Note  Patient Details  Name: [name] MRN: [id number] Date of Birth: 07-Oct-1930  Subjective/Objective:        CM following for progression and d/c planning.            Action/Plan: Per pt she is active with [facility] for [facility] services, for HHPT, HHOT and [place] aide and wishes to continue [facility] services with [place]. This CM confirmed pt active with [place] and notified that agency of orders to resume services.   Expected Discharge Date:       12/14/2014           Expected Discharge Plan:  [place]  In-House Referral:  NA  Discharge planning Services  CM Consult  Post Acute Care Choice:  Home Health Choice offered to:  Patient  DME Arranged:    DME Agency:     HH Arranged:  PT, OT, Nurse's Aide [place] Agency:  [facility]  Status of Service:  Completed, signed off  Medicare Important Message Given:  Yes-second notification given Date Medicare IM Given:    Medicare IM give by:    Date Additional Medicare IM Given:    Additional Medicare Important Message give by:     If discussed at [place] of Stay Meetings, dates discussed:    Additional Comments:  Adron Bene, RN 12/14/2014, 1:17 PM

## 2014-12-17 LAB — CULTURE, BLOOD (ROUTINE X 2)
Culture: NO GROWTH
Culture: NO GROWTH

## 2015-12-01 ENCOUNTER — Other Ambulatory Visit: Payer: Self-pay | Admitting: Urology

## 2015-12-22 ENCOUNTER — Encounter (HOSPITAL_BASED_OUTPATIENT_CLINIC_OR_DEPARTMENT_OTHER): Payer: Self-pay | Admitting: *Deleted

## 2015-12-24 ENCOUNTER — Encounter (HOSPITAL_BASED_OUTPATIENT_CLINIC_OR_DEPARTMENT_OTHER): Payer: Self-pay | Admitting: *Deleted

## 2015-12-24 NOTE — Progress Notes (Signed)
NPO AFTER MN.  ARRIVE AT 0700.  NEEDS ISTAT 8 AND EKG.  WILL TAKE TYLENOL, COLON HEALTH, AND ZANTAC AM DOS W/ SIPS OF WATER.

## 2015-12-29 ENCOUNTER — Ambulatory Visit (HOSPITAL_BASED_OUTPATIENT_CLINIC_OR_DEPARTMENT_OTHER): Payer: Medicare Other | Admitting: Anesthesiology

## 2015-12-29 ENCOUNTER — Observation Stay (HOSPITAL_BASED_OUTPATIENT_CLINIC_OR_DEPARTMENT_OTHER)
Admission: RE | Admit: 2015-12-29 | Discharge: 2015-12-30 | Disposition: A | Discharge status: Home / Self Care | Payer: Medicare Other | Source: Ambulatory Visit | Attending: Urology | Admitting: Urology

## 2015-12-29 ENCOUNTER — Encounter (HOSPITAL_COMMUNITY)
Admission: RE | Disposition: A | Discharge status: Home / Self Care | Payer: Self-pay | Source: Ambulatory Visit | Attending: Urology

## 2015-12-29 ENCOUNTER — Encounter (HOSPITAL_BASED_OUTPATIENT_CLINIC_OR_DEPARTMENT_OTHER): Payer: Self-pay | Admitting: *Deleted

## 2015-12-29 DIAGNOSIS — K219 Gastro-esophageal reflux disease without esophagitis: Secondary | ICD-10-CM | POA: Insufficient documentation

## 2015-12-29 DIAGNOSIS — N3020 Other chronic cystitis without hematuria: Secondary | ICD-10-CM | POA: Insufficient documentation

## 2015-12-29 DIAGNOSIS — N3289 Other specified disorders of bladder: Secondary | ICD-10-CM | POA: Diagnosis not present

## 2015-12-29 DIAGNOSIS — M1990 Unspecified osteoarthritis, unspecified site: Secondary | ICD-10-CM | POA: Insufficient documentation

## 2015-12-29 DIAGNOSIS — R310 Gross hematuria: Secondary | ICD-10-CM | POA: Diagnosis present

## 2015-12-29 DIAGNOSIS — I10 Essential (primary) hypertension: Secondary | ICD-10-CM | POA: Diagnosis not present

## 2015-12-29 DIAGNOSIS — N1330 Unspecified hydronephrosis: Secondary | ICD-10-CM | POA: Diagnosis present

## 2015-12-29 HISTORY — PX: CYSTOSCOPY W/ RETROGRADES: SHX1426

## 2015-12-29 HISTORY — DX: Presence of spectacles and contact lenses: Z97.3

## 2015-12-29 HISTORY — DX: Unsteadiness on feet: R26.81

## 2015-12-29 HISTORY — DX: Anesthesia of skin: R20.0

## 2015-12-29 HISTORY — DX: Hyperlipidemia, unspecified: E78.5

## 2015-12-29 HISTORY — DX: Personal history of urinary (tract) infections: Z87.440

## 2015-12-29 HISTORY — DX: Personal history of other malignant neoplasm of skin: Z98.890

## 2015-12-29 HISTORY — DX: Other specified disorders of bladder: N32.89

## 2015-12-29 HISTORY — DX: Paresthesia of skin: R20.2

## 2015-12-29 HISTORY — DX: Personal history of other diseases of the musculoskeletal system and connective tissue: Z87.39

## 2015-12-29 HISTORY — DX: Unspecified hydronephrosis: N13.30

## 2015-12-29 HISTORY — PX: CYSTOSCOPY WITH BIOPSY: SHX5122

## 2015-12-29 HISTORY — DX: Urge incontinence: N39.41

## 2015-12-29 HISTORY — DX: Unspecified osteoarthritis, unspecified site: M19.90

## 2015-12-29 HISTORY — DX: Other specified postprocedural states: Z85.828

## 2015-12-29 HISTORY — PX: CYSTOSCOPY WITH FULGERATION: SHX6638

## 2015-12-29 HISTORY — DX: Nocturia: R35.1

## 2015-12-29 SURGERY — CYSTOSCOPY, WITH BIOPSY
Anesthesia: General | Site: Renal | Laterality: Right

## 2015-12-29 MED ORDER — BELLADONNA ALKALOIDS-OPIUM 16.2-60 MG RE SUPP
RECTAL | Status: AC
  Filled 2015-12-29: qty 1

## 2015-12-29 MED ORDER — SODIUM CHLORIDE 0.9 % IR SOLN
Status: DC | PRN
  Administered 2015-12-29: 9000 mL

## 2015-12-29 MED ORDER — LISINOPRIL 20 MG PO TABS
20.0000 mg | ORAL_TABLET | Freq: Every day | ORAL | Status: DC
  Administered 2015-12-29 – 2015-12-30 (×2): 20 mg via ORAL
  Filled 2015-12-29 (×3): qty 1

## 2015-12-29 MED ORDER — KCL IN DEXTROSE-NACL 20-5-0.45 MEQ/L-%-% IV SOLN
INTRAVENOUS | Status: DC
  Administered 2015-12-29: 12:00:00 via INTRAVENOUS
  Filled 2015-12-29 (×2): qty 1000

## 2015-12-29 MED ORDER — LIDOCAINE HCL (CARDIAC) 20 MG/ML IV SOLN
INTRAVENOUS | Status: DC | PRN
  Administered 2015-12-29: 60 mg via INTRAVENOUS

## 2015-12-29 MED ORDER — DEXAMETHASONE SODIUM PHOSPHATE 4 MG/ML IJ SOLN
INTRAMUSCULAR | Status: DC | PRN
  Administered 2015-12-29: 10 mg via INTRAVENOUS

## 2015-12-29 MED ORDER — DEXAMETHASONE SODIUM PHOSPHATE 10 MG/ML IJ SOLN
INTRAMUSCULAR | Status: AC
  Filled 2015-12-29: qty 1

## 2015-12-29 MED ORDER — STERILE WATER FOR IRRIGATION IR SOLN
Status: DC | PRN
  Administered 2015-12-29: 3000 mL

## 2015-12-29 MED ORDER — PROPOFOL 10 MG/ML IV BOLUS
INTRAVENOUS | Status: DC | PRN
  Administered 2015-12-29: 100 mg via INTRAVENOUS
  Administered 2015-12-29: 60 mg via INTRAVENOUS
  Administered 2015-12-29: 20 mg via INTRAVENOUS
  Administered 2015-12-29: 30 mg via INTRAVENOUS

## 2015-12-29 MED ORDER — TRAMADOL HCL 50 MG PO TABS
50.0000 mg | ORAL_TABLET | Freq: Four times a day (QID) | ORAL | Status: DC | PRN
  Administered 2015-12-29 (×2): 50 mg via ORAL
  Filled 2015-12-29 (×2): qty 1

## 2015-12-29 MED ORDER — SENNOSIDES-DOCUSATE SODIUM 8.6-50 MG PO TABS
1.0000 | ORAL_TABLET | Freq: Two times a day (BID) | ORAL | Status: DC
  Administered 2015-12-29 – 2015-12-30 (×3): 1 via ORAL
  Filled 2015-12-29 (×3): qty 1

## 2015-12-29 MED ORDER — ONDANSETRON HCL 4 MG/2ML IJ SOLN
INTRAMUSCULAR | Status: AC
  Filled 2015-12-29: qty 2

## 2015-12-29 MED ORDER — GENTAMICIN IN SALINE 1.6-0.9 MG/ML-% IV SOLN
80.0000 mg | INTRAVENOUS | Status: DC
  Filled 2015-12-29: qty 50

## 2015-12-29 MED ORDER — HYDROMORPHONE HCL 1 MG/ML IJ SOLN: 0.5000 mg | INTRAMUSCULAR | Status: DC | PRN

## 2015-12-29 MED ORDER — LIDOCAINE HCL (CARDIAC) 20 MG/ML IV SOLN
INTRAVENOUS | Status: AC
  Filled 2015-12-29: qty 5

## 2015-12-29 MED ORDER — ONDANSETRON HCL 4 MG/2ML IJ SOLN
INTRAMUSCULAR | Status: DC | PRN
  Administered 2015-12-29: 4 mg via INTRAVENOUS

## 2015-12-29 MED ORDER — IOHEXOL 300 MG/ML  SOLN
INTRAMUSCULAR | Status: DC | PRN
  Administered 2015-12-29: 40 mL

## 2015-12-29 MED ORDER — GENTAMICIN SULFATE 40 MG/ML IJ SOLN
5.0000 mg/kg | Freq: Once | INTRAVENOUS | Status: AC
  Administered 2015-12-29 (×2): 320 mg via INTRAVENOUS
  Filled 2015-12-29 (×2): qty 8

## 2015-12-29 MED ORDER — HYDROMORPHONE HCL 1 MG/ML IJ SOLN
0.2500 mg | INTRAMUSCULAR | Status: DC | PRN
  Filled 2015-12-29: qty 1

## 2015-12-29 MED ORDER — FENTANYL CITRATE (PF) 100 MCG/2ML IJ SOLN
INTRAMUSCULAR | Status: DC | PRN
Start: 2015-12-29 — End: 2015-12-29
  Administered 2015-12-29: 25 ug via INTRAVENOUS
  Administered 2015-12-29: 50 ug via INTRAVENOUS
  Administered 2015-12-29: 25 ug via INTRAVENOUS

## 2015-12-29 MED ORDER — LACTATED RINGERS IV SOLN
INTRAVENOUS | Status: DC
  Administered 2015-12-29 (×2): via INTRAVENOUS
  Filled 2015-12-29: qty 1000

## 2015-12-29 MED ORDER — FENTANYL CITRATE (PF) 100 MCG/2ML IJ SOLN
INTRAMUSCULAR | Status: AC
  Filled 2015-12-29: qty 2

## 2015-12-29 MED ORDER — PROMETHAZINE HCL 25 MG/ML IJ SOLN
6.2500 mg | INTRAMUSCULAR | Status: DC | PRN
Start: 2015-12-29 — End: 2015-12-29
  Filled 2015-12-29: qty 1

## 2015-12-29 SURGICAL SUPPLY — 44 items
BAG DRAIN URO-CYSTO SKYTR STRL (DRAIN) ×6 IMPLANT
BAG DRN ANRFLXCHMBR STRAP LEK (BAG) ×4
BAG DRN UROCATH (DRAIN) ×4
BAG INFUSOR PRESSURE 1000ML (BAG) ×3 IMPLANT
BAG PRSS INFS XL SQZ BLB BUIL (BAG) ×4
BAG URINE LEG 19OZ MD ST LTX (BAG) ×3 IMPLANT
BASKET LASER NITINOL 1.9FR (BASKET) ×3 IMPLANT
BASKET ZERO TIP NITINOL 2.4FR (BASKET) IMPLANT
BSKT STON RTRVL 120 1.9FR (BASKET)
BSKT STON RTRVL ZERO TP 2.4FR (BASKET)
CATH FOLEY 3WAY 30CC 24FR (CATHETERS) ×6
CATH INTERMIT  6FR 70CM (CATHETERS) IMPLANT
CATH URTH STD 24FR FL 3W 2 (CATHETERS) ×1 IMPLANT
CLOTH BEACON ORANGE TIMEOUT ST (SAFETY) ×9 IMPLANT
ELECT LOOP 22F BIPOLAR SML (ELECTROSURGICAL) ×6
ELECT REM PT RETURN 9FT ADLT (ELECTROSURGICAL) ×6
ELECTRODE LOOP 22F BIPOLAR SML (ELECTROSURGICAL) ×1 IMPLANT
ELECTRODE REM PT RTRN 9FT ADLT (ELECTROSURGICAL) ×1 IMPLANT
GLOVE BIO SURGEON STRL SZ7.5 (GLOVE) ×6 IMPLANT
GLOVE BIOGEL PI IND STRL 7.0 (GLOVE) ×1 IMPLANT
GLOVE BIOGEL PI IND STRL 7.5 (GLOVE) ×1 IMPLANT
GLOVE BIOGEL PI INDICATOR 7.0 (GLOVE) ×2
GLOVE BIOGEL PI INDICATOR 7.5 (GLOVE) ×2
GLOVE ECLIPSE 7.0 STRL STRAW (GLOVE) ×3 IMPLANT
GOWN STRL REUS W/ TWL LRG LVL3 (GOWN DISPOSABLE) ×6 IMPLANT
GOWN STRL REUS W/ TWL XL LVL3 (GOWN DISPOSABLE) ×5 IMPLANT
GOWN STRL REUS W/TWL LRG LVL3 (GOWN DISPOSABLE)
GOWN STRL REUS W/TWL XL LVL3 (GOWN DISPOSABLE) ×12
GUIDEWIRE ANG ZIPWIRE 038X150 (WIRE) ×6 IMPLANT
GUIDEWIRE STR DUAL SENSOR (WIRE) ×6 IMPLANT
HOLDER FOLEY CATH W/STRAP (MISCELLANEOUS) ×3 IMPLANT
IV NS 1000ML (IV SOLUTION)
IV NS 1000ML BAXH (IV SOLUTION) ×3 IMPLANT
IV NS IRRIG 3000ML ARTHROMATIC (IV SOLUTION) ×12 IMPLANT
KIT ROOM TURNOVER WOR (KITS) ×6 IMPLANT
MANIFOLD NEPTUNE II (INSTRUMENTS) ×3 IMPLANT
NS IRRIG 500ML POUR BTL (IV SOLUTION) ×3 IMPLANT
PACK CYSTO (CUSTOM PROCEDURE TRAY) ×6 IMPLANT
SYRINGE 10CC LL (SYRINGE) ×6 IMPLANT
TUBE CONNECTING 12'X1/4 (SUCTIONS) ×1
TUBE CONNECTING 12X1/4 (SUCTIONS) ×2 IMPLANT
TUBE FEEDING 8FR 16IN STR KANG (MISCELLANEOUS) ×3 IMPLANT
WATER STERILE IRR 3000ML UROMA (IV SOLUTION) ×3 IMPLANT
WATER STERILE IRR 500ML POUR (IV SOLUTION) ×3 IMPLANT

## 2015-12-29 NOTE — H&P (Signed)
Brenda [name] is an 82 y.o. female.    Chief Complaint: Pre-op Cystoscopy with bladder biopsy, retrograde pyelograms  HPI:   1 - Bladder Erythema / Rule Out Bladder Cancer - significant mucosal erythema / edema by cysto 2017 by MacDiarmid on eval persistant dysuria and hematuria. Non-smoker. No chronic solvent exposure. CT 2017 with thickened bladder, bilat mod hydro to bladder, no adenopathy.   2 - Recurrent Cystitis - few episodes per year simple cystitis. No febrile / pyelo episodes. PVR 2017 157mL (not elevated). CT 2017 with some bilat hydro / no stones. Most recent UCX's negative x2.   3 - Bilateral Hydronephrosis - no bilateral mild-mod hydro to bladder by CT 2017. NO stones / frank masses. PVR 151mL (normal). Cr 0.8  PMH sig for back surgery x 3 (uses walker, but no frank deficits), No CV disease / blood thinners. Her PCP is Burnard Bunting MD.   Today "Flow" is seen to proceed with operative cysto / bladder biopsy / retrogrades to rule out urothelial malignancy. Most recent UCX negative.    Past Medical History:  Diagnosis Date  . Erythematous bladder mucosa   . Frequent urination   . Gait instability    uses walker  . GERD (gastroesophageal reflux disease)   . History of basal cell carcinoma excision    yrs ago-  nose  . History of gout    last bout yrs ago  . History of recurrent UTIs   . Hydronephrosis   . Hyperlipidemia   . Hypertension   . Nocturia   . Numbness and tingling of both legs    residual  from spinal stenosis and tumor  s/p  back surgery's  uses walker  . OA (osteoarthritis)   . Urge urinary incontinence   . Wears glasses     Past Surgical History:  Procedure Laterality Date  . CARPAL TUNNEL RELEASE Right early 2000's  . CATARACT EXTRACTION W/ INTRAOCULAR LENS IMPLANT Right yrs ago  . CERVICAL FUSION  1999   C5 -- C6  . KNEE ARTHROSCOPY Right 2010  approx  . POSTERIOR LAMINECTOMY / DECOMPRESSION LUMBAR SPINE  10/28/2014   lam.  L2 -- L4/   decompression L2 -- L5/  fusion L4-5  . THORACIC LAMINECTOMY  01/30/2011   lam. T9 -- T11 /  Resection Intraspinal extradural extramedullary tumor    Family History  Problem Relation Age of Onset  . Arthritis Mother   . Cancer Father    Social History:  reports that she has never smoked. She has never used smokeless tobacco. She reports that she does not drink alcohol or use drugs.  Allergies: No Known Allergies  Medications Prior to Admission  Medication Sig Dispense Refill  . acetaminophen (TYLENOL) 500 MG tablet Take 1,000 mg by mouth every 6 (six) hours as needed for moderate pain.     . fish oil-omega-3 fatty acids 1000 MG capsule Take 2 g by mouth daily.    Marland Kitchen glucosamine-chondroitin 500-400 MG tablet Take 2 tablets by mouth 3 (three) times daily.     Marland Kitchen lisinopril (PRINIVIL,ZESTRIL) 20 MG tablet Take 20 mg by mouth daily.    . Multiple Vitamin (MULTIVITAMIN) tablet Take 1 tablet by mouth daily.    . Naproxen Sodium (ALEVE) 220 MG CAPS Take by mouth as needed.    Marland Kitchen OVER THE COUNTER MEDICATION Take 1 tablet by mouth daily as needed (immune systetm boost). OPC-3    . Probiotic Product (PHILLIPS COLON HEALTH) CAPS Take 1 capsule  by mouth daily.    . ranitidine (ZANTAC) 150 MG tablet Take 150 mg by mouth as needed for heartburn.    . vitamin B-12 (CYANOCOBALAMIN) 100 MCG tablet Take 100 mcg by mouth daily.    . polyethylene glycol (MIRALAX / GLYCOLAX) packet Take 17 g by mouth daily as needed for moderate constipation.    . sennosides-docusate sodium (SENOKOT-S) 8.6-50 MG tablet Take 1 tablet by mouth daily as needed.       No results found for this or any previous visit (from the past 48 hour(s)). No results found.  Review of Systems  Constitutional: Negative.  Negative for chills and fever.  HENT: Negative.   Eyes: Negative.   Respiratory: Negative.   Cardiovascular: Negative.   Gastrointestinal: Negative.   Genitourinary: Positive for dysuria, frequency and urgency.   Musculoskeletal: Negative.   Skin: Negative.   Neurological: Negative.   Endo/Heme/Allergies: Negative.   Psychiatric/Behavioral: Negative.     Height 5\' 3"  (1.6 m), weight 65.8 kg (145 lb). Physical Exam  Constitutional: She appears well-developed.  HENT:  Head: Normocephalic.  Eyes: Pupils are equal, round, and reactive to light.  Neck: Normal range of motion.  Respiratory: Effort normal.  GI: Soft.  Genitourinary:  Genitourinary Comments: No CVAT  Musculoskeletal: Normal range of motion.  Neurological: She is alert.  Skin: Skin is warm.  Psychiatric: She has a normal mood and affect. Thought content normal.     Assessment/Plan  Agree overall picture worrisome for possibe bladder cancer. Rec operative cysto / bilat retrogrades / possible bilateral ureteroscopy + stents, and bladder biopsies next avail. Risks, benefits, alternatives, need for possible peri-op ureteral stents and foley discussed. Pt and family voiced understanding.    Alexis Frock, MD 12/29/2015, 6:53 AM

## 2015-12-29 NOTE — Anesthesia Procedure Notes (Signed)
Procedure Name: LMA Insertion Date/Time: 12/29/2015 7:30 AM Performed by: Wanita Chamberlain Pre-anesthesia Checklist: Patient identified, Timeout performed, Emergency Drugs available, Suction available and Patient being monitored Patient Re-evaluated:Patient Re-evaluated prior to inductionOxygen Delivery Method: Circle system utilized Preoxygenation: Pre-oxygenation with 100% oxygen Intubation Type: IV induction Ventilation: Mask ventilation without difficulty LMA: LMA inserted LMA Size: 4.0 Number of attempts: 1 Airway Equipment and Method: Bite block Placement Confirmation: positive ETCO2 and breath sounds checked- equal and bilateral Tube secured with: Tape Dental Injury: Teeth and Oropharynx as per pre-operative assessment

## 2015-12-29 NOTE — Op Note (Signed)
NAME:  Brenda [name], Brenda [name] NO.:  [id number]  MEDICAL RECORD NO.:  [id number]  LOCATION:                                 FACILITY:  PHYSICIAN:  Alexis Frock, MD     DATE OF BIRTH:  May 23, 1930  DATE OF PROCEDURE: 12/29/2015                               OPERATIVE REPORT  PREOPERATIVE DIAGNOSES:  Bladder erythema, bilateral hydronephrosis, concern for possible neoplasm.  POSTOPERATIVE DIAGNOSES:  Bladder erythema, bilateral hydronephrosis, concern for possible neoplasm, likely partially patent urachus, very small capacity bladder.  PROCEDURES: 1. Cystoscopy with right retrograde pyelogram interpretation. 2. Bladder biopsy with fulguration.  ESTIMATED BLOOD LOSS:  100 mL.  COMPLICATIONS:  None.  SPECIMEN:  Bladder dome, bladder posterior, bladder right wall, bladder left wall, separate biopsies, for permanent pathology.  FINDINGS: 1. Severely inflamed bladder with beefy red erythema in all quadrants.     Suspect partially patent urachus. 2. Significant bullous edema versus neoplastic changes of the dome     near the area of suspected partial patent urachus. 3. Moderate-to-severe right hydroureteronephrosis. 4. Nonvisualization of the left ureteral orifice. 5. Cystometric capacity, 175 mL.  DRAINS:  A 24-French 3-way Foley catheter to normal saline irrigation. Efflux light pink.  INDICATION:  Brenda [name] is an 82 year old lady who was found on workup of severe irritative voiding symptoms and urge incontinence.  Does have significantly diffuse erythema of the bladder, worrisome for possible carcinoma.  She has bilateral hydroureteronephrosis to the level of the bladder.  There was no obvious pelvic lymphadenopathy.  Options were discussed for management including operative cystoscopy and biopsy for diagnostic intent.  She wished to proceed.  Informed consent was obtained and placed in the medical record.  DESCRIPTION OF PROCEDURE:  The patient being Shaula Parady verified, procedures being cystoscopy, bladder biopsy, and retrograde pyelography were confirmed.  Procedure was carried out.  Time-out was performed. Intravenous antibiotics were administered.  General LMA anesthesia introduced.  The patient was placed into a low lithotomy position. Sterile field was created by prepping and draping the patient's vagina, introitus, and proximal thighs using iodine x3.  Next, cystourethroscopy was performed using a 21-French rigid cystoscope with offset lens. Inspection of bladder revealed very impressive beefy red erythema in all quadrants.  There were some bullous changes of the dome and the dome was somewhat patent, suggesting at least partially patent urachal remnant. This overall picture was concerning for likely Hunner ulcer disease versus possible neoplasm including urachal adenocarcinoma.  The right ureteral orifice was very laterally displaced.  The left ureteral orifice was not visualized.  The right ureteral orifice was cannulated with a 6-French end-hole catheter and right retrograde pyelogram was obtained.  Right retrograde pyelogram demonstrated a single right ureter with single-system right kidney.  There was a significant hydroureteronephrosis to the level of the ureterovesical junction. There were no obvious masses.  This was felt to likely be a partial obstruction at the area of the UVJ.  Given her kidney function is normal and the fact that stent would likely worsen bladder inflammation, decision was made not to place ureteral stent.  Significant time was spent trying to identify the left ureteral orifice, however, was not  able to be visualized due to the very impressive inflammation.  Multiple attempts were performed trying to cannulate angle tip Glidewire and to the area of suspected left ureteral orifice, but no cannulation was successful.  Attention was then directed to bladder biopsy.  Cold cup biopsies were obtained of the  posterior wall, right wall, left wall, and dome respectively, taking seromuscular bites, but taking exquisite care to avoid bladder perforation which did not occur.  Point electrocautery with Bovie electrode was used to provide hemostasis at the site. Following these maneuvers, there was still significant mucosal oozing from the significant bladder inflammation.  Decision was made to exchange the cystoscope for 26-French continuous flow resectoscope with a small loop and fulguration current was applied to the areas of mucosal bleeding which resulted in much better hemostasis, but still not complete.  The residual bleeding being very diffuse in nature, not from any focal bleeding.  The bladder capacity did appear quite small cystoscopically, this was measured and found to be 175 mL, consistent with significantly compromised capacity due to infectious versus neoplastic process.  The cystoscope was then exchanged for a 24-French 3- way Foley catheter, 10 mL sterile water in the balloon, this was normal saline irrigation.  The efflux was light pink.  My plan will be to admit for observation and bladder irrigation with discharge home after ensuring adequate bladder hemostasis.    ______________________________ Alexis Frock, MD   ______________________________ Alexis Frock, MD    TM/MEDQ  D:  12/29/2015  T:  12/29/2015  Job:  [id number]

## 2015-12-29 NOTE — Anesthesia Preprocedure Evaluation (Addendum)
Anesthesia Evaluation  Patient identified by MRN, date of birth, ID band Patient awake    Reviewed: Allergy & Precautions, NPO status , Patient's Chart, lab work & pertinent test results  History of Anesthesia Complications Negative for: history of anesthetic complications  Airway Mallampati: I  TM Distance: >3 FB Neck ROM: Full    Dental  (+) Teeth Intact, Dental Advisory Given, Caps,    Pulmonary neg pulmonary ROS,    breath sounds clear to auscultation       Cardiovascular hypertension, Pt. on medications  Rhythm:Regular Rate:Normal     Neuro/Psych Gait instability    GI/Hepatic Neg liver ROS, GERD  ,  Endo/Other    Renal/GU Renal InsufficiencyRenal disease     Musculoskeletal  (+) Arthritis ,   Abdominal   Peds  Hematology   Anesthesia Other Findings   Reproductive/Obstetrics                           Anesthesia Physical Anesthesia Plan  ASA: II  Anesthesia Plan: General   Post-op Pain Management:    Induction: Intravenous  Airway Management Planned: LMA  Additional Equipment:   Intra-op Plan:   Post-operative Plan: Extubation in OR  Informed Consent: I have reviewed the patients History and Physical, chart, labs and discussed the procedure including the risks, benefits and alternatives for the proposed anesthesia with the patient or authorized representative who has indicated his/her understanding and acceptance.   Dental advisory given  Plan Discussed with: CRNA and Anesthesiologist  Anesthesia Plan Comments:        Anesthesia Quick Evaluation

## 2015-12-29 NOTE — Brief Op Note (Signed)
12/29/2015  9:27 AM  PATIENT:  Brenda [name]  82 y.o. female  PRE-OPERATIVE DIAGNOSIS:  BLADDER ERYTHEMA, HYDRONEPHROSIS  POST-OPERATIVE DIAGNOSIS:  BLADDER ERYTHEMA, HYDRONEPHROSIS, VERY SMALL CAPACITY BLADDER  PROCEDURE:  Procedure(s): CYSTOSCOPY WITH BIOPSY CYSTOSCOPY WITH FULGERATION (N/A) CYSTOSCOPY WITH RETROGRADE PYELOGRAM (Right)  SURGEON:  Surgeon(s) and Role:    * Alexis Frock, MD - Primary  PHYSICIAN ASSISTANT:   ASSISTANTS: none   ANESTHESIA:   general  EBL:  Total I/O In: 700 [I.V.:700] Out: 20 [Blood:20]  BLOOD ADMINISTERED:none  DRAINS: 52F 3 way foley to NS irrigation   LOCAL MEDICATIONS USED:  NONE  SPECIMEN:  Source of Specimen:  Bladder Biopsy: dome, posterior, Rt wall, Lt wall  DISPOSITION OF SPECIMEN:  PATHOLOGY  COUNTS:  YES  TOURNIQUET:  * No tourniquets in log *  DICTATION: .Other Dictation: Dictation Number [id number]  PLAN OF CARE: Admit for overnight observation  PATIENT DISPOSITION:  PACU - hemodynamically stable.   Delay start of Pharmacological VTE agent (>24hrs) due to surgical blood loss or risk of bleeding: yes

## 2015-12-29 NOTE — Transfer of Care (Signed)
Immediate Anesthesia Transfer of Care Note  Patient: Brenda [name]  Procedure(s) Performed: Procedure(s): CYSTOSCOPY WITH BIOPSY CYSTOSCOPY WITH FULGERATION (N/A) CYSTOSCOPY WITH RETROGRADE PYELOGRAM (Right)  Patient Location: PACU  Anesthesia Type:General  Level of Consciousness: awake, alert , oriented and patient cooperative  Airway & Oxygen Therapy: Patient Spontanous Breathing and Patient connected to nasal cannula oxygen  Post-op Assessment: Report given to RN and Post -op Vital signs reviewed and stable  Post vital signs: Reviewed and stable  Last Vitals:  Vitals:   12/29/15 0708 12/29/15 0942  BP: (!) 178/77   Resp: 18   Temp: 37 C (P) 36.4 C    Last Pain:  Vitals:   12/29/15 0708  TempSrc: Oral      Patients Stated Pain Goal: 7 ([id number])  Complications: No apparent anesthesia complications

## 2015-12-29 NOTE — Anesthesia Postprocedure Evaluation (Signed)
Anesthesia Post Note  Patient: Brenda [name]  Procedure(s) Performed: Procedure(s) (LRB): CYSTOSCOPY WITH BIOPSY CYSTOSCOPY WITH FULGERATION (N/A) CYSTOSCOPY WITH RETROGRADE PYELOGRAM (Right)  Patient location during evaluation: PACU Anesthesia Type: General Level of consciousness: awake and sedated Pain management: pain level controlled Vital Signs Assessment: post-procedure vital signs reviewed and stable Respiratory status: spontaneous breathing, nonlabored ventilation, respiratory function stable and patient connected to nasal cannula oxygen Cardiovascular status: blood pressure returned to baseline and stable Postop Assessment: no signs of nausea or vomiting Anesthetic complications: no    Last Vitals:  Vitals:   12/29/15 0708 12/29/15 0942  BP: (!) 178/77 (!) 169/75  Pulse:  81  Resp: 18 13  Temp: 37 C 36.4 C    Last Pain:  Vitals:   12/29/15 0708  TempSrc: Oral                 Brenda [name],JAMES TERRILL

## 2015-12-29 NOTE — Progress Notes (Signed)
Received pt from PACU, VS obtained, foley cath w/CBI intact, oriented to unit, call light placed in reach

## 2015-12-30 ENCOUNTER — Encounter (HOSPITAL_BASED_OUTPATIENT_CLINIC_OR_DEPARTMENT_OTHER): Payer: Self-pay | Admitting: Urology

## 2015-12-30 DIAGNOSIS — N3289 Other specified disorders of bladder: Secondary | ICD-10-CM | POA: Diagnosis not present

## 2015-12-30 LAB — POCT I-STAT, CHEM 8
BUN: 27 mg/dL — ABNORMAL HIGH (ref 6–20)
CALCIUM ION: 1.02 mmol/L — AB (ref 1.12–1.23)
Chloride: 110 mmol/L (ref 101–111)
Creatinine, Ser: 1.6 mg/dL — ABNORMAL HIGH (ref 0.44–1.00)
GLUCOSE: 100 mg/dL — AB (ref 65–99)
HCT: 42 % (ref 36.0–46.0)
HEMOGLOBIN: 14.3 g/dL (ref 12.0–15.0)
Potassium: 4.1 mmol/L (ref 3.5–5.1)
SODIUM: 143 mmol/L (ref 135–145)
TCO2: 23 mmol/L (ref 0–100)

## 2015-12-30 MED ORDER — SODIUM CHLORIDE 0.9 % IV BOLUS (SEPSIS)
250.0000 mL | Freq: Once | INTRAVENOUS | Status: AC
  Administered 2015-12-30: 250 mL via INTRAVENOUS

## 2015-12-30 MED ORDER — TRAMADOL HCL 50 MG PO TABS: 50.0000 mg | ORAL_TABLET | Freq: Four times a day (QID) | ORAL | 0 refills | Status: DC | PRN

## 2015-12-30 NOTE — Progress Notes (Signed)
Nutrition Brief Note  Patient identified on the Malnutrition Screening Tool (MST) Report  Pt and family report that pt had lost weight after her last back surgery, about 20 lb. She has remained stable and even gained a few pounds since Christmas 2016. Appetite is better. She is more mobile only uses walker when she is out of the house.  Breakfast cereal or egg and english muffin Lunch: 1/2 sandwich with chips and fruit Dinner: eat out, veggie plate maybe chicken Pt hopes to d/c home today after biopsy.  Encouraged continued good intake and discussed importance of adequate nutrition to prevent unintentional weight loss.   Wt Readings from Last 15 Encounters:  12/29/15 143 lb (64.9 kg)  12/13/14 165 lb 6.4 oz (75 kg)  11/17/14 169 lb 6.4 oz (76.8 kg)  11/04/14 167 lb 6.4 oz (75.9 kg)  11/03/14 164 lb (74.4 kg)  10/28/14 164 lb 12.8 oz (74.8 kg)  10/20/14 164 lb 12.8 oz (74.8 kg)  10/10/11 156 lb (70.8 kg)    Body mass index is 25.33 kg/m. Patient meets criteria for overweight based on current BMI.   Current diet order is Regular, patient is consuming approximately 100% of meals at this time. Labs and medications reviewed.   No nutrition interventions warranted at this time. If nutrition issues arise, please consult RD.   [place], [place], [place] Pager [phone] After Hours Pager

## 2015-12-30 NOTE — Discharge Instructions (Signed)
1 - You may have urinary urgency (bladder spasms) and bloody urine on / off. This is normal.  2 - Call MD or go to ER for fever >102, severe pain / nausea / vomiting not relieved by medications, or acute change in medical status  

## 2015-12-30 NOTE — Discharge Summary (Signed)
Physician Discharge Summary  Patient ID: Brenda [name] MRN: [id number] DOB/AGE: [birth year] 82 y.o.  Admit date: 12/29/2015 Discharge date: 12/30/2015  Admission Diagnoses: Gross Hematuria  Discharge Diagnoses:  Active Problems:   Gross hematuria   Discharged Condition: good  Hospital Course:   Gross Hematuria - pt underwent cystoscopy, right retrograde pyelogram, and bladder biopsy with fulgeration on 8/16, the day of admission. She had significant diffuse mucosal bleeding therefore observed overnight on slow NS bladder irrigation whish was discontineued on AM of POD 1. By the afternoon of POD 1 her pain was controlled, tollerating PO intake, resolved gross hematuria, catehter removed and felt adequate for discharge.   Pathology pending at discharge.   Consults: None  Significant Diagnostic Studies: labs: as per above  Treatments: surgery: cystoscopy, right retrograde pyelogram, and bladder biopsy with fulgeration on 8/16  Discharge Exam: Blood pressure (!) 177/74, pulse (!) 57, temperature 98 F (36.7 C), temperature source Oral, resp. rate 18, height 5\' 3"  (1.6 m), weight 64.9 kg (143 lb), SpO2 98 %. General appearance: alert, cooperative and appears stated age Eyes: negative Nose: Nares normal. Septum midline. Mucosa normal. No drainage or sinus tenderness. Throat: lips, mucosa, and tongue normal; teeth and gums normal Neck: supple, symmetrical, trachea midline Back: symmetric, no curvature. ROM normal. No CVA tenderness. Chest wall: no tenderness Cardio: Nl rate GI: soft, non-tender; bowel sounds normal; no masses,  no organomegaly Extremities: extremities normal, atraumatic, no cyanosis or edema Pulses: 2+ and symmetric Skin: Skin color, texture, turgor normal. No rashes or lesions Lymph nodes: Cervical, supraclavicular, and axillary nodes normal. Neurologic: Grossly normal  Foley removed as urine light tea colored and thin off irrigation x 6 hours.   Disposition:  06-Home-Health Care Svc     Medication List    TAKE these medications   acetaminophen 500 MG tablet Commonly known as:  TYLENOL Take 1,000 mg by mouth every 6 (six) hours as needed for moderate pain.   ALEVE 220 MG Caps Generic drug:  Naproxen Sodium Take by mouth as needed.   ASPERCREME EX Apply 1 application topically daily as needed (leg pain.).   fish oil-omega-3 fatty acids 1000 MG capsule Take 2 g by mouth daily.   glucosamine-chondroitin 500-400 MG tablet Take 2 tablets by mouth 3 (three) times daily.   lisinopril 20 MG tablet Commonly known as:  PRINIVIL,ZESTRIL Take 20 mg by mouth daily.   multivitamin tablet Take 1 tablet by mouth daily.   OVER THE COUNTER MEDICATION Take 1 tablet by mouth daily as needed (immune systetm boost). OPC-3   PHILLIPS COLON HEALTH Caps Take 1 capsule by mouth daily.   polyethylene glycol packet Commonly known as:  MIRALAX / GLYCOLAX Take 17 g by mouth daily as needed for moderate constipation.   ranitidine 150 MG tablet Commonly known as:  ZANTAC Take 150 mg by mouth as needed for heartburn.   sennosides-docusate sodium 8.6-50 MG tablet Commonly known as:  SENOKOT-S Take 1 tablet by mouth daily as needed for constipation.   traMADol 50 MG tablet Commonly known as:  ULTRAM Take 1 tablet (50 mg total) by mouth every 6 (six) hours as needed for moderate pain. Post-operatively   vitamin B-12 100 MCG tablet Commonly known as:  CYANOCOBALAMIN Take 100 mcg by mouth daily.      Follow-up Information    Alexis Frock, MD Follow up on 01/13/2016.   Specialty:  Urology Why:  at 9:00 AM for MD visit. Dr. Tresa Moore will call you with pathology results  when available.  Contact information: [place] [address] [phone]           Signed: Alexis Frock 12/30/2015, 2:01 PM

## 2018-12-25 ENCOUNTER — Encounter: Payer: Self-pay | Admitting: Orthopaedic Surgery

## 2018-12-25 ENCOUNTER — Ambulatory Visit: Payer: Self-pay

## 2018-12-25 ENCOUNTER — Other Ambulatory Visit: Payer: Self-pay

## 2018-12-25 ENCOUNTER — Ambulatory Visit (INDEPENDENT_AMBULATORY_CARE_PROVIDER_SITE_OTHER): Payer: Medicare Other | Admitting: Orthopaedic Surgery

## 2018-12-25 DIAGNOSIS — M25562 Pain in left knee: Secondary | ICD-10-CM | POA: Diagnosis not present

## 2018-12-25 MED ORDER — DICLOFENAC SODIUM 1 % TD GEL: 2.0000 g | Freq: Four times a day (QID) | TRANSDERMAL | 12 refills | Status: DC

## 2018-12-25 MED ORDER — MELOXICAM 7.5 MG PO TABS: 7.5000 mg | ORAL_TABLET | Freq: Two times a day (BID) | ORAL | 2 refills | Status: DC | PRN

## 2018-12-25 NOTE — Progress Notes (Signed)
Office Visit Note   Patient: Brenda [name]           Date of Birth: October 28, 1930           MRN: [id number] Visit Date: 12/25/2018              Requested by: Burnard Bunting, MD [address] [place],   [address] PCP: Burnard Bunting, MD   Assessment & Plan: Visit Diagnoses:  1. 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  I do agree that a knee replacement would be the only good option for meaningful pain relief but given her age and medical comorbidities I do not feel that she would fully realize the benefits of a knee replacement has rehab and recovery would be quite difficult for her.  I think it is best to treat her DJD nonsurgically  We did discuss Visco supplementation and judicious use of oral NSAIDs.  I do feel that topical NSAID gel would be a great option.  I did provide her with a pamphlet on Synvisc injection.  Fortunately she is starting to see some relief from the cortisone injection she got last week.  Hopefully this will give her at least a few months of relief.  I will see her back as needed.  Follow-Up Instructions: Return if symptoms worsen or fail to improve.   Orders:  Orders Placed This Encounter  Procedures  . XR KNEE 3 VIEW LEFT   Meds ordered this encounter  Medications  . diclofenac sodium (VOLTAREN) 1 % GEL    Sig: Apply 2 g topically 4 (four) times daily.    Dispense:  150 g    Refill:  12  . meloxicam (MOBIC) 7.5 MG tablet    Sig: Take 1 tablet (7.5 mg total) by mouth 2 (two) times daily as needed for pain.    Dispense:  30 tablet    Refill:  2      Procedures: No procedures performed   Clinical Data: No additional findings.   Subjective: Chief Complaint  Patient presents with  . Left Knee - Pain    [name] is a very pleasant 85 year old female who is the mother-in-law of Brenda [name] who I took care of for a traumatic Achilles laceration several months ago.   She comes in for evaluation of left knee pain.  She ambulates with a Rollator.  She recently had severe left knee pain and was evaluated at another orthopedic office.  She received a cortisone injection which she feels has now started to work.  She has been told that she needs a knee replacement but given her age she is not interested.   Review of Systems  Constitutional: Negative.   HENT: Negative.   Eyes: Negative.   Respiratory: Negative.   Cardiovascular: Negative.   Endocrine: Negative.   Musculoskeletal: Negative.   Neurological: Negative.   Hematological: Negative.   Psychiatric/Behavioral: Negative.   All other systems reviewed and are negative.    Objective: Vital Signs: There were no vitals taken for this visit.  Physical Exam Vitals signs and nursing note reviewed.  Constitutional:      Appearance: She is well-developed.  HENT:     Head: Normocephalic and atraumatic.  Neck:     Musculoskeletal: Neck supple.  Pulmonary:     Effort: Pulmonary effort is normal.  Abdominal:     Palpations: Abdomen is soft.  Skin:  General: Skin is warm.     Capillary Refill: Capillary refill takes less than 2 seconds.  Neurological:     Mental Status: She is alert and oriented to person, place, and time.  Psychiatric:        Behavior: Behavior normal.        Thought Content: Thought content normal.        Judgment: Judgment normal.     Ortho Exam Left knee exam shows no significant joint effusion.  Significant patellofemoral crepitus. Specialty Comments:  No specialty comments available.  Imaging: Xr Knee 3 View Left  Result Date: 12/25/2018 Significant tricompartmental degenerative joint disease worse in the patellofemoral compartment    PMFS History: Patient Active Problem List   Diagnosis Date Noted  . Gross hematuria 12/29/2015  . Sepsis secondary to UTI ([place]) 12/12/2014  . Essential hypertension 12/12/2014  . Spondylolisthesis of lumbar region 10/28/2014   . T10 spinal cord injury ([place]) 10/10/2011  . 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 Erythematous bladder mucosa   . Frequent urination   . Gait instability    uses walker  . GERD (gastroesophageal reflux disease)   . History of basal cell carcinoma excision    yrs ago-  nose  . History of gout    last bout yrs ago  . History of recurrent UTIs   . Hydronephrosis   . Hyperlipidemia   . Hypertension   . Nocturia   . Numbness and tingling of both legs    residual  from spinal stenosis and tumor  s/p  back surgery's  uses walker  . OA (osteoarthritis)   . Urge urinary incontinence   . Wears glasses     Family History  Problem Relation Age of Onset  . Arthritis Mother   . Cancer Father     Past Surgical History:  Procedure Laterality Date  . CARPAL TUNNEL RELEASE Right early 2000's  . CATARACT EXTRACTION W/ INTRAOCULAR LENS IMPLANT Right yrs ago  . CERVICAL FUSION  1999   C5 -- C6  . CYSTOSCOPY W/ RETROGRADES Right 12/29/2015   Procedure: CYSTOSCOPY WITH RETROGRADE PYELOGRAM;  Surgeon: Alexis Frock, MD;  Location: [facility];  Service: Urology;  Laterality: Right;  . CYSTOSCOPY WITH BIOPSY  12/29/2015   Procedure: CYSTOSCOPY WITH BIOPSY;  Surgeon: Alexis Frock, MD;  Location: [facility];  Service: Urology;;  . [place] N/A 12/29/2015   Procedure: CYSTOSCOPY WITH FULGERATION;  Surgeon: Alexis Frock, MD;  Location: [facility]  Service: Urology;  Laterality: N/A;  . KNEE ARTHROSCOPY Right 2010  approx  . POSTERIOR LAMINECTOMY / DECOMPRESSION LUMBAR SPINE  10/28/2014   lam.  L2 -- L4/  decompression L2 -- L5/  fusion L4-5  . THORACIC LAMINECTOMY  01/30/2011   lam. T9 -- T11 /  Resection Intraspinal extradural extramedullary tumor   Social History   Occupational History  . Not on file  Tobacco Use  . Smoking status: Never Smoker  . Smokeless tobacco: Never Used   Substance and Sexual Activity  . Alcohol use: No  . Drug use: No  . Sexual activity: Not on file

## 2020-01-05 ENCOUNTER — Other Ambulatory Visit: Payer: Self-pay | Admitting: Orthopaedic Surgery

## 2020-01-21 DIAGNOSIS — M109 Gout, unspecified: Secondary | ICD-10-CM | POA: Diagnosis not present

## 2020-01-21 DIAGNOSIS — E785 Hyperlipidemia, unspecified: Secondary | ICD-10-CM | POA: Diagnosis not present

## 2020-01-21 DIAGNOSIS — I129 Hypertensive chronic kidney disease with stage 1 through stage 4 chronic kidney disease, or unspecified chronic kidney disease: Secondary | ICD-10-CM | POA: Diagnosis not present

## 2020-01-27 DIAGNOSIS — Z85828 Personal history of other malignant neoplasm of skin: Secondary | ICD-10-CM | POA: Diagnosis not present

## 2020-01-27 DIAGNOSIS — L578 Other skin changes due to chronic exposure to nonionizing radiation: Secondary | ICD-10-CM | POA: Diagnosis not present

## 2020-01-27 DIAGNOSIS — D485 Neoplasm of uncertain behavior of skin: Secondary | ICD-10-CM | POA: Diagnosis not present

## 2020-01-27 DIAGNOSIS — L821 Other seborrheic keratosis: Secondary | ICD-10-CM | POA: Diagnosis not present

## 2020-01-27 DIAGNOSIS — L570 Actinic keratosis: Secondary | ICD-10-CM | POA: Diagnosis not present

## 2020-01-27 DIAGNOSIS — B353 Tinea pedis: Secondary | ICD-10-CM | POA: Diagnosis not present

## 2020-01-27 DIAGNOSIS — Z808 Family history of malignant neoplasm of other organs or systems: Secondary | ICD-10-CM | POA: Diagnosis not present

## 2020-01-27 DIAGNOSIS — C44329 Squamous cell carcinoma of skin of other parts of face: Secondary | ICD-10-CM | POA: Diagnosis not present

## 2020-01-28 DIAGNOSIS — I129 Hypertensive chronic kidney disease with stage 1 through stage 4 chronic kidney disease, or unspecified chronic kidney disease: Secondary | ICD-10-CM | POA: Diagnosis not present

## 2020-01-28 DIAGNOSIS — R82998 Other abnormal findings in urine: Secondary | ICD-10-CM | POA: Diagnosis not present

## 2020-01-28 DIAGNOSIS — N3281 Overactive bladder: Secondary | ICD-10-CM | POA: Diagnosis not present

## 2020-01-28 DIAGNOSIS — G10 Huntington's disease: Secondary | ICD-10-CM | POA: Diagnosis not present

## 2020-01-28 DIAGNOSIS — Z23 Encounter for immunization: Secondary | ICD-10-CM | POA: Diagnosis not present

## 2020-01-28 DIAGNOSIS — Z0000 Encounter for general adult medical examination without abnormal findings: Secondary | ICD-10-CM | POA: Diagnosis not present

## 2020-01-28 DIAGNOSIS — M1990 Unspecified osteoarthritis, unspecified site: Secondary | ICD-10-CM | POA: Diagnosis not present

## 2020-01-28 DIAGNOSIS — N184 Chronic kidney disease, stage 4 (severe): Secondary | ICD-10-CM | POA: Diagnosis not present

## 2020-01-28 DIAGNOSIS — I10 Essential (primary) hypertension: Secondary | ICD-10-CM | POA: Diagnosis not present

## 2020-02-26 DIAGNOSIS — C44329 Squamous cell carcinoma of skin of other parts of face: Secondary | ICD-10-CM | POA: Diagnosis not present

## 2020-03-08 DIAGNOSIS — Z1212 Encounter for screening for malignant neoplasm of rectum: Secondary | ICD-10-CM | POA: Diagnosis not present

## 2020-07-28 DIAGNOSIS — R21 Rash and other nonspecific skin eruption: Secondary | ICD-10-CM | POA: Diagnosis not present

## 2020-07-28 DIAGNOSIS — I129 Hypertensive chronic kidney disease with stage 1 through stage 4 chronic kidney disease, or unspecified chronic kidney disease: Secondary | ICD-10-CM | POA: Diagnosis not present

## 2020-07-28 DIAGNOSIS — N184 Chronic kidney disease, stage 4 (severe): Secondary | ICD-10-CM | POA: Diagnosis not present

## 2020-09-15 DIAGNOSIS — L2084 Intrinsic (allergic) eczema: Secondary | ICD-10-CM | POA: Diagnosis not present

## 2020-09-15 DIAGNOSIS — L821 Other seborrheic keratosis: Secondary | ICD-10-CM | POA: Diagnosis not present

## 2020-09-15 DIAGNOSIS — L570 Actinic keratosis: Secondary | ICD-10-CM | POA: Diagnosis not present

## 2020-09-23 DIAGNOSIS — E878 Other disorders of electrolyte and fluid balance, not elsewhere classified: Secondary | ICD-10-CM | POA: Diagnosis not present

## 2020-09-23 DIAGNOSIS — N179 Acute kidney failure, unspecified: Secondary | ICD-10-CM | POA: Diagnosis not present

## 2020-09-23 DIAGNOSIS — N390 Urinary tract infection, site not specified: Secondary | ICD-10-CM | POA: Diagnosis not present

## 2020-09-23 DIAGNOSIS — E875 Hyperkalemia: Secondary | ICD-10-CM | POA: Diagnosis not present

## 2020-09-23 DIAGNOSIS — I129 Hypertensive chronic kidney disease with stage 1 through stage 4 chronic kidney disease, or unspecified chronic kidney disease: Secondary | ICD-10-CM | POA: Diagnosis not present

## 2020-09-23 DIAGNOSIS — E860 Dehydration: Secondary | ICD-10-CM | POA: Diagnosis not present

## 2020-09-23 DIAGNOSIS — E871 Hypo-osmolality and hyponatremia: Secondary | ICD-10-CM | POA: Diagnosis not present

## 2020-09-23 DIAGNOSIS — R531 Weakness: Secondary | ICD-10-CM | POA: Diagnosis not present

## 2020-09-24 DIAGNOSIS — N390 Urinary tract infection, site not specified: Secondary | ICD-10-CM | POA: Diagnosis not present

## 2020-09-24 DIAGNOSIS — E875 Hyperkalemia: Secondary | ICD-10-CM | POA: Diagnosis not present

## 2020-09-24 DIAGNOSIS — A419 Sepsis, unspecified organism: Secondary | ICD-10-CM | POA: Diagnosis not present

## 2020-09-24 DIAGNOSIS — N179 Acute kidney failure, unspecified: Secondary | ICD-10-CM | POA: Diagnosis not present

## 2020-09-25 DIAGNOSIS — N1339 Other hydronephrosis: Secondary | ICD-10-CM | POA: Diagnosis not present

## 2020-09-25 DIAGNOSIS — E875 Hyperkalemia: Secondary | ICD-10-CM | POA: Diagnosis not present

## 2020-09-25 DIAGNOSIS — E871 Hypo-osmolality and hyponatremia: Secondary | ICD-10-CM | POA: Diagnosis not present

## 2020-09-25 DIAGNOSIS — N390 Urinary tract infection, site not specified: Secondary | ICD-10-CM | POA: Diagnosis not present

## 2020-09-25 DIAGNOSIS — N179 Acute kidney failure, unspecified: Secondary | ICD-10-CM | POA: Diagnosis not present

## 2020-10-05 ENCOUNTER — Inpatient Hospital Stay (HOSPITAL_COMMUNITY)
Admission: EM | Admit: 2020-10-05 | Discharge: 2020-10-10 | DRG: 641 | Disposition: A | Discharge status: Home / Self Care | Payer: Medicare PPO | Attending: Internal Medicine | Admitting: Internal Medicine

## 2020-10-05 ENCOUNTER — Encounter (HOSPITAL_COMMUNITY): Payer: Self-pay

## 2020-10-05 ENCOUNTER — Other Ambulatory Visit: Payer: Self-pay

## 2020-10-05 ENCOUNTER — Emergency Department (HOSPITAL_COMMUNITY): Payer: Medicare PPO

## 2020-10-05 DIAGNOSIS — N179 Acute kidney failure, unspecified: Secondary | ICD-10-CM | POA: Diagnosis not present

## 2020-10-05 DIAGNOSIS — R9431 Abnormal electrocardiogram [ECG] [EKG]: Secondary | ICD-10-CM | POA: Diagnosis not present

## 2020-10-05 DIAGNOSIS — I129 Hypertensive chronic kidney disease with stage 1 through stage 4 chronic kidney disease, or unspecified chronic kidney disease: Secondary | ICD-10-CM | POA: Diagnosis not present

## 2020-10-05 DIAGNOSIS — N1832 Chronic kidney disease, stage 3b: Secondary | ICD-10-CM | POA: Diagnosis present

## 2020-10-05 DIAGNOSIS — Z66 Do not resuscitate: Secondary | ICD-10-CM | POA: Diagnosis present

## 2020-10-05 DIAGNOSIS — R112 Nausea with vomiting, unspecified: Secondary | ICD-10-CM | POA: Diagnosis not present

## 2020-10-05 DIAGNOSIS — N1330 Unspecified hydronephrosis: Secondary | ICD-10-CM | POA: Diagnosis present

## 2020-10-05 DIAGNOSIS — Z20822 Contact with and (suspected) exposure to covid-19: Secondary | ICD-10-CM | POA: Diagnosis present

## 2020-10-05 DIAGNOSIS — E861 Hypovolemia: Secondary | ICD-10-CM | POA: Diagnosis present

## 2020-10-05 DIAGNOSIS — A419 Sepsis, unspecified organism: Secondary | ICD-10-CM | POA: Diagnosis not present

## 2020-10-05 DIAGNOSIS — N390 Urinary tract infection, site not specified: Secondary | ICD-10-CM | POA: Diagnosis not present

## 2020-10-05 DIAGNOSIS — R0602 Shortness of breath: Secondary | ICD-10-CM | POA: Diagnosis not present

## 2020-10-05 DIAGNOSIS — D649 Anemia, unspecified: Secondary | ICD-10-CM | POA: Diagnosis not present

## 2020-10-05 DIAGNOSIS — D638 Anemia in other chronic diseases classified elsewhere: Secondary | ICD-10-CM | POA: Diagnosis not present

## 2020-10-05 DIAGNOSIS — N1339 Other hydronephrosis: Secondary | ICD-10-CM | POA: Diagnosis not present

## 2020-10-05 DIAGNOSIS — R351 Nocturia: Secondary | ICD-10-CM | POA: Diagnosis present

## 2020-10-05 DIAGNOSIS — E871 Hypo-osmolality and hyponatremia: Principal | ICD-10-CM | POA: Diagnosis present

## 2020-10-05 DIAGNOSIS — Z981 Arthrodesis status: Secondary | ICD-10-CM | POA: Diagnosis not present

## 2020-10-05 DIAGNOSIS — I10 Essential (primary) hypertension: Secondary | ICD-10-CM | POA: Diagnosis present

## 2020-10-05 DIAGNOSIS — Z85828 Personal history of other malignant neoplasm of skin: Secondary | ICD-10-CM | POA: Diagnosis not present

## 2020-10-05 DIAGNOSIS — I700 Atherosclerosis of aorta: Secondary | ICD-10-CM | POA: Diagnosis not present

## 2020-10-05 DIAGNOSIS — E870 Hyperosmolality and hypernatremia: Secondary | ICD-10-CM | POA: Diagnosis present

## 2020-10-05 DIAGNOSIS — Z87440 Personal history of urinary (tract) infections: Secondary | ICD-10-CM | POA: Diagnosis not present

## 2020-10-05 DIAGNOSIS — M109 Gout, unspecified: Secondary | ICD-10-CM | POA: Diagnosis not present

## 2020-10-05 DIAGNOSIS — E875 Hyperkalemia: Secondary | ICD-10-CM | POA: Diagnosis not present

## 2020-10-05 DIAGNOSIS — E785 Hyperlipidemia, unspecified: Secondary | ICD-10-CM | POA: Diagnosis present

## 2020-10-05 DIAGNOSIS — K219 Gastro-esophageal reflux disease without esophagitis: Secondary | ICD-10-CM | POA: Diagnosis present

## 2020-10-05 DIAGNOSIS — N19 Unspecified kidney failure: Secondary | ICD-10-CM

## 2020-10-05 DIAGNOSIS — K5909 Other constipation: Secondary | ICD-10-CM | POA: Diagnosis present

## 2020-10-05 DIAGNOSIS — N3010 Interstitial cystitis (chronic) without hematuria: Secondary | ICD-10-CM | POA: Diagnosis present

## 2020-10-05 DIAGNOSIS — N184 Chronic kidney disease, stage 4 (severe): Secondary | ICD-10-CM | POA: Diagnosis not present

## 2020-10-05 DIAGNOSIS — Z7189 Other specified counseling: Secondary | ICD-10-CM

## 2020-10-05 DIAGNOSIS — R609 Edema, unspecified: Secondary | ICD-10-CM | POA: Diagnosis not present

## 2020-10-05 DIAGNOSIS — Z1152 Encounter for screening for COVID-19: Secondary | ICD-10-CM | POA: Diagnosis not present

## 2020-10-05 LAB — BASIC METABOLIC PANEL
Anion gap: 9 (ref 5–15)
Anion gap: 6 (ref 5–15)
BUN: 31 mg/dL — ABNORMAL HIGH (ref 8–23)
BUN: 29 mg/dL — ABNORMAL HIGH (ref 8–23)
CO2: 21 mmol/L — ABNORMAL LOW (ref 22–32)
CO2: 23 mmol/L (ref 22–32)
Calcium: 8.7 mg/dL — ABNORMAL LOW (ref 8.9–10.3)
Calcium: 8.6 mg/dL — ABNORMAL LOW (ref 8.9–10.3)
Chloride: 78 mmol/L — ABNORMAL LOW (ref 98–111)
Chloride: 81 mmol/L — ABNORMAL LOW (ref 98–111)
Creatinine, Ser: 1.5 mg/dL — ABNORMAL HIGH (ref 0.44–1.00)
Creatinine, Ser: 1.38 mg/dL — ABNORMAL HIGH (ref 0.44–1.00)
GFR, Estimated: 34 mL/min — ABNORMAL LOW (ref >60)
GFR, Estimated: 37 mL/min — ABNORMAL LOW (ref >60)
Glucose, Bld: 114 mg/dL — ABNORMAL HIGH (ref 70–99)
Glucose, Bld: 97 mg/dL (ref 70–99)
Potassium: 5.5 mmol/L — ABNORMAL HIGH (ref 3.5–5.1)
Potassium: 4.5 mmol/L (ref 3.5–5.1)
Sodium: 108 mmol/L — CL (ref 135–145)
Sodium: 110 mmol/L — CL (ref 135–145)

## 2020-10-05 LAB — MRSA PCR SCREENING: MRSA by PCR: NEGATIVE

## 2020-10-05 LAB — CBC WITH DIFFERENTIAL/PLATELET
Abs Immature Granulocytes: 0.05 10*3/uL (ref 0.00–0.07)
Basophils Absolute: 0 10*3/uL (ref 0.0–0.1)
Basophils Relative: 0 %
Eosinophils Absolute: 0 10*3/uL (ref 0.0–0.5)
Eosinophils Relative: 0 %
HCT: 26.7 % — ABNORMAL LOW (ref 36.0–46.0)
Hemoglobin: 9.3 g/dL — ABNORMAL LOW (ref 12.0–15.0)
Immature Granulocytes: 1 %
Lymphocytes Relative: 23 %
Lymphs Abs: 2.3 10*3/uL (ref 0.7–4.0)
MCH: 30.7 pg (ref 26.0–34.0)
MCHC: 34.8 g/dL (ref 30.0–36.0)
MCV: 88.1 fL (ref 80.0–100.0)
Monocytes Absolute: 0.9 10*3/uL (ref 0.1–1.0)
Monocytes Relative: 9 %
Neutro Abs: 6.8 10*3/uL (ref 1.7–7.7)
Neutrophils Relative %: 67 %
Platelets: 500 10*3/uL — ABNORMAL HIGH (ref 150–400)
RBC: 3.03 MIL/uL — ABNORMAL LOW (ref 3.87–5.11)
RDW: 13.2 % (ref 11.5–15.5)
WBC: 10.1 10*3/uL (ref 4.0–10.5)
nRBC: 0 % (ref 0.0–0.2)

## 2020-10-05 LAB — SODIUM: Sodium: 109 mmol/L — CL (ref 135–145)

## 2020-10-05 LAB — OSMOLALITY
Osmolality: 239 mOsm/kg — CL (ref 275–295)
Osmolality: 243 mOsm/kg — CL (ref 275–295)

## 2020-10-05 LAB — TSH: TSH: 1.1 u[IU]/mL (ref 0.350–4.500)

## 2020-10-05 LAB — PHOSPHORUS: Phosphorus: 3.7 mg/dL (ref 2.5–4.6)

## 2020-10-05 LAB — MAGNESIUM: Magnesium: 1.7 mg/dL (ref 1.7–2.4)

## 2020-10-05 LAB — RESP PANEL BY RT-PCR (FLU A&B, COVID) ARPGX2
Influenza A by PCR: NEGATIVE
Influenza B by PCR: NEGATIVE
SARS Coronavirus 2 by RT PCR: NEGATIVE

## 2020-10-05 LAB — LIPID PANEL
Cholesterol: 115 mg/dL (ref 0–200)
HDL: 49 mg/dL (ref >40)
LDL Cholesterol: 57 mg/dL (ref 0–99)
Total CHOL/HDL Ratio: 2.3 RATIO
Triglycerides: 45 mg/dL (ref <150)
VLDL: 9 mg/dL (ref 0–40)

## 2020-10-05 LAB — CHLORIDE, URINE, RANDOM: Chloride Urine: 52 mmol/L

## 2020-10-05 LAB — CREATININE, URINE, RANDOM: Creatinine, Urine: 45.42 mg/dL

## 2020-10-05 LAB — CORTISOL: Cortisol, Plasma: 8.8 ug/dL

## 2020-10-05 LAB — SODIUM, URINE, RANDOM: Sodium, Ur: 50 mmol/L

## 2020-10-05 LAB — OSMOLALITY, URINE: Osmolality, Ur: 262 mOsm/kg — ABNORMAL LOW (ref 300–900)

## 2020-10-05 MED ORDER — CALCIUM GLUCONATE 10 % IV SOLN
1.0000 g | Freq: Once | INTRAVENOUS | Status: AC
  Administered 2020-10-05: 1 g via INTRAVENOUS
  Filled 2020-10-05: qty 10

## 2020-10-05 MED ORDER — CHLORHEXIDINE GLUCONATE 0.12 % MT SOLN
15.0000 mL | Freq: Two times a day (BID) | OROMUCOSAL | Status: DC
  Administered 2020-10-06 – 2020-10-10 (×8): 15 mL via OROMUCOSAL
  Filled 2020-10-05 (×8): qty 15

## 2020-10-05 MED ORDER — SODIUM CHLORIDE 3 % IV SOLN
INTRAVENOUS | Status: DC
  Filled 2020-10-05: qty 500

## 2020-10-05 MED ORDER — HEPARIN SODIUM (PORCINE) 5000 UNIT/ML IJ SOLN
5000.0000 [IU] | Freq: Three times a day (TID) | INTRAMUSCULAR | Status: DC
  Administered 2020-10-05 – 2020-10-10 (×15): 5000 [IU] via SUBCUTANEOUS
  Filled 2020-10-05 (×13): qty 1

## 2020-10-05 MED ORDER — ORAL CARE MOUTH RINSE
15.0000 mL | Freq: Two times a day (BID) | OROMUCOSAL | Status: DC
  Administered 2020-10-06 – 2020-10-10 (×9): 15 mL via OROMUCOSAL

## 2020-10-05 MED ORDER — SODIUM ZIRCONIUM CYCLOSILICATE 10 G PO PACK
10.0000 g | PACK | Freq: Once | ORAL | Status: AC
  Administered 2020-10-05: 10 g via ORAL
  Filled 2020-10-05: qty 1

## 2020-10-05 MED ORDER — POLYETHYLENE GLYCOL 3350 17 G PO PACK: 17.0000 g | PACK | Freq: Every day | ORAL | Status: DC | PRN

## 2020-10-05 MED ORDER — VITAMIN B-12 1000 MCG PO TABS
1000.0000 ug | ORAL_TABLET | Freq: Every day | ORAL | Status: DC
  Administered 2020-10-05 – 2020-10-10 (×6): 1000 ug via ORAL
  Filled 2020-10-05 (×7): qty 1

## 2020-10-05 MED ORDER — SODIUM CHLORIDE 0.9 % IV BOLUS
500.0000 mL | Freq: Once | INTRAVENOUS | Status: AC
  Administered 2020-10-05: 500 mL via INTRAVENOUS

## 2020-10-05 MED ORDER — CHLORHEXIDINE GLUCONATE CLOTH 2 % EX PADS
6.0000 | MEDICATED_PAD | Freq: Every day | CUTANEOUS | Status: DC
  Administered 2020-10-05 – 2020-10-09 (×5): 6 via TOPICAL

## 2020-10-05 MED ORDER — DOCUSATE SODIUM 100 MG PO CAPS: 100.0000 mg | ORAL_CAPSULE | Freq: Two times a day (BID) | ORAL | Status: DC | PRN

## 2020-10-05 NOTE — ED Triage Notes (Signed)
Patient was sent from her PCP office today for a Sodium level less than 109. Patient has been vomiting and having loose stools x 2 days.

## 2020-10-05 NOTE — Consult Note (Signed)
Reason for Consult: Hyponatremia Referring Physician:  Dr. Kathrynn Humble  Chief Complaint: Abnormal labs  Assessment/Plan: 1. Hyponatremia - unclear etiology but certainly nausea is a very strong stimulant of vasopressin release and is playing a role. Her lower extremity edema is a result of Amlodipine and unlikely to be from CHF. I would treat her as a euvolemic hyponatremia but hypovolemia cannot be ruled out. By history doesn't appear to be from poor solute intake which can cause a reset osmostat; she had  poor oral intake after the nausea appeared Sunday.  - Will send urine studies, urine and serum osmolality. - TSH, cortisol as well. - Will stop the NS in case this is a SIADH picture as she is already 108 and decr any further may induce seizures. - 3% NS in ICU setting with goal 4-6 increase in 1st 24hrs and 33meq in 1st 48hrs to decrease risk of ODS. - We may clamp with desmopressin but would 1st like to see an increase in SNa. - Water restrict to 1L / day. - Strict I&O's. 2. Hyperkalemia may be related to AKI +/- CKD - Medical therapy with Insulin, D50, Lokelma, HCO3.  3. AKI or CKD? - will need further trending to determine chronicity but will check urinalysis for any clues + renal ultrasound for a baseline renal size and any cortical thinning. Less likely to be ostruction. 4. HTN - ok to restart home medications. She was recently taken off Lisinopril likely because of AKI + hyperkalemia. 5. Gout - hold allopurinol for now.   HPI: Brenda [name] is an 87 y.o. female HTN, OA, recurrent UTI's who was recently admitted in [facility] with a UTI at which time she was found to have hyponatremia. When she went to her PCP for a follow up visit her labs revealed a sodium of 108. Of note she has had intermittent nausea and vomiting since Sunday. Currently she denies fever, chills, dysuria or hematuria.  Of note in the ED she was hypertensive and on laboratory exam hyperkalemic with a potassium of 5.5, Na of  108, HCO3 21 BUN/ Cr of 31/1.5 and anemic as well with a Hb of 9.3. On review her SNa has tended to be on the lower side in the low 130's since 2016. She also denies any NSAID use or anti-depressants or chemotherapeutic drugs.   ROS Pertinent items are noted in HPI.  Chemistry and CBC: Creatinine, Ser  Date/Time Value Ref Range Status  10/05/2020 01:35 PM 1.50 (H) 0.44 - 1.00 mg/dL Final  12/29/2015 07:37 AM 1.60 (H) 0.44 - 1.00 mg/dL Final  12/14/2014 05:17 AM 0.84 0.44 - 1.00 mg/dL Final  12/13/2014 05:10 AM 1.08 (H) 0.44 - 1.00 mg/dL Final  12/12/2014 05:40 PM 0.97 0.44 - 1.00 mg/dL Final  10/20/2014 12:59 PM 0.94 0.44 - 1.00 mg/dL Final  02/03/2011 07:15 AM 0.85 0.50 - 1.10 mg/dL Final  01/30/2011 06:14 AM 0.91 0.50 - 1.10 mg/dL Final  01/29/2011 05:25 AM 0.93 0.50 - 1.10 mg/dL Final  01/27/2011 10:48 AM 0.87 0.50 - 1.10 mg/dL Final   Recent Labs  Lab 10/05/20 1335  NA 108*  K 5.5*  CL 78*  CO2 21*  GLUCOSE 114*  BUN 31*  CREATININE 1.50*  CALCIUM 8.7*  PHOS 3.7   Recent Labs  Lab 10/05/20 1335  WBC 10.1  NEUTROABS 6.8  HGB 9.3*  HCT 26.7*  MCV 88.1  PLT 500*   Liver Function Tests: No results for input(s): AST, ALT, ALKPHOS, BILITOT, PROT, ALBUMIN in  the last 168 hours. No results for input(s): LIPASE, AMYLASE in the last 168 hours. No results for input(s): AMMONIA in the last 168 hours. Cardiac Enzymes: No results for input(s): CKTOTAL, CKMB, CKMBINDEX, TROPONINI in the last 168 hours. Iron Studies: No results for input(s): IRON, TIBC, TRANSFERRIN, FERRITIN in the last 72 hours. PT/INR: @LABRCNTIP (inr:5)  Xrays/Other Studies: ) Results for orders placed or performed during the hospital encounter of 10/05/20 (from the past 48 hour(s))  Basic metabolic panel     Status: Abnormal   Collection Time: 10/05/20  1:35 PM  Result Value Ref Range   Sodium 108 (LL) 135 - 145 mmol/L    Comment: REPEATED TO VERIFY CRITICAL RESULT CALLED TO, READ BACK BY AND  VERIFIED WITH: M.QUICK, RN AT 1512 ON 05.24.22 BY N.THOMPSON    Potassium 5.5 (H) 3.5 - 5.1 mmol/L   Chloride 78 (L) 98 - 111 mmol/L    Comment: REPEATED TO VERIFY   CO2 21 (L) 22 - 32 mmol/L   Glucose, Bld 114 (H) 70 - 99 mg/dL    Comment: Glucose reference range applies only to samples taken after fasting for at least 8 hours.   BUN 31 (H) 8 - 23 mg/dL   Creatinine, Ser 1.50 (H) 0.44 - 1.00 mg/dL   Calcium 8.7 (L) 8.9 - 10.3 mg/dL   GFR, Estimated 34 (L) >60 mL/min    Comment: (NOTE) Calculated using the CKD-EPI Creatinine Equation (2021)    Anion gap 9 5 - 15    Comment: Performed at [facility], [place] [address], [place], [place] [address]  CBC with Differential     Status: Abnormal   Collection Time: 10/05/20  1:35 PM  Result Value Ref Range   WBC 10.1 4.0 - 10.5 K/uL   RBC 3.03 (L) 3.87 - 5.11 MIL/uL   Hemoglobin 9.3 (L) 12.0 - 15.0 g/dL   HCT 26.7 (L) 36.0 - 46.0 %   MCV 88.1 80.0 - 100.0 fL   MCH 30.7 26.0 - 34.0 pg   MCHC 34.8 30.0 - 36.0 g/dL   RDW 13.2 11.5 - 15.5 %   Platelets 500 (H) 150 - 400 K/uL   nRBC 0.0 0.0 - 0.2 %   Neutrophils Relative % 67 %   Neutro Abs 6.8 1.7 - 7.7 K/uL   Lymphocytes Relative 23 %   Lymphs Abs 2.3 0.7 - 4.0 K/uL   Monocytes Relative 9 %   Monocytes Absolute 0.9 0.1 - 1.0 K/uL   Eosinophils Relative 0 %   Eosinophils Absolute 0.0 0.0 - 0.5 K/uL   Basophils Relative 0 %   Basophils Absolute 0.0 0.0 - 0.1 K/uL   Immature Granulocytes 1 %   Abs Immature Granulocytes 0.05 0.00 - 0.07 K/uL    Comment: Performed at [facility], [place] [address], [place], [place] [address]  Magnesium     Status: None   Collection Time: 10/05/20  1:35 PM  Result Value Ref Range   Magnesium 1.7 1.7 - 2.4 mg/dL    Comment: Performed at [facility], [place] [address] [place], [place] [address]  Phosphorus     Status: None   Collection Time: 10/05/20  1:35 PM  Result Value Ref Range   Phosphorus 3.7  2.5 - 4.6 mg/dL    Comment: Performed at [facility], [place] [address] [place], [place] [address]  Resp Panel by RT-PCR (Flu A&B, Covid) Nasopharyngeal Swab     Status: None  Collection Time: 10/05/20  2:34 PM   Specimen: Nasopharyngeal Swab; Nasopharyngeal(NP) swabs in vial transport medium  Result Value Ref Range   SARS Coronavirus 2 by RT PCR NEGATIVE NEGATIVE    Comment: (NOTE) SARS-CoV-2 target nucleic acids are NOT DETECTED.  The SARS-CoV-2 RNA is generally detectable in upper respiratory specimens during the acute phase of infection. The lowest concentration of SARS-CoV-2 viral copies this assay can detect is 138 copies/mL. A negative result does not preclude SARS-Cov-2 infection and should not be used as the sole basis for treatment or other patient management decisions. A negative result may occur with  improper specimen collection/handling, submission of specimen other than nasopharyngeal swab, presence of viral mutation(s) within the areas targeted by this assay, and inadequate number of viral copies(<138 copies/mL). A negative result must be combined with clinical observations, patient history, and epidemiological information. The expected result is Negative.  Fact Sheet for Patients:  EntrepreneurPulse.com.au  Fact Sheet for Healthcare Providers:  IncredibleEmployment.be  This test is no t yet approved or cleared by the [place] FDA and  has been authorized for detection and/or diagnosis of SARS-CoV-2 by FDA under an Emergency Use Authorization (EUA). This EUA will remain  in effect (meaning this test can be used) for the duration of the COVID-19 declaration under Section 564(b)(1) of the Act, 21 U.S.C.section 360bbb-3(b)(1), unless the authorization is terminated  or revoked sooner.       Influenza A by PCR NEGATIVE NEGATIVE   Influenza B by PCR NEGATIVE NEGATIVE    Comment: (NOTE) The Xpert Xpress  SARS-CoV-2/FLU/RSV plus assay is intended as an aid in the diagnosis of influenza from Nasopharyngeal swab specimens and should not be used as a sole basis for treatment. Nasal washings and aspirates are unacceptable for Xpert Xpress SARS-CoV-2/FLU/RSV testing.  Fact Sheet for Patients: EntrepreneurPulse.com.au  Fact Sheet for Healthcare Providers: IncredibleEmployment.be  This test is not yet approved or cleared by the [place] FDA and has been authorized for detection and/or diagnosis of SARS-CoV-2 by FDA under an Emergency Use Authorization (EUA). This EUA will remain in effect (meaning this test can be used) for the duration of the COVID-19 declaration under Section 564(b)(1) of the Act, 21 U.S.C. section 360bbb-3(b)(1), unless the authorization is terminated or revoked.  Performed at [facility], [place] [address] [place], [place] [address]    No results found.  PMH:   Past Medical History:  Diagnosis Date  . Erythematous bladder mucosa   . Frequent urination   . Gait instability    uses walker  . GERD (gastroesophageal reflux disease)   . History of basal cell carcinoma excision    yrs ago-  nose  . History of gout    last bout yrs ago  . History of recurrent UTIs   . Hydronephrosis   . Hyperlipidemia   . Hypertension   . Nocturia   . Numbness and tingling of both legs    residual  from spinal stenosis and tumor  s/p  back surgery's  uses walker  . OA (osteoarthritis)   . Urge urinary incontinence   . Wears glasses     PSH:   Past Surgical History:  Procedure Laterality Date  . CARPAL TUNNEL RELEASE Right early 2000's  . CATARACT EXTRACTION W/ INTRAOCULAR LENS IMPLANT Right yrs ago  . CERVICAL FUSION  1999   C5 -- C6  . CYSTOSCOPY W/ RETROGRADES Right 12/29/2015   Procedure: CYSTOSCOPY WITH RETROGRADE PYELOGRAM;  Surgeon: Alexis Frock, MD;  Location: [place];  Service:  Urology;  Laterality: Right;  . CYSTOSCOPY WITH BIOPSY  12/29/2015   Procedure: CYSTOSCOPY WITH BIOPSY;  Surgeon: Alexis Frock, MD;  Location: [facility];  Service: Urology;;  . [place] N/A 12/29/2015   Procedure: CYSTOSCOPY WITH FULGERATION;  Surgeon: Alexis Frock, MD;  Location: [facility];  Service: Urology;  Laterality: N/A;  . KNEE ARTHROSCOPY Right 2010  approx  . POSTERIOR LAMINECTOMY / DECOMPRESSION LUMBAR SPINE  10/28/2014   lam.  L2 -- L4/  decompression L2 -- L5/  fusion L4-5  . THORACIC LAMINECTOMY  01/30/2011   lam. T9 -- T11 /  Resection Intraspinal extradural extramedullary tumor    Allergies: No Known Allergies  Medications:   Prior to Admission medications   Medication Sig Start Date End Date Taking? Authorizing Provider  acetaminophen (TYLENOL) 500 MG tablet Take 1,000 mg by mouth every 6 (six) hours as needed for moderate pain.    Yes [provider]  allopurinol (ZYLOPRIM) 300 MG tablet Take 150 mg by mouth daily. 08/15/20  Yes [provider]  amLODipine (NORVASC) 5 MG tablet Take 5 mg by mouth daily. 09/26/20  Yes [provider]  Cyanocobalamin (VITAMIN B12) 1000 MCG TBCR Take 1,000 mcg by mouth daily.   Yes [provider]  diclofenac Sodium (VOLTAREN) 1 % GEL APPLY 2 GRAMS TO AFFECTED AREA 4 TIMES A DAY Patient taking differently: Apply 2 g topically 2 (two) times daily as needed (pain). 01/05/20  Yes Leandrew Koyanagi, MD  Multiple Vitamin (MULTIVITAMIN) tablet Take 1 tablet by mouth daily.   Yes [provider]  OVER THE COUNTER MEDICATION Take 1 tablet by mouth daily. Vit D 3 125 mcg/ 100 mcg Vit K w Coconut oil   Yes [provider]  Probiotic Product ([place]) CAPS Take 1 capsule by mouth daily.   Yes [provider]  zinc gluconate 50 MG tablet Take 25 mg by mouth daily.   Yes [provider]    Discontinued Meds:    Medications Discontinued During This Encounter  Medication Reason  . fish oil-omega-3 fatty acids 1000 MG capsule No longer needed (for PRN medications)  . glucosamine-chondroitin 500-400 MG tablet No longer needed (for PRN medications)  . lisinopril (PRINIVIL,ZESTRIL) 20 MG tablet Discontinued by provider  . meloxicam (MOBIC) 7.5 MG tablet Completed Course  . Naproxen Sodium 220 MG CAPS Patient Preference  . OVER THE COUNTER MEDICATION No longer needed (for PRN medications)  . polyethylene glycol (MIRALAX / GLYCOLAX) packet Patient Preference  . ranitidine (ZANTAC) 150 MG tablet No longer needed (for PRN medications)  . sennosides-docusate sodium (SENOKOT-S) 8.6-50 MG tablet No longer needed (for PRN medications)  . traMADol (ULTRAM) 50 MG tablet Completed Course  . Trolamine Salicylate (ASPERCREME EX) No longer needed (for PRN medications)    Social History:  reports that she has never smoked. She has never used smokeless tobacco. She reports that she does not drink alcohol and does not use drugs.  Family History:   Family History  Problem Relation Age of Onset  . Arthritis Mother   . Cancer Father     Blood pressure (!) 153/83, pulse 71, temperature 97.7 F (36.5 C), temperature source Oral, resp. rate (!) 21, height 5\' 3"  (1.6 m), weight 60.3 kg, SpO2 99 %. General appearance: alert, cooperative and appears stated age Head: Normocephalic, without obvious abnormality, atraumatic Eyes: negative Neck: no adenopathy, no carotid bruit, no  JVD, supple, symmetrical, trachea midline and thyroid not enlarged, symmetric, no tenderness/mass/nodules Back: symmetric, no curvature. ROM normal. No CVA tenderness. Chest wall: no tenderness Cardio: regular rate and rhythm GI: soft, non-tender; bowel sounds normal; no masses,  no organomegaly Extremities: edema trace Pulses: 2+ and symmetric Skin: Skin color, texture, turgor normal. No rashes or lesions       Davidmichael Zarazua, Hunt Oris,  MD 10/05/2020, 3:55 PM

## 2020-10-05 NOTE — ED Notes (Signed)
Critical value reported to Dr. Kathrynn Humble. Sodium of 108

## 2020-10-05 NOTE — H&P (Signed)
NAME:  Brenda [name], MRN:  [id number], DOB:  November 16, 1930, LOS: 0 ADMISSION DATE:  10/05/2020, CONSULTATION DATE:  10/05/2020 REFERRING MD:  Dr. Roderic Palau, CHIEF COMPLAINT:  Hyponatremia   History of Present Illness:   Patient is a 87 yo female with PMH chronic hyponatremia (baseline NA 127), HTN, HLD, OA, hydronephrosis, and recurrent UTI's of  presenting to [facility] on 10/05/2020 with 2-3 d h/o N&V, mild LE edema and lab work identified by PCP showing severe hyponatremia .  Patient present to ED at [facility] on 10/05/2020 after going to a PCP appointment showing hyponatremia. She recently was discharged on 09/28/2020 from a hospital in [place] with a UTI and low sodium. Discharged on amlodipine for HTN. Patient states she has had N/V and loose stools the past 2 days with decreased oral intake. Denies burning/bloody with urination. No other medication changes.  ED course labs show: Na 108, K 5.5, Chloride 78, BUN 31, Creatinine 1.5, GFR 34, Hgb 9.3. Patient also has hypertension. Bilateral lower edema present.  PCCM consulted for hyponatremia (Na 108). Patient is stable without encephalopathy. Will admit to ICU to start on 3% Hypertonic Saline to monitor.  Pertinent  Medical History   Past Medical History:  Diagnosis Date  . Erythematous bladder mucosa   . Frequent urination   . Gait instability    uses walker  . GERD (gastroesophageal reflux disease)   . History of basal cell carcinoma excision    yrs ago-  nose  . History of gout    last bout yrs ago  . History of recurrent UTIs   . Hydronephrosis   . Hyperlipidemia   . Hypertension   . Nocturia   . Numbness and tingling of both legs    residual  from spinal stenosis and tumor  s/p  back surgery's  uses walker  . OA (osteoarthritis)   . Urge urinary incontinence   . Wears glasses      Significant Hospital Events: Including procedures, antibiotic start and stop dates in addition to other pertinent events   . 5/24: patient admitted to [facility]  for hyponatremia. Hypertonic saline given  Interim History / Subjective:   Patient has no complaints. Nausea/vomiting improving and was able to eat some earlier today.  Objective   Blood pressure (!) 173/68, pulse 87, temperature 97.7 F (36.5 C), temperature source Oral, resp. rate (!) 23, height 5\' 3"  (1.6 m), weight 60.3 kg, SpO2 98 %.       No intake or output data in the 24 hours ending 10/05/20 1647 Filed Weights   10/05/20 1314  Weight: 60.3 kg    Examination: General:  NAD HEENT: MM pink/moist Neuro: AOX4 CV: s1s2, no m/r/g, mild JVD present w/ hepatic compression PULM: dim clear BS bilaterally; on room air GI: soft, bsx4 active  Extremities: warm/dry, bilateral 1+ edema up to ankle Skin: no rashes or lesions; normal skin turgor   Labs/imaging that I havepersonally reviewed  (right click and "Reselect all SmartList Selections" daily)   Na 108, K 5.5, Chloride 78 BUN 31, Creatinine 1.5, GFR 34 Hgb 9.3  Resolved Hospital Problem list     Assessment & Plan:   Acute on chronic symptomatic hyporosmolar hyponatremia: likely euvolemic but possibly hypovolemic Plan: - Nephrology following - Urine studies: Urine sodium/osmolality/creatinine sent - Ordered TSH, Cortisol - NS stopped until SIADH ruled out - Will start 3% NS in ICU with goal 4-6 increase in 1st 24hrs and 10 meq in 1st 48 hrs to decrease risk  of ODS - Consider desmopressin per nephrology - Restrict water to 1L/day - Trend BMP/UO  AKI Hyperkalemia Plan: -IV fluids -trend BMP/UO -Avoid nephrotoxic agents/renal dose meds -Renal US ordered per nephrology -Lokelma given  Anemia Plan: - Trend CBC - Transfuse for hgb < 7  Hx of HTN Plan: -continue home amlodipine  Hx of Gout Plan: -Allopurinol on hold  Best practice (right click and "Reselect all SmartList Selections" daily)  Diet:  Oral Pain/Anxiety/Delirium protocol (if indicated): No VAP protocol (if indicated): Not indicated DVT  prophylaxis: Subcutaneous Heparin GI prophylaxis: N/A Glucose control:  SSI No Central venous access:  N/A Arterial line:  N/A Foley:  N/A Mobility:  bed rest  PT consulted: N/A Last date of multidisciplinary goals of care discussion []  Code Status:  full code Disposition: ICU  Labs   CBC: Recent Labs  Lab 10/05/20 1335  WBC 10.1  NEUTROABS 6.8  HGB 9.3*  HCT 26.7*  MCV 88.1  PLT 500*    Basic Metabolic Panel: Recent Labs  Lab 10/05/20 1335  NA 108*  K 5.5*  CL 78*  CO2 21*  GLUCOSE 114*  BUN 31*  CREATININE 1.50*  CALCIUM 8.7*  MG 1.7  PHOS 3.7   GFR: Estimated Creatinine Clearance: 21.9 mL/min (A) (by C-G formula based on SCr of 1.5 mg/dL (H)). Recent Labs  Lab 10/05/20 1335  WBC 10.1    Liver Function Tests: No results for input(s): AST, ALT, ALKPHOS, BILITOT, PROT, ALBUMIN in the last 168 hours. No results for input(s): LIPASE, AMYLASE in the last 168 hours. No results for input(s): AMMONIA in the last 168 hours.  ABG    Component Value Date/Time   TCO2 23 12/29/2015 0737     Coagulation Profile: No results for input(s): INR, PROTIME in the last 168 hours.  Cardiac Enzymes: No results for input(s): CKTOTAL, CKMB, CKMBINDEX, TROPONINI in the last 168 hours.  HbA1C: Hgb A1c MFr Bld  Date/Time Value Ref Range Status  01/26/2011 10:31 PM 5.8 (H) <5.7 % Final    Comment:    (NOTE)                                                                       According to the ADA Clinical Practice Recommendations for 2011, when HbA1c is used as a screening test:  >=6.5%   Diagnostic of Diabetes Mellitus           (if abnormal result is confirmed) 5.7-6.4%   Increased risk of developing Diabetes Mellitus References:Diagnosis and Classification of Diabetes Mellitus,Diabetes QJJH,4174,08(XKGYJ 1):S62-S69 and Standards of Medical Care in         Diabetes - 2011,Diabetes EHUD,1497,02 (Suppl 1):S11-S61.    CBG: No results for input(s): GLUCAP in the  last 168 hours.  Review of Systems:   General:  Denies fever HEENT: Denies dry mouth Neuro: denies confusion CV: denies chest pain; positive for LE swelling PULM:  Denies SOB GI: Positive N/V and loose stools; Denies abdominal pain  Past Medical History:  She,  has a past medical history of Erythematous bladder mucosa, Frequent urination, Gait instability, GERD (gastroesophageal reflux disease), History of basal cell carcinoma excision, History of gout, History of recurrent UTIs, Hydronephrosis, Hyperlipidemia, Hypertension, Nocturia, Numbness and tingling of  both legs, OA (osteoarthritis), Urge urinary incontinence, and Wears glasses.   Surgical History:   Past Surgical History:  Procedure Laterality Date  . CARPAL TUNNEL RELEASE Right early 2000's  . CATARACT EXTRACTION W/ INTRAOCULAR LENS IMPLANT Right yrs ago  . CERVICAL FUSION  1999   C5 -- C6  . CYSTOSCOPY W/ RETROGRADES Right 12/29/2015   Procedure: CYSTOSCOPY WITH RETROGRADE PYELOGRAM;  Surgeon: Alexis Frock, MD;  Location: [facility];  Service: Urology;  Laterality: Right;  . CYSTOSCOPY WITH BIOPSY  12/29/2015   Procedure: CYSTOSCOPY WITH BIOPSY;  Surgeon: Alexis Frock, MD;  Location: [facility];  Service: Urology;;  . [place] N/A 12/29/2015   Procedure: CYSTOSCOPY WITH FULGERATION;  Surgeon: Alexis Frock, MD;  Location: [facility];  Service: Urology;  Laterality: N/A;  . KNEE ARTHROSCOPY Right 2010  approx  . POSTERIOR LAMINECTOMY / DECOMPRESSION LUMBAR SPINE  10/28/2014   lam.  L2 -- L4/  decompression L2 -- L5/  fusion L4-5  . THORACIC LAMINECTOMY  01/30/2011   lam. T9 -- T11 /  Resection Intraspinal extradural extramedullary tumor     Social History:   reports that she has never smoked. She has never used smokeless tobacco. She reports that she does not drink alcohol and does not use drugs.   Family History:  Her family history includes Arthritis  in her mother; Cancer in her father.   Allergies No Known Allergies   Home Medications  Prior to Admission medications   Medication Sig Start Date End Date Taking? Authorizing Provider  acetaminophen (TYLENOL) 500 MG tablet Take 1,000 mg by mouth every 6 (six) hours as needed for moderate pain.    Yes [provider]  allopurinol (ZYLOPRIM) 300 MG tablet Take 150 mg by mouth daily. 08/15/20  Yes [provider]  amLODipine (NORVASC) 5 MG tablet Take 5 mg by mouth daily. 09/26/20  Yes [provider]  Cyanocobalamin (VITAMIN B12) 1000 MCG TBCR Take 1,000 mcg by mouth daily.   Yes [provider]  diclofenac Sodium (VOLTAREN) 1 % GEL APPLY 2 GRAMS TO AFFECTED AREA 4 TIMES A DAY Patient taking differently: Apply 2 g topically 2 (two) times daily as needed (pain). 01/05/20  Yes Leandrew Koyanagi, MD  Multiple Vitamin (MULTIVITAMIN) tablet Take 1 tablet by mouth daily.   Yes [provider]  OVER THE COUNTER MEDICATION Take 1 tablet by mouth daily. Vit D 3 125 mcg/ 100 mcg Vit K w Coconut oil   Yes [provider]  Probiotic Product ([place]) CAPS Take 1 capsule by mouth daily.   Yes [provider]  zinc gluconate 50 MG tablet Take 25 mg by mouth daily.   Yes [provider]     Critical care time: [place], PA-C [facility] 10/05/2020, 4:48 PM  Please see [email or web address] for pager details.  From 7A-7P if no response, please call [phone]. After hours, please call ELink [phone].'

## 2020-10-05 NOTE — ED Notes (Signed)
Dr. Nanavati at bedside 

## 2020-10-05 NOTE — ED Notes (Signed)
Brenda [name] has been sent a secure message in regards to admission bed

## 2020-10-05 NOTE — ED Provider Notes (Signed)
[facility] DEPT Provider Note   CSN: [id number] Arrival date & time: 10/05/20  1229     History Chief Complaint  Patient presents with  . Abnormal Lab    Brenda [name] is a 87 y.o. female.  HPI     87 year old female comes in a chief complaint of abnormal labs. Patient is active and lives at her home.  She does not have significant medical history.  Unfortunately, she did have UTI and was admitted to hospital in [place] earlier in the month.  During that time they also noted that she had low sodium.  Today she had gone to her PCP for a follow-up visit, and her labs revealed sodium of 108.  Patient reports that on Sunday she started having some nausea and has been vomiting 2 or 3 times a day since then.  During the time of admission at outside hospital, patient did have fever, confusion which does not exist at this time.  Patient is denying any burning with urination, blood in the urine.  Besides the vomiting, her p.o. intake has otherwise been fine.  No new medications.  No known history of active cancer.  Past Medical History:  Diagnosis Date  . Erythematous bladder mucosa   . Frequent urination   . Gait instability    uses walker  . GERD (gastroesophageal reflux disease)   . History of basal cell carcinoma excision    yrs ago-  nose  . History of gout    last bout yrs ago  . History of recurrent UTIs   . Hydronephrosis   . Hyperlipidemia   . Hypertension   . Nocturia   . Numbness and tingling of both legs    residual  from spinal stenosis and tumor  s/p  back surgery's  uses walker  . OA (osteoarthritis)   . Urge urinary incontinence   . Wears glasses     Patient Active Problem List   Diagnosis Date Noted  . Gross hematuria 12/29/2015  . Sepsis secondary to UTI ([place]) 12/12/2014  . Essential hypertension 12/12/2014  . Spondylolisthesis of lumbar region 10/28/2014  . T10 spinal cord injury ([place]) 10/10/2011  . 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 CARPAL TUNNEL RELEASE Right early 2000's  . CATARACT EXTRACTION W/ INTRAOCULAR LENS IMPLANT Right yrs ago  . CERVICAL FUSION  1999   C5 -- C6  . CYSTOSCOPY W/ RETROGRADES Right 12/29/2015   Procedure: CYSTOSCOPY WITH RETROGRADE PYELOGRAM;  Surgeon: Alexis Frock, MD;  Location: [facility];  Service: Urology;  Laterality: Right;  . CYSTOSCOPY WITH BIOPSY  12/29/2015   Procedure: CYSTOSCOPY WITH BIOPSY;  Surgeon: Alexis Frock, MD;  Location: [facility];  Service: Urology;;  . [place] N/A 12/29/2015   Procedure: CYSTOSCOPY WITH FULGERATION;  Surgeon: Alexis Frock, MD;  Location: [facility];  Service: Urology;  Laterality: N/A;  . KNEE ARTHROSCOPY Right 2010  approx  . POSTERIOR LAMINECTOMY / DECOMPRESSION LUMBAR SPINE  10/28/2014   lam.  L2 -- L4/  decompression L2 -- L5/  fusion L4-5  . THORACIC LAMINECTOMY  01/30/2011   lam. T9 -- T11 /  Resection Intraspinal extradural extramedullary tumor     OB History   No obstetric history on file.     Family History  Problem Relation Age of Onset  . Arthritis Mother   . Cancer Father     Social History  Tobacco Use  . Smoking status: Never Smoker  . Smokeless tobacco: Never Used  Vaping Use  . Vaping Use: Never used  Substance Use Topics  . Alcohol use: No  . Drug use: No    Home Medications Prior to Admission medications   Medication Sig Start Date End Date Taking? Authorizing Provider  acetaminophen (TYLENOL) 500 MG tablet Take 1,000 mg by mouth every 6 (six) hours as needed for moderate pain.    Yes [provider]  allopurinol (ZYLOPRIM) 300 MG tablet Take 150 mg by mouth daily. 08/15/20  Yes [provider]  amLODipine (NORVASC) 5 MG tablet Take 5 mg by mouth daily. 09/26/20  Yes [provider]  Cyanocobalamin (VITAMIN B12) 1000 MCG TBCR Take 1,000 mcg by mouth  daily.   Yes [provider]  diclofenac Sodium (VOLTAREN) 1 % GEL APPLY 2 GRAMS TO AFFECTED AREA 4 TIMES A DAY Patient taking differently: Apply 2 g topically 2 (two) times daily as needed (pain). 01/05/20  Yes Leandrew Koyanagi, MD  Multiple Vitamin (MULTIVITAMIN) tablet Take 1 tablet by mouth daily.   Yes [provider]  OVER THE COUNTER MEDICATION Take 1 tablet by mouth daily. Vit D 3 125 mcg/ 100 mcg Vit K w Coconut oil   Yes [provider]  Probiotic Product ([place]) CAPS Take 1 capsule by mouth daily.   Yes [provider]  zinc gluconate 50 MG tablet Take 25 mg by mouth daily.   Yes [provider]    Allergies    Patient has no known allergies.  Review of Systems   Review of Systems  Constitutional: Positive for activity change.  Respiratory: Negative for shortness of breath.   Cardiovascular: Negative for chest pain.  Gastrointestinal: Positive for nausea and vomiting.  Neurological: Negative for dizziness.  All other systems reviewed and are negative.   Physical Exam Updated Vital Signs BP (!) 153/83   Pulse 71   Temp 97.7 F (36.5 C) (Oral)   Resp (!) 21   Ht 5\' 3"  (1.6 m)   Wt 60.3 kg   SpO2 99%   BMI 23.56 kg/m   Physical Exam Vitals and nursing note reviewed.  Constitutional:      Appearance: She is well-developed.  HENT:     Head: Normocephalic and atraumatic.     Mouth/Throat:     Mouth: Mucous membranes are moist.  Eyes:     Extraocular Movements: Extraocular movements intact.     Pupils: Pupils are equal, round, and reactive to light.  Cardiovascular:     Rate and Rhythm: Normal rate.  Pulmonary:     Effort: Pulmonary effort is normal.  Abdominal:     General: Bowel sounds are normal.  Musculoskeletal:     Cervical back: Normal range of motion and neck supple.  Skin:    General: Skin is warm and dry.  Neurological:     Mental Status: She is alert and oriented to person, place, and  time.     ED Results / Procedures / Treatments   Labs (all labs ordered are listed, but only abnormal results are displayed) Labs Reviewed  BASIC METABOLIC PANEL - Abnormal; Notable for the following components:      Result Value   Sodium 108 (*)    Potassium 5.5 (*)    Chloride 78 (*)    CO2 21 (*)    Glucose, Bld 114 (*)    BUN 31 (*)    Creatinine,  Ser 1.50 (*)    Calcium 8.7 (*)    GFR, Estimated 34 (*)    All other components within normal limits  CBC WITH DIFFERENTIAL/PLATELET - Abnormal; Notable for the following components:   RBC 3.03 (*)    Hemoglobin 9.3 (*)    HCT 26.7 (*)    Platelets 500 (*)    All other components within normal limits  RESP PANEL BY RT-PCR (FLU A&B, COVID) ARPGX2  MAGNESIUM  PHOSPHORUS  OSMOLALITY  OSMOLALITY, URINE    EKG EKG Interpretation  Date/Time:  Tuesday Oct 05 2020 13:48:37 EDT Ventricular Rate:  66 PR Interval:  143 QRS Duration: 93 QT Interval:  386 QTC Calculation: 405 R Axis:   66 Text Interpretation: Sinus rhythm Artifact in lead(s) I II III aVR aVL aVF V1 V2 No acute changes No significant change since last tracing Confirmed by Varney Biles [id number]) on 10/05/2020 2:30:30 PM   Radiology No results found.  Procedures .Critical Care Performed by: Varney Biles, MD Authorized by: Varney Biles, MD   Critical care provider statement:    Critical care time (minutes):  42   Critical care was necessary to treat or prevent imminent or life-threatening deterioration of the following conditions:  Metabolic crisis   Critical care was time spent personally by me on the following activities:  Discussions with consultants, evaluation of patient's response to treatment, examination of patient, ordering and performing treatments and interventions, ordering and review of laboratory studies, ordering and review of radiographic studies, pulse oximetry, re-evaluation of patient's condition, obtaining history from patient or  surrogate and review of old charts     Medications Ordered in ED Medications  sodium chloride 0.9 % bolus 500 mL (has no administration in time range)  calcium gluconate inj 10% (1 g) URGENT USE ONLY! (has no administration in time range)    ED Course  I have reviewed the triage vital signs and the nursing notes.  Pertinent labs & imaging results that were available during my care of the patient were reviewed by me and considered in my medical decision making (see chart for details).    MDM Rules/Calculators/A&P                          87 year old female comes in with chief complaint of abnormal labs.  She is noted to have sodium of 108.  History is not clearly indicative of any specific source.  No new medications, no cancer history.  Patient did have recent admission to the hospital for sepsis and was diagnosed with hyponatremia with it.  Her sodium corrected to 130 at the time of discharge.  Currently she is having nausea and vomiting, which I think is because of her hyponatremia.  I ordered serum awesome, urine awesome.  Patient has mild hyper-K.  EKG is not showing any acute changes.  We will give her low volume of fluid bolus with normal saline and also calcium gluconate.  I have discussed the case with nephrology staff.  They want patient to get 3% normal saline.  Have consulted ICU team for admission.  Final Clinical Impression(s) / ED Diagnoses Final diagnoses:  Acute hyponatremia    Rx / DC Orders ED Discharge Orders    None       Varney Biles, MD 10/05/20 1540

## 2020-10-05 NOTE — ED Notes (Signed)
Dr Shearon Stalls was sent a secure message regarding critical serum osm. of 239

## 2020-10-06 ENCOUNTER — Inpatient Hospital Stay (HOSPITAL_COMMUNITY): Payer: Medicare PPO

## 2020-10-06 DIAGNOSIS — E871 Hypo-osmolality and hyponatremia: Secondary | ICD-10-CM | POA: Diagnosis not present

## 2020-10-06 DIAGNOSIS — R0602 Shortness of breath: Secondary | ICD-10-CM

## 2020-10-06 LAB — BASIC METABOLIC PANEL
Anion gap: 7 (ref 5–15)
Anion gap: 8 (ref 5–15)
Anion gap: 7 (ref 5–15)
Anion gap: 6 (ref 5–15)
Anion gap: 8 (ref 5–15)
BUN: 31 mg/dL — ABNORMAL HIGH (ref 8–23)
BUN: 29 mg/dL — ABNORMAL HIGH (ref 8–23)
BUN: 29 mg/dL — ABNORMAL HIGH (ref 8–23)
BUN: 30 mg/dL — ABNORMAL HIGH (ref 8–23)
BUN: 31 mg/dL — ABNORMAL HIGH (ref 8–23)
CO2: 22 mmol/L (ref 22–32)
CO2: 22 mmol/L (ref 22–32)
CO2: 24 mmol/L (ref 22–32)
CO2: 25 mmol/L (ref 22–32)
CO2: 23 mmol/L (ref 22–32)
Calcium: 8.4 mg/dL — ABNORMAL LOW (ref 8.9–10.3)
Calcium: 8.4 mg/dL — ABNORMAL LOW (ref 8.9–10.3)
Calcium: 8.4 mg/dL — ABNORMAL LOW (ref 8.9–10.3)
Calcium: 8.3 mg/dL — ABNORMAL LOW (ref 8.9–10.3)
Calcium: 8.3 mg/dL — ABNORMAL LOW (ref 8.9–10.3)
Chloride: 88 mmol/L — ABNORMAL LOW (ref 98–111)
Chloride: 83 mmol/L — ABNORMAL LOW (ref 98–111)
Chloride: 82 mmol/L — ABNORMAL LOW (ref 98–111)
Chloride: 83 mmol/L — ABNORMAL LOW (ref 98–111)
Chloride: 84 mmol/L — ABNORMAL LOW (ref 98–111)
Creatinine, Ser: 1.46 mg/dL — ABNORMAL HIGH (ref 0.44–1.00)
Creatinine, Ser: 1.4 mg/dL — ABNORMAL HIGH (ref 0.44–1.00)
Creatinine, Ser: 1.37 mg/dL — ABNORMAL HIGH (ref 0.44–1.00)
Creatinine, Ser: 1.29 mg/dL — ABNORMAL HIGH (ref 0.44–1.00)
Creatinine, Ser: 1.45 mg/dL — ABNORMAL HIGH (ref 0.44–1.00)
GFR, Estimated: 35 mL/min — ABNORMAL LOW (ref >60)
GFR, Estimated: 36 mL/min — ABNORMAL LOW (ref >60)
GFR, Estimated: 37 mL/min — ABNORMAL LOW (ref >60)
GFR, Estimated: 40 mL/min — ABNORMAL LOW (ref >60)
Glucose, Bld: 91 mg/dL (ref 70–99)
Glucose, Bld: 95 mg/dL (ref 70–99)
Glucose, Bld: 94 mg/dL (ref 70–99)
Glucose, Bld: 96 mg/dL (ref 70–99)
Glucose, Bld: 114 mg/dL — ABNORMAL HIGH (ref 70–99)
Potassium: 4.2 mmol/L (ref 3.5–5.1)
Potassium: 4.3 mmol/L (ref 3.5–5.1)
Potassium: 4.4 mmol/L (ref 3.5–5.1)
Potassium: 4.2 mmol/L (ref 3.5–5.1)
Potassium: 4.2 mmol/L (ref 3.5–5.1)
Sodium: 117 mmol/L — CL (ref 135–145)
Sodium: 113 mmol/L — CL (ref 135–145)
Sodium: 113 mmol/L — CL (ref 135–145)
Sodium: 114 mmol/L — CL (ref 135–145)
Sodium: 115 mmol/L — CL (ref 135–145)

## 2020-10-06 LAB — CBC
HCT: 25.3 % — ABNORMAL LOW (ref 36.0–46.0)
Hemoglobin: 8.8 g/dL — ABNORMAL LOW (ref 12.0–15.0)
MCH: 31.1 pg (ref 26.0–34.0)
MCHC: 34.8 g/dL (ref 30.0–36.0)
MCV: 89.4 fL (ref 80.0–100.0)
Platelets: 409 10*3/uL — ABNORMAL HIGH (ref 150–400)
RBC: 2.83 MIL/uL — ABNORMAL LOW (ref 3.87–5.11)
RDW: 13.2 % (ref 11.5–15.5)
WBC: 8.4 10*3/uL (ref 4.0–10.5)
nRBC: 0 % (ref 0.0–0.2)

## 2020-10-06 LAB — ECHOCARDIOGRAM COMPLETE
Area-P 1/2: 2.42 cm2
Calc EF: 73.4 %
Height: 63 in
S' Lateral: 2.2 cm
Single Plane A2C EF: 77.4 %
Single Plane A4C EF: 69.6 %
Weight: 2190.49 oz

## 2020-10-06 LAB — SODIUM: Sodium: 112 mmol/L — CL (ref 135–145)

## 2020-10-06 MED ORDER — AMLODIPINE BESYLATE 5 MG PO TABS
5.0000 mg | ORAL_TABLET | Freq: Every day | ORAL | Status: DC
  Administered 2020-10-06 – 2020-10-10 (×5): 5 mg via ORAL
  Filled 2020-10-06 (×6): qty 1

## 2020-10-06 MED ORDER — SODIUM CHLORIDE 3 % IV SOLN
INTRAVENOUS | Status: DC
  Filled 2020-10-06 (×2): qty 500

## 2020-10-06 MED ORDER — DEXTROSE 5 % IV SOLN: INTRAVENOUS | Status: DC

## 2020-10-06 NOTE — Progress Notes (Signed)
PCCM INTERVAL PROGRESS NOTE  Repeat sodium 113  RN has called critical lab to Dr. Augustin Coupe nephrology who is recommending restarting hypertonic saline 3% at 15 mL/hr.  Will continue q 4 hour BMP and neuro checks.    Georgann Housekeeper, AGACNP-BC [facility]  See Amion for personal pager PCCM on call pager [phone] until 7pm. Please call Elink 7p-7a. [phone]  10/06/2020 2:53 PM

## 2020-10-06 NOTE — Consult Note (Signed)
H&P Physician requesting consult: Jannifer Hick  Chief Complaint: Bilateral hydronephrosis  History of Present Illness: 87 year old female previously followed by Dr. Tresa Moore.  He had diagnosed her with severe interstitial cystitis.  She has a history of bilateral hydronephrosis that has been present since at least 2017.  She had been followed by yearly renal ultrasound and hydronephrosis had remained stable and creatinine had ranged from 1.4-1.8 up until then.  She is currently admitted with hyponatremia.  Renal ultrasound showed bilateral hydronephrosis.  Admission creatinine was 1.5.  Her sodium level was noted to be 108.  CT scan was subsequently performed of the abdomen and pelvis without contrast that revealed bilateral hydronephrosis but no evidence of any obstructing stone or stricture.  Patient himself has no voiding complaints prior to this.  She states she overall has been doing very well.  Foley catheter has been placed.  Past Medical History:  Diagnosis Date  . Erythematous bladder mucosa   . Frequent urination   . Gait instability    uses walker  . GERD (gastroesophageal reflux disease)   . History of basal cell carcinoma excision    yrs ago-  nose  . History of gout    last bout yrs ago  . History of recurrent UTIs   . Hydronephrosis   . Hyperlipidemia   . Hypertension   . Nocturia   . Numbness and tingling of both legs    residual  from spinal stenosis and tumor  s/p  back surgery's  uses walker  . OA (osteoarthritis)   . Urge urinary incontinence   . Wears glasses    Past Surgical History:  Procedure Laterality Date  . CARPAL TUNNEL RELEASE Right early 2000's  . CATARACT EXTRACTION W/ INTRAOCULAR LENS IMPLANT Right yrs ago  . CERVICAL FUSION  1999   C5 -- C6  . CYSTOSCOPY W/ RETROGRADES Right 12/29/2015   Procedure: CYSTOSCOPY WITH RETROGRADE PYELOGRAM;  Surgeon: Alexis Frock, MD;  Location: [facility];  Service: Urology;  Laterality: Right;  .  CYSTOSCOPY WITH BIOPSY  12/29/2015   Procedure: CYSTOSCOPY WITH BIOPSY;  Surgeon: Alexis Frock, MD;  Location: [facility];  Service: Urology;;  . [place] N/A 12/29/2015   Procedure: CYSTOSCOPY WITH FULGERATION;  Surgeon: Alexis Frock, MD;  Location: [facility];  Service: Urology;  Laterality: N/A;  . KNEE ARTHROSCOPY Right 2010  approx  . POSTERIOR LAMINECTOMY / DECOMPRESSION LUMBAR SPINE  10/28/2014   lam.  L2 -- L4/  decompression L2 -- L5/  fusion L4-5  . THORACIC LAMINECTOMY  01/30/2011   lam. T9 -- T11 /  Resection Intraspinal extradural extramedullary tumor    Home Medications:  Medications Prior to Admission  Medication Sig Dispense Refill Last Dose  . acetaminophen (TYLENOL) 500 MG tablet Take 1,000 mg by mouth every 6 (six) hours as needed for moderate pain.    unk  . allopurinol (ZYLOPRIM) 300 MG tablet Take 150 mg by mouth daily.   10/04/2020 at Unknown time  . amLODipine (NORVASC) 5 MG tablet Take 5 mg by mouth daily.   10/04/2020 at Unknown time  . Cyanocobalamin (VITAMIN B12) 1000 MCG TBCR Take 1,000 mcg by mouth daily.   10/04/2020 at Unknown time  . diclofenac Sodium (VOLTAREN) 1 % GEL APPLY 2 GRAMS TO AFFECTED AREA 4 TIMES A DAY (Patient taking differently: Apply 2 g topically 2 (two) times daily as needed (pain).) 100 g 12 unk  . Multiple Vitamin (MULTIVITAMIN) tablet Take 1  tablet by mouth daily.   10/04/2020 at Unknown time  . OVER THE COUNTER MEDICATION Take 1 tablet by mouth daily. Vit D 3 125 mcg/ 100 mcg Vit K w Coconut oil   10/04/2020 at Unknown time  . Probiotic Product ([place]) CAPS Take 1 capsule by mouth daily.   10/04/2020 at Unknown time  . zinc gluconate 50 MG tablet Take 25 mg by mouth daily.   10/04/2020 at Unknown time   Allergies: No Known Allergies  Family History  Problem Relation Age of Onset  . Arthritis Mother   . Cancer Father    Social History:  reports that she has never smoked.  She has never used smokeless tobacco. She reports that she does not drink alcohol and does not use drugs.  ROS: A complete review of systems was performed.  All systems are negative except for pertinent findings as noted. ROS   Physical Exam:  Vital signs in last 24 hours: Temp:  [97.9 F (36.6 C)-98.3 F (36.8 C)] 98.3 F (36.8 C) (05/25 1723) Pulse Rate:  [68-88] 72 (05/25 1900) Resp:  [14-29] 19 (05/25 1900) BP: (114-170)/(49-79) 134/57 (05/25 1900) SpO2:  [94 %-99 %] 97 % (05/25 1900) Weight:  [61 kg-62.1 kg] 62.1 kg (05/25 0425) General:  Alert and oriented, No acute distress HEENT: Normocephalic, atraumatic Neck: No JVD or lymphadenopathy Cardiovascular: Regular rate and rhythm Lungs: Regular rate and effort Abdomen: Nondistended Genitourinary: Foley cleaning clear yellow urine Back: No CVA tenderness Extremities: No edema Neurologic: Grossly intact  Laboratory Data:  Results for orders placed or performed during the hospital encounter of 10/05/20 (from the past 24 hour(s))  TSH     Status: None   Collection Time: 10/05/20  8:29 PM  Result Value Ref Range   TSH 1.100 0.350 - 4.500 uIU/mL  Cortisol     Status: None   Collection Time: 10/05/20  8:29 PM  Result Value Ref Range   Cortisol, Plasma 8.8 ug/dL  Basic metabolic panel     Status: Abnormal   Collection Time: 10/05/20  8:29 PM  Result Value Ref Range   Sodium 110 (LL) 135 - 145 mmol/L   Potassium 4.5 3.5 - 5.1 mmol/L   Chloride 81 (L) 98 - 111 mmol/L   CO2 23 22 - 32 mmol/L   Glucose, Bld 97 70 - 99 mg/dL   BUN 29 (H) 8 - 23 mg/dL   Creatinine, Ser 1.38 (H) 0.44 - 1.00 mg/dL   Calcium 8.6 (L) 8.9 - 10.3 mg/dL   GFR, Estimated 37 (L) >60 mL/min   Anion gap 6 5 - 15  Sodium     Status: Abnormal   Collection Time: 10/06/20 12:35 AM  Result Value Ref Range   Sodium 112 (LL) 135 - 145 mmol/L  CBC     Status: Abnormal   Collection Time: 10/06/20  4:19 AM  Result Value Ref Range   WBC 8.4 4.0 - 10.5 K/uL    RBC 2.83 (L) 3.87 - 5.11 MIL/uL   Hemoglobin 8.8 (L) 12.0 - 15.0 g/dL   HCT 25.3 (L) 36.0 - 46.0 %   MCV 89.4 80.0 - 100.0 fL   MCH 31.1 26.0 - 34.0 pg   MCHC 34.8 30.0 - 36.0 g/dL   RDW 13.2 11.5 - 15.5 %   Platelets 409 (H) 150 - 400 K/uL   nRBC 0.0 0.0 - 0.2 %  Basic metabolic panel     Status: Abnormal   Collection Time: 10/06/20  4:19 AM  Result Value Ref Range   Sodium 117 (LL) 135 - 145 mmol/L   Potassium 4.2 3.5 - 5.1 mmol/L   Chloride 88 (L) 98 - 111 mmol/L   CO2 22 22 - 32 mmol/L   Glucose, Bld 91 70 - 99 mg/dL   BUN 31 (H) 8 - 23 mg/dL   Creatinine, Ser 1.46 (H) 0.44 - 1.00 mg/dL   Calcium 8.4 (L) 8.9 - 10.3 mg/dL   GFR, Estimated 35 (L) >60 mL/min   Anion gap 7 5 - 15  Basic metabolic panel     Status: Abnormal   Collection Time: 10/06/20  8:07 AM  Result Value Ref Range   Sodium 113 (LL) 135 - 145 mmol/L   Potassium 4.3 3.5 - 5.1 mmol/L   Chloride 83 (L) 98 - 111 mmol/L   CO2 22 22 - 32 mmol/L   Glucose, Bld 95 70 - 99 mg/dL   BUN 29 (H) 8 - 23 mg/dL   Creatinine, Ser 1.40 (H) 0.44 - 1.00 mg/dL   Calcium 8.4 (L) 8.9 - 10.3 mg/dL   GFR, Estimated 36 (L) >60 mL/min   Anion gap 8 5 - 15  Basic metabolic panel     Status: Abnormal   Collection Time: 10/06/20 12:16 PM  Result Value Ref Range   Sodium 113 (LL) 135 - 145 mmol/L   Potassium 4.4 3.5 - 5.1 mmol/L   Chloride 82 (L) 98 - 111 mmol/L   CO2 24 22 - 32 mmol/L   Glucose, Bld 94 70 - 99 mg/dL   BUN 29 (H) 8 - 23 mg/dL   Creatinine, Ser 1.37 (H) 0.44 - 1.00 mg/dL   Calcium 8.4 (L) 8.9 - 10.3 mg/dL   GFR, Estimated 37 (L) >60 mL/min   Anion gap 7 5 - 15  Basic metabolic panel     Status: Abnormal   Collection Time: 10/06/20  4:21 PM  Result Value Ref Range   Sodium 114 (LL) 135 - 145 mmol/L   Potassium 4.2 3.5 - 5.1 mmol/L   Chloride 83 (L) 98 - 111 mmol/L   CO2 25 22 - 32 mmol/L   Glucose, Bld 96 70 - 99 mg/dL   BUN 30 (H) 8 - 23 mg/dL   Creatinine, Ser 1.29 (H) 0.44 - 1.00 mg/dL   Calcium 8.3  (L) 8.9 - 10.3 mg/dL   GFR, Estimated 40 (L) >60 mL/min   Anion gap 6 5 - 15   Recent Results (from the past 240 hour(s))  Resp Panel by RT-PCR (Flu A&B, Covid) Nasopharyngeal Swab     Status: None   Collection Time: 10/05/20  2:34 PM   Specimen: Nasopharyngeal Swab; Nasopharyngeal(NP) swabs in vial transport medium  Result Value Ref Range Status   SARS Coronavirus 2 by RT PCR NEGATIVE NEGATIVE Final    Comment: (NOTE) SARS-CoV-2 target nucleic acids are NOT DETECTED.  The SARS-CoV-2 RNA is generally detectable in upper respiratory specimens during the acute phase of infection. The lowest concentration of SARS-CoV-2 viral copies this assay can detect is 138 copies/mL. A negative result does not preclude SARS-Cov-2 infection and should not be used as the sole basis for treatment or other patient management decisions. A negative result may occur with  improper specimen collection/handling, submission of specimen other than nasopharyngeal swab, presence of viral mutation(s) within the areas targeted by this assay, and inadequate number of viral copies(<138 copies/mL). A negative result must be combined with clinical observations, patient history, and epidemiological  information. The expected result is Negative.  Fact Sheet for Patients:  EntrepreneurPulse.com.au  Fact Sheet for Healthcare Providers:  IncredibleEmployment.be  This test is no t yet approved or cleared by the [place] FDA and  has been authorized for detection and/or diagnosis of SARS-CoV-2 by FDA under an Emergency Use Authorization (EUA). This EUA will remain  in effect (meaning this test can be used) for the duration of the COVID-19 declaration under Section 564(b)(1) of the Act, 21 U.S.C.section 360bbb-3(b)(1), unless the authorization is terminated  or revoked sooner.       Influenza A by PCR NEGATIVE NEGATIVE Final   Influenza B by PCR NEGATIVE NEGATIVE Final     Comment: (NOTE) The Xpert Xpress SARS-CoV-2/FLU/RSV plus assay is intended as an aid in the diagnosis of influenza from Nasopharyngeal swab specimens and should not be used as a sole basis for treatment. Nasal washings and aspirates are unacceptable for Xpert Xpress SARS-CoV-2/FLU/RSV testing.  Fact Sheet for Patients: EntrepreneurPulse.com.au  Fact Sheet for Healthcare Providers: IncredibleEmployment.be  This test is not yet approved or cleared by the [place] FDA and has been authorized for detection and/or diagnosis of SARS-CoV-2 by FDA under an Emergency Use Authorization (EUA). This EUA will remain in effect (meaning this test can be used) for the duration of the COVID-19 declaration under Section 564(b)(1) of the Act, 21 U.S.C. section 360bbb-3(b)(1), unless the authorization is terminated or revoked.  Performed at [facility], [place] [address], [place], [place] [address]   MRSA PCR Screening     Status: None   Collection Time: 10/05/20  7:38 PM   Specimen: Nasal Mucosa; Nasopharyngeal  Result Value Ref Range Status   MRSA by PCR NEGATIVE NEGATIVE Final    Comment:        The GeneXpert MRSA Assay (FDA approved for NASAL specimens only), is one component of a comprehensive MRSA colonization surveillance program. It is not intended to diagnose MRSA infection nor to guide or monitor treatment for MRSA infections. Performed at [facility], [place] [address], [place], [place] [address]    Creatinine: Recent Labs    10/05/20 1335 10/05/20 2029 10/06/20 0419 10/06/20 0807 10/06/20 1216 10/06/20 1621  CREATININE 1.50* 1.38* 1.46* 1.40* 1.37* 1.29*   CT scan and renal ultrasound reviewed  Impression/Assessment:  Chronic bilateral hydronephrosis Interstitial cystitis  Plan:  Continue Foley catheter until she clinically improves.  It looks like her hydronephrosis is chronic in severity.   Given her age of 82 and the fact that her creatinine has been stable, would recommend trial void after she clinically improves and then her creatinine and symptoms can be followed either here or on an outpatient basis.  No surgical intervention necessary.  Marton Redwood, III 10/06/2020, 8:21 PM

## 2020-10-06 NOTE — Progress Notes (Signed)
Elink called at this time regarding Na of 117. Awaiting orders. Will continue to monitor.

## 2020-10-06 NOTE — TOC Initial Note (Signed)
Transition of Care [facility]) - Initial/Assessment Note    Patient Details  Name: Brenda [name] MRN: [id number] Date of Birth: 1931-04-05  Transition of Care [facility]) CM/SW Contact:    Leeroy Cha, RN Phone Number: 10/06/2020, 7:38 AM  Clinical Narrative:                  87 yo woman with chronic hyponatremia who is here with nausea, vomiting and acute on chronic hyponatremia. Sent here from pcp's office after labs were drawn. Recurrent UTIs. POA at bedside and states that she has been restricting her to 64 oz of water daily to help with low sodium. Notes that patient has been urinating frequently and feeling thirsty.     Objective:      Vitals:     10/05/20 1750  10/05/20 1800   BP:  (!) 152/71  (!) 138/94   Pulse:  83  89   Resp:  18  (!) 21   Temp:       SpO2:  97%  98%       Gen:          No acute distress, resting comfortably on room air  HEENT:  mmm  Lungs:    Clear to auscultation bilaterally; normal respiratory effort  CV:         Regular rate and rhythm; no murmurs  Abd:          + bowel sounds; soft, non-tender; no palpable masses, no distension  Ext:           Mild pitting edema lower extremities  Skin:       thin skin, few ecchymoses  Neuro:    alert and oriented x 3  Psych:    normal mood and affect     Labs/Imaging:  Na 108, K 5.5, Cl 78, Cr 1.5  Urine Na and Osm pending  UA shows pyuria, +bacteria  WBC 10.1    Assessment and Plan:  Severe Hyponatremia  AKI   Recurrent UTIs  DNR   PLAN: to return to home with husband.  Expected Discharge Plan: Home/Self Care Barriers to Discharge: Continued Medical Work up   Patient Goals and CMS Choice Patient states their goals for this hospitalization and ongoing recovery are:: to go home CMS Medicare.gov Compare Post Acute Care list provided to:: Patient    Expected Discharge Plan and Services Expected Discharge Plan: Home/Self Care   Discharge Planning Services: CM Consult   Living  arrangements for the past 2 months: Single Family Home                                      Prior Living Arrangements/Services Living arrangements for the past 2 months: Single Family Home Lives with:: Spouse Patient language and need for interpreter reviewed:: Yes Do you feel safe going back to the place where you live?: Yes            Criminal Activity/Legal Involvement Pertinent to Current Situation/Hospitalization: No - Comment as needed  Activities of Daily Living Home Assistive Devices/Equipment: Eyeglasses,Cane (specify quad or straight),Walker (specify type),Shower chair with back ADL Screening (condition at time of admission) Patient's cognitive ability adequate to safely complete daily activities?: Yes Is the patient deaf or have difficulty hearing?: Yes (slight) Does the patient have difficulty seeing, even when wearing glasses/contacts?: No Does the patient have difficulty concentrating, remembering, or making decisions?: Yes (  only if real sleepy) Patient able to express need for assistance with ADLs?: Yes Does the patient have difficulty dressing or bathing?: Yes Independently performs ADLs?: No Communication: Independent Dressing (OT): Needs assistance Is this a change from baseline?: Pre-admission baseline Grooming: Independent Feeding: Independent Bathing: Needs assistance Is this a change from baseline?: Pre-admission baseline Toileting: Needs assistance Is this a change from baseline?: Pre-admission baseline In/Out Bed: Needs assistance Is this a change from baseline?: Pre-admission baseline Walks in Home: Needs assistance Is this a change from baseline?: Pre-admission baseline Does the patient have difficulty walking or climbing stairs?: Yes Weakness of Legs: Both Weakness of Arms/Hands: Both  Permission Sought/Granted                  Emotional Assessment Appearance:: Appears stated age Attitude/Demeanor/Rapport: Engaged Affect  (typically observed): Calm Orientation: : Oriented to Place,Oriented to Self,Oriented to  Time,Oriented to Situation Alcohol / Substance Use: Not Applicable Psych Involvement: No (comment)  Admission diagnosis:  Acute hyponatremia [E87.1] Hyponatremia [E87.1] Renal failure [N19] Patient Active Problem List   Diagnosis Date Noted  . Hyponatremia 10/05/2020  . Acute hyponatremia   . AKI (acute kidney injury) ([place])   . Hyperkalemia   . Edema   . Gross hematuria 12/29/2015  . Sepsis secondary to UTI ([place]) 12/12/2014  . Essential hypertension 12/12/2014  . Spondylolisthesis of lumbar region 10/28/2014  . T10 spinal cord injury ([place]) 10/10/2011  . 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

## 2020-10-06 NOTE — Progress Notes (Signed)
[facility] Progress Note   Assessment/Plan: 1. Hyponatremia - unclear etiology but certainly nausea is a very strong stimulant of vasopressin release and is playing a role though her 5/24 urine osm 262, serum osm 242 show not a strong component of ADH. Her lower extremity edema is a result of Amlodipine and unlikely to be from CHF.  Marland Kitchen By history doesn't appear to be from poor solute intake which can cause a reset osmostat; she had  poor oral intake after the nausea appeared Sunday.  TSH normal, cortisol 8.8 collected at 830pm.  BP has been high/normal.  - serum sodium increased from 109 > 112 at MN > 117 this am 4am; 3% saline stopped and D5W started at 50.  Repeat at 8am was 113.  D/w RN bedside - Stop D5W and recheck sodium ~11am and cont q4h checks thereafter.  -Given urine osm only 262 I don't think she's going to have a brisk free water diuresis and will hold on DDAVP for now as I think this may make things worse - Cont water restrict to 1L / day. - Strict I&O's. 2. Hyperkalemia may be related to AKI +/- CKD:  Resolved with med mgmt.  Cont  To follow 3. AKI or CKD? - Renal US with bilateral moderate hydronephrosis.  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 Tresa Moore but last eval preCOVID.  Consulted urology who rec ct stone protocol.  Will check bladder scan now and place foley if retaining as well.   4. HTN - resume amlodipine. She was recently taken off Lisinopril likely because of AKI + hyperkalemia. 5. Gout - hold allopurinol for now.  Subjective:   eval with daughter bedside - globally improved today.  No further nausea or emesis; no dizziness or confusion.  No dyspnea.    Objective Vitals:   10/06/20 0425 10/06/20 0500 10/06/20 0800 10/06/20 0833  BP:  (!) 161/64 (!) 154/67   Pulse:  84 87   Resp:  (!) 24 (!) 29   Temp:    98.3 F (36.8 C)  TempSrc:    Oral  SpO2:  94% 98%   Weight: 62.1 kg     Height:       I/Os yesterday:  570 / 1050 Physical Exam General: elderly woman alert in bed Heart: RRR Lungs: normal WOB on RA Abdomen: soft, nondistended Extremities: no edema Neuro:  Conversant, nonfocal; hard of hearing   Additional Objective Labs: Basic Metabolic Panel: Recent Labs  Lab 10/05/20 1335 10/05/20 1714 10/05/20 2029 10/06/20 0035 10/06/20 0419 10/06/20 0807  NA 108*   < > 110* 112* 117* 113*  K 5.5*  --  4.5  --  4.2 4.3  CL 78*  --  81*  --  88* 83*  CO2 21*  --  23  --  22 22  GLUCOSE 114*  --  97  --  91 95  BUN 31*  --  29*  --  31* 29*  CREATININE 1.50*  --  1.38*  --  1.46* 1.40*  CALCIUM 8.7*  --  8.6*  --  8.4* 8.4*  PHOS 3.7  --   --   --   --   --    < > = values in this interval not displayed.   Liver Function Tests: No results for input(s): AST, ALT, ALKPHOS, BILITOT, PROT, ALBUMIN in the last 168 hours. No results for input(s): LIPASE, AMYLASE in the last  168 hours. CBC: Recent Labs  Lab 10/05/20 1335 10/06/20 0419  WBC 10.1 8.4  NEUTROABS 6.8  --   HGB 9.3* 8.8*  HCT 26.7* 25.3*  MCV 88.1 89.4  PLT 500* 409*   Blood Culture    Component Value Date/Time   SDES URINE, CLEAN CATCH 12/12/2014 1940   SPECREQUEST ADDED 2202 12/12/2014 1940   CULT MULTIPLE SPECIES PRESENT, SUGGEST RECOLLECTION 12/12/2014 1940   REPTSTATUS 12/14/2014 FINAL 12/12/2014 1940    Cardiac Enzymes: No results for input(s): CKTOTAL, CKMB, CKMBINDEX, TROPONINI in the last 168 hours. CBG: No results for input(s): GLUCAP in the last 168 hours. Iron Studies: No results for input(s): IRON, TIBC, TRANSFERRIN, FERRITIN in the last 72 hours. @lablastinr3 @ Studies/Results: US RENAL  Result Date: 10/05/2020 CLINICAL DATA:  Renal failure. EXAM: RENAL / URINARY TRACT ULTRASOUND COMPLETE COMPARISON:  February 12, 2018. FINDINGS: Right Kidney: Renal measurements: 11.3 x 6.9 x 5.2 cm = volume: 212 mL. Moderate right hydronephrosis is noted. Echogenicity within normal limits. No mass  visualized. Left Kidney: Renal measurements: 9.8 x 4.9 x 5.0 cm = volume: 127 mL. Moderate left hydronephrosis is noted. Echogenicity within normal limits. No mass visualized. Bladder: Appears normal for degree of bladder distention. No ureteral jets are noted. Other: None. IMPRESSION: Moderate bilateral hydronephrosis is noted. CT urogram is recommended to rule out distal ureteral obstruction. Electronically Signed   By: Marijo Conception M.D.   On: 10/05/2020 17:42   Medications:  . chlorhexidine  15 mL Mouth Rinse BID  . Chlorhexidine Gluconate Cloth  6 each Topical Daily  . heparin  5,000 Units Subcutaneous Q8H  . mouth rinse  15 mL Mouth Rinse q12n4p  . vitamin B-12  1,000 mcg Oral Daily     Jannifer Hick MD 10/06/2020, 9:03 AM  [facility] Pager: [phone]

## 2020-10-06 NOTE — Progress Notes (Signed)
NAME:  Brenda [name], MRN:  [id number], DOB:  16-Oct-1930, LOS: 1 ADMISSION DATE:  10/05/2020, CONSULTATION DATE:  10/05/2020 REFERRING MD:  Dr. Roderic Palau, CHIEF COMPLAINT:  Hyponatremia   History of Present Illness:   Patient is a 87 yo female with PMH chronic hyponatremia (baseline NA 127), HTN, HLD, OA, hydronephrosis, and recurrent UTI's of  presenting to [facility] on 10/05/2020 with 2-3 d h/o N&V, mild LE edema and lab work identified by PCP showing severe hyponatremia. Sodium 108 on presentation. Seen by nephro in ED who recommended hypertonic saline. PCCM asked to admit.   Pertinent  Medical History   has a past medical history of Erythematous bladder mucosa, Frequent urination, Gait instability, GERD (gastroesophageal reflux disease), History of basal cell carcinoma excision, History of gout, History of recurrent UTIs, Hydronephrosis, Hyperlipidemia, Hypertension, Nocturia, Numbness and tingling of both legs, OA (osteoarthritis), Urge urinary incontinence, and Wears glasses.   Significant Hospital Events: Including procedures, antibiotic start and stop dates in addition to other pertinent events   . 5/24: patient admitted to [facility] for hyponatremia. Hypertonic saline given. . 5/25: hypertonic held after Na 108 - 117 in 15 hour period. D5W started.  Interim History / Subjective:  No complaints.   Objective   Blood pressure (!) 161/64, pulse 84, temperature 97.9 F (36.6 C), temperature source Oral, resp. rate (!) 24, height 5\' 3"  (1.6 m), weight 62.1 kg, SpO2 94 %.        Intake/Output Summary (Last 24 hours) at 10/06/2020 0804 Last data filed at 10/06/2020 0700 Gross per 24 hour  Intake 569.98 ml  Output 1050 ml  Net -480.02 ml   Filed Weights   10/05/20 1314 10/05/20 2045 10/06/20 0425  Weight: 60.3 kg 61 kg 62.1 kg    Examination:  General:  Elderly female in NAD HEENT: [place]/AT, PERRL Neuro: Alert, oriented, non-focal CV: RRR, no MRG PULM: Clear bilateral breath sounds. No  distress PF:XTKW, normoactive. nondistended Extremities: No acute deformity. No edema.  Skin: Grossly intact.    Labs/imaging that I have personally reviewed  (right click and "Reselect all SmartList Selections" daily)  Na 108 >110 >112 >117 Chloride 88 Hgb 8.8 Cortisol 8.8 (PM) TSH 1.1 Osm 243 Urine studies > Osm 262, Na 50, Creatinine 45.4, Chloride 52  Resolved Hospital Problem list     Assessment & Plan:   Acute on chronic symptomatic hyporosmolar hyponatremia: likely euvolemic but possibly hypovolemic. Initially started on 3%, which was stopped after Na corrected 108-117 in roughly 15 hours. D5W started and NA back to 113.  Plan: - Nephrology following, appreciate recommendations.  - Hypertonic held, D5W started. NA back to 113. Will hold D5W and repeat BMP at 11 and q 4 hours.  - no DDAVP - Restrict water to 1L/day - Trend BMP/UO  AKI Hyperkalemia improved ? Hx Hunners disease, interstitial cystitis.  Plan: - Urology consulted by nephrology. Stone study pending. Bladder scan and place foley if urine retention.   Anemia Plan: - Trend CBC - Transfuse for hgb < 7  Hx of HTN Plan: -continue home amlodipine  Hx of Gout Plan: -Allopurinol on hold  Best practice (right click and "Reselect all SmartList Selections" daily)  Diet:  Oral Pain/Anxiety/Delirium protocol (if indicated): No VAP protocol (if indicated): Not indicated DVT prophylaxis: Subcutaneous Heparin GI prophylaxis: N/A Glucose control:  SSI No Central venous access:  N/A Arterial line:  N/A Foley:  N/A Mobility:  bed rest  PT consulted: N/A Last date of multidisciplinary goals of care  discussion []  Code Status:  full code Disposition: ICU   Critical care time:      Georgann Housekeeper, AGACNP-BC [place] for personal pager PCCM on call pager [phone] until 7pm. Please call Elink 7p-7a. [phone]  10/06/2020 8:07 AM

## 2020-10-06 NOTE — Progress Notes (Signed)
  Echocardiogram 2D Echocardiogram has been performed.  Brenda [name] 10/06/2020, 2:18 PM

## 2020-10-06 NOTE — Progress Notes (Addendum)
eLink Physician-Brief Progress Note Patient Name: Brenda [name] DOB: 20-Jan-1931 MRN: [id number]   Date of Service  10/06/2020  HPI/Events of Note  Hyponatremia - Na+ = 108 --> 112 --> 117 in about 12 hours.  eICU Interventions  Plan: 1. D/C 3% NaCl IV infusion. 2. Continue to trend Na+.     Intervention Category Major Interventions: Electrolyte abnormality - evaluation and management  Faiz Weber [place] 10/06/2020, 5:28 AM

## 2020-10-07 DIAGNOSIS — N1832 Chronic kidney disease, stage 3b: Secondary | ICD-10-CM

## 2020-10-07 DIAGNOSIS — E871 Hypo-osmolality and hyponatremia: Secondary | ICD-10-CM | POA: Diagnosis not present

## 2020-10-07 DIAGNOSIS — N179 Acute kidney failure, unspecified: Secondary | ICD-10-CM | POA: Diagnosis not present

## 2020-10-07 LAB — URINALYSIS, ROUTINE W REFLEX MICROSCOPIC
Bilirubin Urine: NEGATIVE
Glucose, UA: 50 mg/dL — AB
Ketones, ur: NEGATIVE mg/dL
Nitrite: NEGATIVE
Protein, ur: 100 mg/dL — AB
Specific Gravity, Urine: 1.008 (ref 1.005–1.030)
WBC, UA: >50 WBC/hpf — ABNORMAL HIGH (ref 0–5)
pH: 6 (ref 5.0–8.0)

## 2020-10-07 LAB — BASIC METABOLIC PANEL
Anion gap: 8 (ref 5–15)
Anion gap: 9 (ref 5–15)
Anion gap: 7 (ref 5–15)
Anion gap: 8 (ref 5–15)
Anion gap: 7 (ref 5–15)
Anion gap: 9 (ref 5–15)
Anion gap: 9 (ref 5–15)
BUN: 33 mg/dL — ABNORMAL HIGH (ref 8–23)
BUN: 32 mg/dL — ABNORMAL HIGH (ref 8–23)
BUN: 30 mg/dL — ABNORMAL HIGH (ref 8–23)
BUN: 27 mg/dL — ABNORMAL HIGH (ref 8–23)
BUN: 28 mg/dL — ABNORMAL HIGH (ref 8–23)
BUN: 28 mg/dL — ABNORMAL HIGH (ref 8–23)
BUN: 31 mg/dL — ABNORMAL HIGH (ref 8–23)
CO2: 23 mmol/L (ref 22–32)
CO2: 20 mmol/L — ABNORMAL LOW (ref 22–32)
CO2: 22 mmol/L (ref 22–32)
CO2: 22 mmol/L (ref 22–32)
CO2: 24 mmol/L (ref 22–32)
CO2: 23 mmol/L (ref 22–32)
CO2: 21 mmol/L — ABNORMAL LOW (ref 22–32)
Calcium: 8.3 mg/dL — ABNORMAL LOW (ref 8.9–10.3)
Calcium: 8.2 mg/dL — ABNORMAL LOW (ref 8.9–10.3)
Calcium: 8.1 mg/dL — ABNORMAL LOW (ref 8.9–10.3)
Calcium: 8 mg/dL — ABNORMAL LOW (ref 8.9–10.3)
Calcium: 8.3 mg/dL — ABNORMAL LOW (ref 8.9–10.3)
Calcium: 8.4 mg/dL — ABNORMAL LOW (ref 8.9–10.3)
Calcium: 8.2 mg/dL — ABNORMAL LOW (ref 8.9–10.3)
Chloride: 85 mmol/L — ABNORMAL LOW (ref 98–111)
Chloride: 88 mmol/L — ABNORMAL LOW (ref 98–111)
Chloride: 90 mmol/L — ABNORMAL LOW (ref 98–111)
Chloride: 89 mmol/L — ABNORMAL LOW (ref 98–111)
Chloride: 90 mmol/L — ABNORMAL LOW (ref 98–111)
Chloride: 89 mmol/L — ABNORMAL LOW (ref 98–111)
Chloride: 92 mmol/L — ABNORMAL LOW (ref 98–111)
Creatinine, Ser: 1.31 mg/dL — ABNORMAL HIGH (ref 0.44–1.00)
Creatinine, Ser: 1.28 mg/dL — ABNORMAL HIGH (ref 0.44–1.00)
Creatinine, Ser: 1.24 mg/dL — ABNORMAL HIGH (ref 0.44–1.00)
Creatinine, Ser: 1.2 mg/dL — ABNORMAL HIGH (ref 0.44–1.00)
Creatinine, Ser: 1.36 mg/dL — ABNORMAL HIGH (ref 0.44–1.00)
Creatinine, Ser: 1.3 mg/dL — ABNORMAL HIGH (ref 0.44–1.00)
Creatinine, Ser: 1.33 mg/dL — ABNORMAL HIGH (ref 0.44–1.00)
GFR, Estimated: 39 mL/min — ABNORMAL LOW (ref >60)
GFR, Estimated: 41 mL/min — ABNORMAL LOW (ref >60)
GFR, Estimated: 42 mL/min — ABNORMAL LOW (ref >60)
GFR, Estimated: 44 mL/min — ABNORMAL LOW (ref >60)
GFR, Estimated: 38 mL/min — ABNORMAL LOW (ref >60)
GFR, Estimated: 40 mL/min — ABNORMAL LOW (ref >60)
GFR, Estimated: 39 mL/min — ABNORMAL LOW (ref >60)
Glucose, Bld: 98 mg/dL (ref 70–99)
Glucose, Bld: 96 mg/dL (ref 70–99)
Glucose, Bld: 112 mg/dL — ABNORMAL HIGH (ref 70–99)
Glucose, Bld: 144 mg/dL — ABNORMAL HIGH (ref 70–99)
Glucose, Bld: 126 mg/dL — ABNORMAL HIGH (ref 70–99)
Glucose, Bld: 129 mg/dL — ABNORMAL HIGH (ref 70–99)
Glucose, Bld: 133 mg/dL — ABNORMAL HIGH (ref 70–99)
Potassium: 4.2 mmol/L (ref 3.5–5.1)
Potassium: 4.1 mmol/L (ref 3.5–5.1)
Potassium: 3.9 mmol/L (ref 3.5–5.1)
Potassium: 4 mmol/L (ref 3.5–5.1)
Potassium: 4.3 mmol/L (ref 3.5–5.1)
Potassium: 4.3 mmol/L (ref 3.5–5.1)
Potassium: 4 mmol/L (ref 3.5–5.1)
Sodium: 116 mmol/L — CL (ref 135–145)
Sodium: 117 mmol/L — CL (ref 135–145)
Sodium: 119 mmol/L — CL (ref 135–145)
Sodium: 119 mmol/L — CL (ref 135–145)
Sodium: 121 mmol/L — ABNORMAL LOW (ref 135–145)
Sodium: 121 mmol/L — ABNORMAL LOW (ref 135–145)
Sodium: 122 mmol/L — ABNORMAL LOW (ref 135–145)

## 2020-10-07 LAB — CBC
HCT: 26.4 % — ABNORMAL LOW (ref 36.0–46.0)
Hemoglobin: 9 g/dL — ABNORMAL LOW (ref 12.0–15.0)
MCH: 30.6 pg (ref 26.0–34.0)
MCHC: 34.1 g/dL (ref 30.0–36.0)
MCV: 89.8 fL (ref 80.0–100.0)
Platelets: 439 10*3/uL — ABNORMAL HIGH (ref 150–400)
RBC: 2.94 MIL/uL — ABNORMAL LOW (ref 3.87–5.11)
RDW: 13.2 % (ref 11.5–15.5)
WBC: 9.7 10*3/uL (ref 4.0–10.5)
nRBC: 0 % (ref 0.0–0.2)

## 2020-10-07 LAB — PHOSPHORUS: Phosphorus: 3.7 mg/dL (ref 2.5–4.6)

## 2020-10-07 LAB — MAGNESIUM: Magnesium: 1.5 mg/dL — ABNORMAL LOW (ref 1.7–2.4)

## 2020-10-07 MED ORDER — SODIUM CHLORIDE 1 G PO TABS
1.0000 g | ORAL_TABLET | Freq: Two times a day (BID) | ORAL | Status: DC
  Administered 2020-10-07 – 2020-10-10 (×7): 1 g via ORAL
  Filled 2020-10-07 (×8): qty 1

## 2020-10-07 MED ORDER — MAGNESIUM SULFATE 2 GM/50ML IV SOLN
2.0000 g | Freq: Once | INTRAVENOUS | Status: AC
  Administered 2020-10-07: 2 g via INTRAVENOUS
  Filled 2020-10-07: qty 50

## 2020-10-07 NOTE — Progress Notes (Addendum)
Patient has been having very cloudy yellow urine with sediment out of foley. UA ordered by CCM and results reviewed. Per CCM, no additional abx will be ordered at this time d/t absence of fever, elevated WBC in labs. NP [place] is aware of gross amounts of WBCs in UA sample. Dr Augustin Coupe with nephrology also notified of findings, and plans to place order for a urine culture. This RN will continue to carefully monitor for changes in patient condition.

## 2020-10-07 NOTE — Progress Notes (Addendum)
[facility] Progress Note   Assessment/Plan: 1. Hyponatremia - unclear etiology but certainly nausea is a very strong stimulant of vasopressin release and is playing a role though her 5/24 urine osm 262, serum osm 242 show not a strong component of ADH. Her lower extremity edema is a result of Amlodipine and unlikely to be from CHF.  Marland Kitchen By history doesn't appear to be from poor solute intake which can cause a reset osmostat; she had  poor oral intake after the nausea appeared Sunday.  TSH normal, cortisol 8.8 collected at 830pm.  BP has been high/normal.  - serum sodium increased now to 119 on 3% saline @ 20 ml/hr.  - would like to discontinue at about 2pm today and also start NaCl tablets. Then will reassess in am.  - Cont water restrict to 1L / day. - Strict I&O's.   2. Hyperkalemia may be related to AKI +/- CKD:  Resolved with med mgmt.  Cont  To follow 3. AKI or CKD? - Renal US with bilateral moderate hydronephrosis.  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 Tresa Moore but last eval preCOVID.  Consulted urology who rec ct stone protocol.  Will check bladder scan now and place foley if retaining as well.   4. HTN - resume amlodipine. She was recently taken off Lisinopril likely because of AKI + hyperkalemia. 5. Gout - hold allopurinol for now.  Subjective:   eval with daughter bedside - improved today.  No further nausea or emesis; no dizziness or confusion.  No dyspnea.  Out of bed to chair easily also.  Objective Vitals:   10/07/20 0900 10/07/20 1000 10/07/20 1100 10/07/20 1137  BP: 134/66 (!) 135/59 (!) 138/58   Pulse: 93 95 85   Resp: 18 18 18    Temp:    97.7 F (36.5 C)  TempSrc:    Oral  SpO2: 98% 97% 98%   Weight:      Height:      I/Os yesterday:  570 / 1050 Physical Exam General: elderly woman alert in bed Heart: RRR Lungs: normal WOB on RA Abdomen: soft, nondistended Extremities: no edema Neuro:   Conversant, nonfocal; hard of hearing   Additional Objective Labs: Basic Metabolic Panel: Recent Labs  Lab 10/05/20 1335 10/05/20 1714 10/07/20 0033 10/07/20 0431 10/07/20 0906  NA 108*   < > 116* 117* 119*  K 5.5*   < > 4.2 4.1 3.9  CL 78*   < > 85* 88* 90*  CO2 21*   < > 23 20* 22  GLUCOSE 114*   < > 98 96 112*  BUN 31*   < > 33* 32* 30*  CREATININE 1.50*   < > 1.31* 1.28* 1.24*  CALCIUM 8.7*   < > 8.3* 8.2* 8.1*  PHOS 3.7  --   --  3.7  --    < > = values in this interval not displayed.   Liver Function Tests: No results for input(s): AST, ALT, ALKPHOS, BILITOT, PROT, ALBUMIN in the last 168 hours. No results for input(s): LIPASE, AMYLASE in the last 168 hours. CBC: Recent Labs  Lab 10/05/20 1335 10/06/20 0419 10/07/20 0431  WBC 10.1 8.4 9.7  NEUTROABS 6.8  --   --   HGB 9.3* 8.8* 9.0*  HCT 26.7* 25.3* 26.4*  MCV 88.1 89.4 89.8  PLT 500* 409* 439*   Blood Culture    Component Value Date/Time   SDES URINE, CLEAN  CATCH 12/12/2014 1940   SPECREQUEST ADDED 2202 12/12/2014 1940   CULT MULTIPLE SPECIES PRESENT, SUGGEST RECOLLECTION 12/12/2014 1940   REPTSTATUS 12/14/2014 FINAL 12/12/2014 1940    Cardiac Enzymes: No results for input(s): CKTOTAL, CKMB, CKMBINDEX, TROPONINI in the last 168 hours. CBG: No results for input(s): GLUCAP in the last 168 hours. Iron Studies: No results for input(s): IRON, TIBC, TRANSFERRIN, FERRITIN in the last 72 hours. @lablastinr3 @ Studies/Results: US RENAL  Result Date: 10/05/2020 CLINICAL DATA:  Renal failure. EXAM: RENAL / URINARY TRACT ULTRASOUND COMPLETE COMPARISON:  February 12, 2018. FINDINGS: Right Kidney: Renal measurements: 11.3 x 6.9 x 5.2 cm = volume: 212 mL. Moderate right hydronephrosis is noted. Echogenicity within normal limits. No mass visualized. Left Kidney: Renal measurements: 9.8 x 4.9 x 5.0 cm = volume: 127 mL. Moderate left hydronephrosis is noted. Echogenicity within normal limits. No mass visualized. Bladder:  Appears normal for degree of bladder distention. No ureteral jets are noted. Other: None. IMPRESSION: Moderate bilateral hydronephrosis is noted. CT urogram is recommended to rule out distal ureteral obstruction. Electronically Signed   By: Marijo Conception M.D.   On: 10/05/2020 17:42   ECHOCARDIOGRAM COMPLETE  Result Date: 10/06/2020    ECHOCARDIOGRAM REPORT   Patient Name:   AGNES BRIGHTBILL Date of Exam: 10/06/2020 Medical Rec #:  [id number]       Height:       63.0 in Accession #:    [id number]      Weight:       136.9 lb Date of Birth:  04/26/1931       BSA:          1.646 m Patient Age:    87 years        BP:           154/67 mmHg Patient Gender: F               HR:           83 bpm. Exam Location:  Inpatient Procedure: 2D Echo, 3D Echo, Cardiac Doppler and Color Doppler Indications:    R06.02 SOB  History:        Patient has no prior history of Echocardiogram examinations.                 Signs/Symptoms:Shortness of Breath and Dyspnea; Risk                 Factors:Hypertension and Dyslipidemia. Edema.  Sonographer:    Roseanna Rainbow RDCS Referring Phys: [id number] [place]  1. Left ventricular ejection fraction, by estimation, is 65 to 70%. The left ventricle has hyperdynamic function. The left ventricle has no regional wall motion abnormalities. There is mild concentric left ventricular hypertrophy. Left ventricular diastolic parameters are consistent with Grade I diastolic dysfunction (impaired relaxation).  2. Right ventricular systolic function is normal. The right ventricular size is normal.  3. The mitral valve is normal in structure. Trivial mitral valve regurgitation. No evidence of mitral stenosis.  4. The aortic valve is tricuspid. There is mild calcification of the aortic valve. Aortic valve regurgitation is not visualized. Mild to moderate aortic valve sclerosis/calcification is present, without any evidence of aortic stenosis.  5. The inferior vena cava is normal in size with greater than  50% respiratory variability, suggesting right atrial pressure of 3 mmHg. FINDINGS  Left Ventricle: Left ventricular ejection fraction, by estimation, is 65 to 70%. The left ventricle has hyperdynamic function. The left ventricle has no regional  wall motion abnormalities. 3D left ventricular ejection fraction analysis performed but not  reported based on interpreter judgement due to suboptimal quality. The left ventricular internal cavity size was normal in size. There is mild concentric left ventricular hypertrophy. Left ventricular diastolic parameters are consistent with Grade I diastolic dysfunction (impaired relaxation). Normal left ventricular filling pressure. Right Ventricle: The right ventricular size is normal. No increase in right ventricular wall thickness. Right ventricular systolic function is normal. Left Atrium: Left atrial size was normal in size. Right Atrium: Right atrial size was normal in size. Pericardium: There is no evidence of pericardial effusion. Mitral Valve: The mitral valve is normal in structure. Trivial mitral valve regurgitation. No evidence of mitral valve stenosis. Tricuspid Valve: The tricuspid valve is normal in structure. Tricuspid valve regurgitation is not demonstrated. No evidence of tricuspid stenosis. Aortic Valve: The aortic valve is tricuspid. There is mild calcification of the aortic valve. Aortic valve regurgitation is not visualized. Mild to moderate aortic valve sclerosis/calcification is present, without any evidence of aortic stenosis. Pulmonic Valve: The pulmonic valve was normal in structure. Pulmonic valve regurgitation is not visualized. No evidence of pulmonic stenosis. Aorta: The aortic root is normal in size and structure. Venous: The inferior vena cava is normal in size with greater than 50% respiratory variability, suggesting right atrial pressure of 3 mmHg. IAS/Shunts: No atrial level shunt detected by color flow Doppler.  LEFT VENTRICLE PLAX 2D LVIDd:          3.15 cm     Diastology LVIDs:         2.20 cm     LV e' medial:    4.68 cm/s LV PW:         1.40 cm     LV E/e' medial:  10.3 LV IVS:        1.25 cm     LV e' lateral:   6.53 cm/s LVOT diam:     1.83 cm     LV E/e' lateral: 7.4 LV SV:         62 LV SV Index:   38 LVOT Area:     2.63 cm  LV Volumes (MOD) LV vol d, MOD A2C: 50.0 ml LV vol d, MOD A4C: 57.2 ml LV vol s, MOD A2C: 11.3 ml LV vol s, MOD A4C: 17.4 ml LV SV MOD A2C:     38.7 ml LV SV MOD A4C:     57.2 ml LV SV MOD BP:      39.6 ml RIGHT VENTRICLE RV S prime:     14.50 cm/s TAPSE (M-mode): 2.2 cm LEFT ATRIUM             Index       RIGHT ATRIUM           Index LA diam:        3.20 cm 1.94 cm/m  RA Area:     11.20 cm LA Vol (A2C):   21.8 ml 13.24 ml/m RA Volume:   24.80 ml  15.07 ml/m LA Vol (A4C):   21.8 ml 13.24 ml/m LA Biplane Vol: 23.9 ml 14.52 ml/m  AORTIC VALVE LVOT Vmax:   109.00 cm/s LVOT Vmean:  80.000 cm/s LVOT VTI:    0.236 m  AORTA Ao Root diam: 3.20 cm Ao Asc diam:  3.10 cm MITRAL VALVE MV Area (PHT): 2.42 cm     SHUNTS MV Decel Time: 313 msec     Systemic VTI:  0.24 m MV E velocity:  48.16 cm/s   Systemic Diam: 1.83 cm MV A velocity: 116.00 cm/s MV E/A ratio:  0.42 Mihai Croitoru MD Electronically signed by Sanda Klein MD Signature Date/Time: 10/06/2020/3:59:45 PM    Final    CT RENAL STONE STUDY  Result Date: 10/06/2020 CLINICAL DATA:  Bilateral hydronephrosis. EXAM: CT ABDOMEN AND PELVIS WITHOUT CONTRAST TECHNIQUE: Multidetector CT imaging of the abdomen and pelvis was performed following the standard protocol without IV contrast. COMPARISON:  Oct 04, 2015. FINDINGS: Lower chest: No acute abnormality. Hepatobiliary: No focal liver abnormality is seen. No gallstones, gallbladder wall thickening, or biliary dilatation. Pancreas: Unremarkable. No pancreatic ductal dilatation or surrounding inflammatory changes. Spleen: Normal in size without focal abnormality. Adrenals/Urinary Tract: Adrenal glands appear normal. There is continued  moderate bilateral hydroureteronephrosis with mild perinephric stranding, but no evidence of obstructing calculus. Urinary bladder is decompressed secondary to Foley catheter. Stomach/Bowel: Stomach is within normal limits. Appendix appears normal. No evidence of bowel wall thickening, distention, or inflammatory changes. Vascular/Lymphatic: Aortic atherosclerosis. No enlarged abdominal or pelvic lymph nodes. Reproductive: Uterus and bilateral adnexa are unremarkable. Other: No abdominal wall hernia or abnormality. No abdominopelvic ascites. Musculoskeletal: No acute or significant osseous findings. IMPRESSION: Continued moderate bilateral hydroureteronephrosis is noted with mild perinephric stranding, but no evidence of obstructing calculus. Urinary bladder is decompressed secondary to Foley catheter. Aortic Atherosclerosis (ICD10-I70.0). Electronically Signed   By: Marijo Conception M.D.   On: 10/06/2020 13:35   Medications: . magnesium sulfate bolus IVPB    . sodium chloride (hypertonic) Stopped (10/07/20 0924)   . amLODipine  5 mg Oral Daily  . chlorhexidine  15 mL Mouth Rinse BID  . Chlorhexidine Gluconate Cloth  6 each Topical Daily  . heparin  5,000 Units Subcutaneous Q8H  . mouth rinse  15 mL Mouth Rinse q12n4p  . vitamin B-12  1,000 mcg Oral Daily     Otelia Santee MD [place] [phone]

## 2020-10-07 NOTE — Progress Notes (Addendum)
NAME:  Brenda [name], MRN:  [id number], DOB:  1930-12-20, LOS: 2 ADMISSION DATE:  10/05/2020, CONSULTATION DATE:  10/05/2020 REFERRING MD:  Dr. Roderic Palau, CHIEF COMPLAINT:  Hyponatremia   History of Present Illness:   Patient is a 87 yo female with PMH chronic hyponatremia (baseline NA 127), HTN, HLD, OA, hydronephrosis, and recurrent UTI's of  presenting to [facility] on 10/05/2020 with 2-3 d h/o N&V, mild LE edema and lab work identified by PCP showing severe hyponatremia. Sodium 108 on presentation. Seen by nephro in ED who recommended hypertonic saline. PCCM asked to admit.   Pertinent  Medical History   has a past medical history of Erythematous bladder mucosa, Frequent urination, Gait instability, GERD (gastroesophageal reflux disease), History of basal cell carcinoma excision, History of gout, History of recurrent UTIs, Hydronephrosis, Hyperlipidemia, Hypertension, Nocturia, Numbness and tingling of both legs, OA (osteoarthritis), Urge urinary incontinence, and Wears glasses.   Significant Hospital Events: Including procedures, antibiotic start and stop dates in addition to other pertinent events   . 5/24: patient admitted to [facility] for hyponatremia. Hypertonic saline given. . 5/25: hypertonic held after Na 108 - 117 in 15 hour period. D5W started. . 5/26 sodium back up to 117 at expected rate on 3% at 74mL/hr.   Interim History / Subjective:  No complaints.   Objective   Blood pressure (!) 155/59, pulse 87, temperature 97.7 F (36.5 C), temperature source Oral, resp. rate 19, height 5\' 3"  (1.6 m), weight 62.1 kg, SpO2 97 %.        Intake/Output Summary (Last 24 hours) at 10/07/2020 0831 Last data filed at 10/07/2020 0700 Gross per 24 hour  Intake 1287.5 ml  Output 1425 ml  Net -137.5 ml   Filed Weights   10/05/20 1314 10/05/20 2045 10/06/20 0425  Weight: 60.3 kg 61 kg 62.1 kg    Examination:  General:  Elderly female in NAD Neuro:  Alert, oriented, non-focal HEENT:  [place]/AT, No JVD  noted, PERRL Cardiovascular:  RRR, no MRG Lungs:  Clear bilateral breath sounds Abdomen:  Soft, non-distended, non-tender Musculoskeletal:  No acute deformity or ROM limitation Skin:  Intact, MMM    Labs/imaging that I have personally reviewed  (right click and "Reselect all SmartList Selections" daily)  Na 113 > 114 >115 >116 > 117 on 3% at 16mL/hr   Resolved Hospital Problem list     Assessment & Plan:   Acute on chronic symptomatic hyporosmolar hyponatremia: likely euvolemic but possibly hypovolemic. Initially started on 3%, which was stopped after Na corrected 108-117 in roughly 15 hours. D5W started and NA back to 113. With D5w stopped Na remained 113. 3% restarted at 52mL/hr prompting a safe gradual rise in NA 113 >117 in 16 hrs.   Plan: - Nephrology following, appreciate recommendations.  - Continue 3% at 18mL/hr.  - no DDAVP - Restrict water to 1L/day - Trend BMP/UO  AKI Hyperkalemia improved ? Hx Hunners disease, interstitial cystitis.  Pyuria: large leukocytes. Rare bacteria on a non sterile specimen. Asymptomatic Plan: - Urology has seen. Chronic hydronephrosis since 2017. Maintain foley for now. Will need voiding trial after foley removed. No surgical options indicated.  - Hold off on ABX for now.   Anemia Plan: - Trend CBC - Transfuse for hgb < 7  Hx of HTN Plan: -continue home amlodipine  Hx of Gout Plan: -Allopurinol on hold  Best practice (right click and "Reselect all SmartList Selections" daily)  Diet:  Oral Pain/Anxiety/Delirium protocol (if indicated): No VAP protocol (if  indicated): Not indicated DVT prophylaxis: Subcutaneous Heparin GI prophylaxis: N/A Glucose control:  SSI No Central venous access:  N/A Arterial line:  N/A Foley:  N/A Mobility:  bed rest  PT consulted: N/A Last date of multidisciplinary goals of care discussion []  Code Status:  full code Disposition: ICU   Critical care time:      Georgann Housekeeper,  AGACNP-BC [place] for personal pager PCCM on call pager [phone] until 7pm. Please call Elink 7p-7a. [phone]  10/07/2020 8:31 AM

## 2020-10-08 DIAGNOSIS — E871 Hypo-osmolality and hyponatremia: Secondary | ICD-10-CM | POA: Diagnosis not present

## 2020-10-08 LAB — BASIC METABOLIC PANEL
Anion gap: 7 (ref 5–15)
Anion gap: 12 (ref 5–15)
BUN: 31 mg/dL — ABNORMAL HIGH (ref 8–23)
BUN: 29 mg/dL — ABNORMAL HIGH (ref 8–23)
CO2: 23 mmol/L (ref 22–32)
CO2: 19 mmol/L — ABNORMAL LOW (ref 22–32)
Calcium: 8.1 mg/dL — ABNORMAL LOW (ref 8.9–10.3)
Calcium: 8 mg/dL — ABNORMAL LOW (ref 8.9–10.3)
Chloride: 91 mmol/L — ABNORMAL LOW (ref 98–111)
Chloride: 92 mmol/L — ABNORMAL LOW (ref 98–111)
Creatinine, Ser: 1.2 mg/dL — ABNORMAL HIGH (ref 0.44–1.00)
Creatinine, Ser: 1.4 mg/dL — ABNORMAL HIGH (ref 0.44–1.00)
GFR, Estimated: 44 mL/min — ABNORMAL LOW (ref >60)
GFR, Estimated: 36 mL/min — ABNORMAL LOW (ref >60)
Glucose, Bld: 113 mg/dL — ABNORMAL HIGH (ref 70–99)
Glucose, Bld: 171 mg/dL — ABNORMAL HIGH (ref 70–99)
Potassium: 4 mmol/L (ref 3.5–5.1)
Potassium: 3.7 mmol/L (ref 3.5–5.1)
Sodium: 121 mmol/L — ABNORMAL LOW (ref 135–145)
Sodium: 123 mmol/L — ABNORMAL LOW (ref 135–145)

## 2020-10-08 LAB — CBC
HCT: 25.5 % — ABNORMAL LOW (ref 36.0–46.0)
Hemoglobin: 8.6 g/dL — ABNORMAL LOW (ref 12.0–15.0)
MCH: 30.8 pg (ref 26.0–34.0)
MCHC: 33.7 g/dL (ref 30.0–36.0)
MCV: 91.4 fL (ref 80.0–100.0)
Platelets: 470 10*3/uL — ABNORMAL HIGH (ref 150–400)
RBC: 2.79 MIL/uL — ABNORMAL LOW (ref 3.87–5.11)
RDW: 13.9 % (ref 11.5–15.5)
WBC: 9.7 10*3/uL (ref 4.0–10.5)
nRBC: 0 % (ref 0.0–0.2)

## 2020-10-08 MED ORDER — FUROSEMIDE 10 MG/ML IJ SOLN
20.0000 mg | Freq: Once | INTRAMUSCULAR | Status: AC
  Administered 2020-10-08: 20 mg via INTRAVENOUS
  Filled 2020-10-08: qty 2

## 2020-10-08 NOTE — Evaluation (Signed)
Occupational Therapy Evaluation Patient Details Name: Brenda [name] MRN: [id number] DOB: 18-Aug-1930 Today's Date: 10/08/2020    History of Present Illness Pt c/o vomiting and loose stool and sent to ED from PCP 2* low sodium.  Pt with hx of hydonephritis, gout cervical fusion and thoracic laminectomy.   Clinical Impression   Brenda [name] is an 87 year old woman admitted to hospital due to hyponatremia. On evaluation patient presents with impaired balance, generalized weakness and decreased activity tolerance resulting in a decline in functional abilities. Patient does have some assistance for LB dressing at home and has supervision of daughter. Today patient needing more assistance for ADLs and unsteady. Patient will benefit from skilled OT services while in hospital to improve deficits and learn compensatory strategies as needed in order to return PLOF.  I expect patient won't need OT services at discharge.    Follow Up Recommendations  No OT follow up    Equipment Recommendations  None recommended by OT    Recommendations for Other Services       Precautions / Restrictions Precautions Precautions: Fall Restrictions Weight Bearing Restrictions: No      Mobility Bed Mobility Overal bed mobility: Needs Assistance Bed Mobility: Supine to Sit     Supine to sit: Min guard;HOB elevated     General bed mobility comments: Up in chair and requests back to same    Transfers Overall transfer level: Needs assistance Equipment used: Rolling walker (2 wheeled) Transfers: Sit to/from [facility] Sit to Stand: Min guard Stand pivot transfers: Min guard       General transfer comment: min guard to ambulate in room wtih RW. No overt loss of balance but gait somewhat shuffling.    Balance Overall balance assessment: Needs assistance Sitting-balance support: No upper extremity supported Sitting balance-Leahy Scale: Good     Standing balance support:  Bilateral upper extremity supported Standing balance-Leahy Scale: Poor Standing balance comment: reliant on walker                           ADL either performed or assessed with clinical judgement   ADL Overall ADL's : Needs assistance/impaired Eating/Feeding: Maximal assistance Eating/Feeding Details (indicate cue type and reason): daughter feeding patient - reports hands swollen and stiff and patient worried about dropping food Grooming: Standing;Wash/dry face;Wash/dry hands Grooming Details (indicate cue type and reason): soot Upper Body Bathing: Set up;Sitting   Lower Body Bathing: Minimal assistance;Sit to/from stand   Upper Body Dressing : Set up;Sitting   Lower Body Dressing: Moderate assistance;Sit to/from stand   Toilet Transfer: Tour manager Toilet;Grab bars   Toileting- Water quality scientist and Hygiene: Min guard;Sit to/from stand       Functional mobility during ADLs: Min guard;Rolling walker       Vision Patient Visual Report: No change from baseline       Perception     Praxis      Pertinent Vitals/Pain Pain Assessment: Faces Faces Pain Scale: Hurts a little bit Pain Location: bilateral hands Pain Descriptors / Indicators: Aching Pain Intervention(s): Limited activity within patient's tolerance     Hand Dominance Right   Extremity/Trunk Assessment Upper Extremity Assessment Upper Extremity Assessment: Overall WFL for tasks assessed   Lower Extremity Assessment Lower Extremity Assessment: Defer to PT evaluation   Cervical / Trunk Assessment Cervical / Trunk Assessment: Kyphotic   Communication Communication Communication: No difficulties   Cognition Arousal/Alertness: Awake/alert Behavior During Therapy: WFL for tasks  assessed/performed Overall Cognitive Status: Within Functional Limits for tasks assessed                                     General Comments       Exercises     Shoulder Instructions       Home Living Family/patient expects to be discharged to:: Private residence Living Arrangements: Children Available Help at Discharge: Family;Available 24 hours/day Type of Home: House Home Access: Stairs to enter;Ramped entrance Entrance Stairs-Number of Steps: 5 Entrance Stairs-Rails: Right Home Layout: Able to live on main level with bedroom/bathroom     Bathroom Shower/Tub: Teacher, early years/pre: Standard     Home Equipment: Environmental consultant - 4 wheels;Shower seat          Prior Functioning/Environment Level of Independence: Needs assistance  Gait / Transfers Assistance Needed: walks with walker, walks alone in house, walks in church parking lot for exercise ADL's / Homemaking Assistance Needed: daughter helps with shoes, lots of supervision for ADLs            OT Problem List: Decreased strength;Impaired balance (sitting and/or standing)      OT Treatment/Interventions: Self-care/ADL training;Therapeutic exercise;DME and/or AE instruction;Therapeutic activities;Balance training;Patient/family education    OT Goals(Current goals can be found in the care plan section) Acute Rehab OT Goals Patient Stated Goal: Regain IND OT Goal Formulation: With patient Time For Goal Achievement: 10/22/20 Potential to Achieve Goals: Good  OT Frequency: Min 2X/week   Barriers to D/C:            Co-evaluation              AM-PAC OT "6 Clicks" Daily Activity     Outcome Measure Help from another person eating meals?: A Lot Help from another person taking care of personal grooming?: A Little Help from another person toileting, which includes using toliet, bedpan, or urinal?: A Little Help from another person bathing (including washing, rinsing, drying)?: A Little Help from another person to put on and taking off regular upper body clothing?: A Little Help from another person to put on and taking off regular lower body clothing?: A Lot 6 Click Score: 16   End of  Session Equipment Utilized During Treatment: Rolling walker;Gait belt Nurse Communication: Mobility status  Activity Tolerance: Patient tolerated treatment well Patient left: in chair;with call bell/phone within reach;with family/visitor present  OT Visit Diagnosis: Unsteadiness on feet (R26.81)                Time: 9449-6759 OT Time Calculation (min): 34 min Charges:  OT General Charges $OT Visit: 1 Visit OT Evaluation $OT Eval Low Complexity: 1 Low OT Treatments $Self Care/Home Management : 8-22 mins  Kasai Beltran, OTR/L [place]  Office [phone] Pager: [place] 10/08/2020, 12:41 PM

## 2020-10-08 NOTE — Progress Notes (Signed)
Marland Kitchen  [facility] Progress Note   Assessment/Plan: 1. Hyponatremia - unclear etiology but certainly nausea is a very strong stimulant of vasopressin release and is playing a role though her 5/24 urine osm 262, serum osm 242 show not a strong component of ADH. Her lower extremity edema is a result of Amlodipine and unlikely to be from CHF.  Marland Kitchen By history doesn't appear to be from poor solute intake which can cause a reset osmostat; she had  poor oral intake after the nausea appeared Sunday.  TSH normal, cortisol 8.8 collected at 830pm.  BP has been high/normal.  - Off 3% NS on 5/26 and SNa 121.  - Started on  NaCl tablets; will add Lasix 20mg  IV x1 now and reassess in am. Her SNa  was 130 when she was d/c from the [facility] hospital  - Cont water restrict to 1L / day. - Strict I&O's.  2. Hyperkalemia may be related to AKI +/- CKD:  Resolved with med mgmt.  Cont  To follow 3. AKI or CKD? - Renal US with bilateral moderate hydronephrosis.  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 Tresa Moore but last eval preCOVID.  Consulted urology who rec ct stone protocol.  Currently with  Foley; will need bladder voiding trial when foley is removed. 4. HTN - resume amlodipine. She was recently taken off Lisinopril likely because of AKI + hyperkalemia. 5. Gout - hold allopurinol for now.  Subjective:   eval with daughter bedside - improved today.  No further nausea or emesis; no dizziness or confusion.  No dyspnea.  Out of bed to chair and walked with OT.   Objective Vitals:   10/08/20 0408 10/08/20 0500 10/08/20 0800 10/08/20 0958  BP: 134/66   (!) 123/49  Pulse: 76     Resp: 18  15   Temp: 98.3 F (36.8 C)     TempSrc: Oral     SpO2: 100%     Weight:  60.2 kg    Height:  5\' 3"  (1.6 m)    I/Os yesterday:  570 / 1050 Physical Exam General: elderly woman alert in chair Heart: RRR Lungs: normal WOB on RA Abdomen: soft,  nondistended Extremities: no edema Neuro:  Conversant, nonfocal; hard of hearing   Additional Objective Labs: Basic Metabolic Panel: Recent Labs  Lab 10/05/20 1335 10/05/20 1714 10/07/20 0431 10/07/20 0906 10/07/20 1805 10/07/20 2027 10/08/20 0031  NA 108*   < > 117*   < > 121* 122* 121*  K 5.5*   < > 4.1   < > 4.3 4.0 4.0  CL 78*   < > 88*   < > 89* 92* 91*  CO2 21*   < > 20*   < > 23 21* 23  GLUCOSE 114*   < > 96   < > 129* 133* 113*  BUN 31*   < > 32*   < > 28* 31* 31*  CREATININE 1.50*   < > 1.28*   < > 1.30* 1.33* 1.20*  CALCIUM 8.7*   < > 8.2*   < > 8.4* 8.2* 8.1*  PHOS 3.7  --  3.7  --   --   --   --    < > = values in this interval not displayed.   Liver Function Tests: No results for input(s): AST, ALT, ALKPHOS, BILITOT, PROT, ALBUMIN in the last 168 hours. No results for input(s): LIPASE, AMYLASE in the  last 168 hours. CBC: Recent Labs  Lab 10/05/20 1335 10/06/20 0419 10/07/20 0431 10/08/20 0031  WBC 10.1 8.4 9.7 9.7  NEUTROABS 6.8  --   --   --   HGB 9.3* 8.8* 9.0* 8.6*  HCT 26.7* 25.3* 26.4* 25.5*  MCV 88.1 89.4 89.8 91.4  PLT 500* 409* 439* 470*   Blood Culture    Component Value Date/Time   SDES URINE, CLEAN CATCH 12/12/2014 1940   SPECREQUEST ADDED 2202 12/12/2014 1940   CULT MULTIPLE SPECIES PRESENT, SUGGEST RECOLLECTION 12/12/2014 1940   REPTSTATUS 12/14/2014 FINAL 12/12/2014 1940    Cardiac Enzymes: No results for input(s): CKTOTAL, CKMB, CKMBINDEX, TROPONINI in the last 168 hours. CBG: No results for input(s): GLUCAP in the last 168 hours. Iron Studies: No results for input(s): IRON, TIBC, TRANSFERRIN, FERRITIN in the last 72 hours. @lablastinr3 @ Studies/Results: ECHOCARDIOGRAM COMPLETE  Result Date: 10/06/2020    ECHOCARDIOGRAM REPORT   Patient Name:   Brenda [name] Date of Exam: 10/06/2020 Medical Rec #:  [id number]       Height:       63.0 in Accession #:    [id number]      Weight:       136.9 lb Date of Birth:  [birth year]       BSA:           1.646 m Patient Age:    87 years        BP:           154/67 mmHg Patient Gender: F               HR:           83 bpm. Exam Location:  Inpatient Procedure: 2D Echo, 3D Echo, Cardiac Doppler and Color Doppler Indications:    R06.02 SOB  History:        Patient has no prior history of Echocardiogram examinations.                 Signs/Symptoms:Shortness of Breath and Dyspnea; Risk                 Factors:Hypertension and Dyslipidemia. Edema.  Sonographer:    Roseanna Rainbow RDCS Referring Phys: [id number] [place]  1. Left ventricular ejection fraction, by estimation, is 65 to 70%. The left ventricle has hyperdynamic function. The left ventricle has no regional wall motion abnormalities. There is mild concentric left ventricular hypertrophy. Left ventricular diastolic parameters are consistent with Grade I diastolic dysfunction (impaired relaxation).  2. Right ventricular systolic function is normal. The right ventricular size is normal.  3. The mitral valve is normal in structure. Trivial mitral valve regurgitation. No evidence of mitral stenosis.  4. The aortic valve is tricuspid. There is mild calcification of the aortic valve. Aortic valve regurgitation is not visualized. Mild to moderate aortic valve sclerosis/calcification is present, without any evidence of aortic stenosis.  5. The inferior vena cava is normal in size with greater than 50% respiratory variability, suggesting right atrial pressure of 3 mmHg. FINDINGS  Left Ventricle: Left ventricular ejection fraction, by estimation, is 65 to 70%. The left ventricle has hyperdynamic function. The left ventricle has no regional wall motion abnormalities. 3D left ventricular ejection fraction analysis performed but not  reported based on interpreter judgement due to suboptimal quality. The left ventricular internal cavity size was normal in size. There is mild concentric left ventricular hypertrophy. Left ventricular diastolic parameters are  consistent with Grade I diastolic dysfunction (impaired  relaxation). Normal left ventricular filling pressure. Right Ventricle: The right ventricular size is normal. No increase in right ventricular wall thickness. Right ventricular systolic function is normal. Left Atrium: Left atrial size was normal in size. Right Atrium: Right atrial size was normal in size. Pericardium: There is no evidence of pericardial effusion. Mitral Valve: The mitral valve is normal in structure. Trivial mitral valve regurgitation. No evidence of mitral valve stenosis. Tricuspid Valve: The tricuspid valve is normal in structure. Tricuspid valve regurgitation is not demonstrated. No evidence of tricuspid stenosis. Aortic Valve: The aortic valve is tricuspid. There is mild calcification of the aortic valve. Aortic valve regurgitation is not visualized. Mild to moderate aortic valve sclerosis/calcification is present, without any evidence of aortic stenosis. Pulmonic Valve: The pulmonic valve was normal in structure. Pulmonic valve regurgitation is not visualized. No evidence of pulmonic stenosis. Aorta: The aortic root is normal in size and structure. Venous: The inferior vena cava is normal in size with greater than 50% respiratory variability, suggesting right atrial pressure of 3 mmHg. IAS/Shunts: No atrial level shunt detected by color flow Doppler.  LEFT VENTRICLE PLAX 2D LVIDd:         3.15 cm     Diastology LVIDs:         2.20 cm     LV e' medial:    4.68 cm/s LV PW:         1.40 cm     LV E/e' medial:  10.3 LV IVS:        1.25 cm     LV e' lateral:   6.53 cm/s LVOT diam:     1.83 cm     LV E/e' lateral: 7.4 LV SV:         62 LV SV Index:   38 LVOT Area:     2.63 cm  LV Volumes (MOD) LV vol d, MOD A2C: 50.0 ml LV vol d, MOD A4C: 57.2 ml LV vol s, MOD A2C: 11.3 ml LV vol s, MOD A4C: 17.4 ml LV SV MOD A2C:     38.7 ml LV SV MOD A4C:     57.2 ml LV SV MOD BP:      39.6 ml RIGHT VENTRICLE RV S prime:     14.50 cm/s TAPSE (M-mode): 2.2 cm  LEFT ATRIUM             Index       RIGHT ATRIUM           Index LA diam:        3.20 cm 1.94 cm/m  RA Area:     11.20 cm LA Vol (A2C):   21.8 ml 13.24 ml/m RA Volume:   24.80 ml  15.07 ml/m LA Vol (A4C):   21.8 ml 13.24 ml/m LA Biplane Vol: 23.9 ml 14.52 ml/m  AORTIC VALVE LVOT Vmax:   109.00 cm/s LVOT Vmean:  80.000 cm/s LVOT VTI:    0.236 m  AORTA Ao Root diam: 3.20 cm Ao Asc diam:  3.10 cm MITRAL VALVE MV Area (PHT): 2.42 cm     SHUNTS MV Decel Time: 313 msec     Systemic VTI:  0.24 m MV E velocity: 48.16 cm/s   Systemic Diam: 1.83 cm MV A velocity: 116.00 cm/s MV E/A ratio:  0.42 Mihai Croitoru MD Electronically signed by Sanda Klein MD Signature Date/Time: 10/06/2020/3:59:45 PM    Final    CT RENAL STONE STUDY  Result Date: 10/06/2020 CLINICAL DATA:  Bilateral hydronephrosis.  EXAM: CT ABDOMEN AND PELVIS WITHOUT CONTRAST TECHNIQUE: Multidetector CT imaging of the abdomen and pelvis was performed following the standard protocol without IV contrast. COMPARISON:  Oct 04, 2015. FINDINGS: Lower chest: No acute abnormality. Hepatobiliary: No focal liver abnormality is seen. No gallstones, gallbladder wall thickening, or biliary dilatation. Pancreas: Unremarkable. No pancreatic ductal dilatation or surrounding inflammatory changes. Spleen: Normal in size without focal abnormality. Adrenals/Urinary Tract: Adrenal glands appear normal. There is continued moderate bilateral hydroureteronephrosis with mild perinephric stranding, but no evidence of obstructing calculus. Urinary bladder is decompressed secondary to Foley catheter. Stomach/Bowel: Stomach is within normal limits. Appendix appears normal. No evidence of bowel wall thickening, distention, or inflammatory changes. Vascular/Lymphatic: Aortic atherosclerosis. No enlarged abdominal or pelvic lymph nodes. Reproductive: Uterus and bilateral adnexa are unremarkable. Other: No abdominal wall hernia or abnormality. No abdominopelvic ascites.  Musculoskeletal: No acute or significant osseous findings. IMPRESSION: Continued moderate bilateral hydroureteronephrosis is noted with mild perinephric stranding, but no evidence of obstructing calculus. Urinary bladder is decompressed secondary to Foley catheter. Aortic Atherosclerosis (ICD10-I70.0). Electronically Signed   By: Marijo Conception M.D.   On: 10/06/2020 13:35   Medications:  . amLODipine  5 mg Oral Daily  . chlorhexidine  15 mL Mouth Rinse BID  . Chlorhexidine Gluconate Cloth  6 each Topical Daily  . heparin  5,000 Units Subcutaneous Q8H  . mouth rinse  15 mL Mouth Rinse q12n4p  . sodium chloride  1 g Oral BID WC  . vitamin B-12  1,000 mcg Oral Daily     Otelia Santee MD 10/08/2020, 11:32 AM  [facility] [phone]

## 2020-10-08 NOTE — Progress Notes (Signed)
PROGRESS NOTE  Brenda [name] FTD:[id number] DOB: 1930/12/26 DOA: 10/05/2020 PCP: Burnard Bunting, MD  HPI/Recap of past 24 hours: Patient is a 87 yo female with PMH chronic hyponatremia (baseline NA 127), HTN, HLD, OA, hydronephrosis, and recurrent UTI's of  presenting to [facility] on 10/05/2020 with 2-3 d h/o N&V, mild LE edema and lab work identified by PCP showing severe hyponatremia. Sodium 108 on presentation. Seen by nephro in ED who recommended hypertonic saline. PCCM asked to admit.   Pertinent  Medical History   has a past medical history of Erythematous bladder mucosa, Frequent urination, Gait instability, GERD (gastroesophageal reflux disease), History of basal cell carcinoma excision, History of gout, History of recurrent UTIs, Hydronephrosis, Hyperlipidemia, Hypertension, Nocturia, Numbness and tingling of both legs, OA (osteoarthritis), Urge urinary incontinence, and Wears glasses.  Transferred to [facility] on 10/08/2020.  10/08/20: Patient was seen and examined with her daughter at bedside.  She is alert and interactive.  States she feels a little weak but better.  Serum sodium uptrending.  She was started on salt tablets.  Assessment/Plan: Active Problems:   Hyponatremia  Acute on chronic symptomatic hyporosmolar hyponatremia: likely euvolemic but possibly hypovolemic. Initially started on 3%, which was stopped after Na corrected 108-117 in roughly 15 hours. D5W started and NA back to 113. With D5w stopped Na remained 113. 3% restarted at 31mL/hr prompting a safe gradual rise in NA 113 >117 in 16 hrs.   Plan: - She is off 3% at 109mL/hr.  - no DDAVP - Restrict water to 1L/day - Trend BMP/UO -She was started on salt tablets 1 g twice daily on 10/07/2020, continue -Seen by nephrology, appreciate assistance. -We will plan to discharge home with serum sodium of 130. -Currently serum sodium 121 -Repeat BMP in the morning  AKI Hyperkalemia, resolved ? Hx Hunners disease, interstitial  cystitis.  Pyuria: large leukocytes. Rare bacteria on a non sterile specimen. Asymptomatic Plan: - Urology has seen. Chronic hydronephrosis since 2017. Maintain foley for now. Will need voiding trial after foley removed. No surgical options indicated.  - Hold off on ABX for now.   Chronic normocytic anemia Unclear etiology External hemorrhoids from colonoscopy done in 2004. No reported overt bleeding. Plan: - Trend CBC - Transfuse for hgb < 7  Essential hypertension BP is at goal Continue home amlodipine Continue to monitor vital signs  Gout Stable -Allopurinol on hold  Generalized weakness PT OT to assess Fall precautions  Code Status: Full code.  Family Communication: Daughter at bedside.  Disposition Plan: Likely to home with home health PT in the next 24 to 48 hours   Consultants:  Nephrology  PCCM  Procedures:  None.  Antimicrobials:  None.  DVT prophylaxis: Subcu heparin 3 times daily.  Status is: Inpatient    Dispo:  Patient From: Home  Planned Disposition: Home possibly on 10/09/2020 once serum sodium is at least 130.  Medically stable for discharge: No          Objective: Vitals:   10/08/20 1100 10/08/20 1200 10/08/20 1300 10/08/20 1336  BP:    (!) 123/55  Pulse:    92  Resp: 17 (!) 21 15 16   Temp:    98 F (36.7 C)  TempSrc:    Oral  SpO2:    95%  Weight:      Height:        Intake/Output Summary (Last 24 hours) at 10/08/2020 1430 Last data filed at 10/08/2020 1300 Gross per 24 hour  Intake 213.63  ml  Output 1500 ml  Net -1286.37 ml   Filed Weights   10/05/20 2045 10/06/20 0425 10/08/20 0500  Weight: 61 kg 62.1 kg 60.2 kg    Exam:  . General: 87 y.o. year-old female frail-appearing in no acute distress.  Alert and interactive. . Cardiovascular: Regular rate and rhythm with no rubs or gallops.  No thyromegaly or JVD noted.   Marland Kitchen Respiratory: Clear to auscultation with no wheezes or rales. Good inspiratory  effort. . Abdomen: Soft nontender nondistended with normal bowel sounds x4 quadrants. . Musculoskeletal: No lower extremity edema.  . Skin: No ulcerative lesions noted or rashes, . Psychiatry: Mood is appropriate for condition and setting   Data Reviewed: CBC: Recent Labs  Lab 10/05/20 1335 10/06/20 0419 10/07/20 0431 10/08/20 0031  WBC 10.1 8.4 9.7 9.7  NEUTROABS 6.8  --   --   --   HGB 9.3* 8.8* 9.0* 8.6*  HCT 26.7* 25.3* 26.4* 25.5*  MCV 88.1 89.4 89.8 91.4  PLT 500* 409* 439* 188*   Basic Metabolic Panel: Recent Labs  Lab 10/05/20 1335 10/05/20 1714 10/07/20 0431 10/07/20 0906 10/07/20 1212 10/07/20 1629 10/07/20 1805 10/07/20 2027 10/08/20 0031  NA 108*   < > 117*   < > 119* 121* 121* 122* 121*  K 5.5*   < > 4.1   < > 4.0 4.3 4.3 4.0 4.0  CL 78*   < > 88*   < > 89* 90* 89* 92* 91*  CO2 21*   < > 20*   < > 22 24 23  21* 23  GLUCOSE 114*   < > 96   < > 144* 126* 129* 133* 113*  BUN 31*   < > 32*   < > 27* 28* 28* 31* 31*  CREATININE 1.50*   < > 1.28*   < > 1.20* 1.36* 1.30* 1.33* 1.20*  CALCIUM 8.7*   < > 8.2*   < > 8.0* 8.3* 8.4* 8.2* 8.1*  MG 1.7  --  1.5*  --   --   --   --   --   --   PHOS 3.7  --  3.7  --   --   --   --   --   --    < > = values in this interval not displayed.   GFR: Estimated Creatinine Clearance: 27.3 mL/min (A) (by C-G formula based on SCr of 1.2 mg/dL (H)). Liver Function Tests: No results for input(s): AST, ALT, ALKPHOS, BILITOT, PROT, ALBUMIN in the last 168 hours. No results for input(s): LIPASE, AMYLASE in the last 168 hours. No results for input(s): AMMONIA in the last 168 hours. Coagulation Profile: No results for input(s): INR, PROTIME in the last 168 hours. Cardiac Enzymes: No results for input(s): CKTOTAL, CKMB, CKMBINDEX, TROPONINI in the last 168 hours. BNP (last 3 results) No results for input(s): PROBNP in the last 8760 hours. HbA1C: No results for input(s): HGBA1C in the last 72 hours. CBG: No results for input(s):  GLUCAP in the last 168 hours. Lipid Profile: Recent Labs    10/05/20 1714  CHOL 115  HDL 49  LDLCALC 57  TRIG 45  CHOLHDL 2.3   Thyroid Function Tests: Recent Labs    10/05/20 2029  TSH 1.100   Anemia Panel: No results for input(s): VITAMINB12, FOLATE, FERRITIN, TIBC, IRON, RETICCTPCT in the last 72 hours. Urine analysis:    Component Value Date/Time   COLORURINE YELLOW 10/07/2020 1034   APPEARANCEUR TURBID (A)  10/07/2020 1034   LABSPEC 1.008 10/07/2020 1034   PHURINE 6.0 10/07/2020 1034   GLUCOSEU 50 (A) 10/07/2020 1034   HGBUR MODERATE (A) 10/07/2020 1034   BILIRUBINUR NEGATIVE 10/07/2020 1034   KETONESUR NEGATIVE 10/07/2020 1034   PROTEINUR 100 (A) 10/07/2020 1034   UROBILINOGEN 0.2 12/12/2014 1940   NITRITE NEGATIVE 10/07/2020 1034   LEUKOCYTESUR LARGE (A) 10/07/2020 1034   Sepsis Labs: @LABRCNTIP (procalcitonin:4,lacticidven:4)  ) Recent Results (from the past 240 hour(s))  Resp Panel by RT-PCR (Flu A&B, Covid) Nasopharyngeal Swab     Status: None   Collection Time: 10/05/20  2:34 PM   Specimen: Nasopharyngeal Swab; Nasopharyngeal(NP) swabs in vial transport medium  Result Value Ref Range Status   SARS Coronavirus 2 by RT PCR NEGATIVE NEGATIVE Final    Comment: (NOTE) SARS-CoV-2 target nucleic acids are NOT DETECTED.  The SARS-CoV-2 RNA is generally detectable in upper respiratory specimens during the acute phase of infection. The lowest concentration of SARS-CoV-2 viral copies this assay can detect is 138 copies/mL. A negative result does not preclude SARS-Cov-2 infection and should not be used as the sole basis for treatment or other patient management decisions. A negative result may occur with  improper specimen collection/handling, submission of specimen other than nasopharyngeal swab, presence of viral mutation(s) within the areas targeted by this assay, and inadequate number of viral copies(<138 copies/mL). A negative result must be combined  with clinical observations, patient history, and epidemiological information. The expected result is Negative.  Fact Sheet for Patients:  EntrepreneurPulse.com.au  Fact Sheet for Healthcare Providers:  IncredibleEmployment.be  This test is no t yet approved or cleared by the [place] FDA and  has been authorized for detection and/or diagnosis of SARS-CoV-2 by FDA under an Emergency Use Authorization (EUA). This EUA will remain  in effect (meaning this test can be used) for the duration of the COVID-19 declaration under Section 564(b)(1) of the Act, 21 U.S.C.section 360bbb-3(b)(1), unless the authorization is terminated  or revoked sooner.       Influenza A by PCR NEGATIVE NEGATIVE Final   Influenza B by PCR NEGATIVE NEGATIVE Final    Comment: (NOTE) The Xpert Xpress SARS-CoV-2/FLU/RSV plus assay is intended as an aid in the diagnosis of influenza from Nasopharyngeal swab specimens and should not be used as a sole basis for treatment. Nasal washings and aspirates are unacceptable for Xpert Xpress SARS-CoV-2/FLU/RSV testing.  Fact Sheet for Patients: EntrepreneurPulse.com.au  Fact Sheet for Healthcare Providers: IncredibleEmployment.be  This test is not yet approved or cleared by the [place] FDA and has been authorized for detection and/or diagnosis of SARS-CoV-2 by FDA under an Emergency Use Authorization (EUA). This EUA will remain in effect (meaning this test can be used) for the duration of the COVID-19 declaration under Section 564(b)(1) of the Act, 21 U.S.C. section 360bbb-3(b)(1), unless the authorization is terminated or revoked.  Performed at [facility], [place] [address], [place], [place] [address]   MRSA PCR Screening     Status: None   Collection Time: 10/05/20  7:38 PM   Specimen: Nasal Mucosa; Nasopharyngeal  Result Value Ref Range Status   MRSA by PCR  NEGATIVE NEGATIVE Final    Comment:        The GeneXpert MRSA Assay (FDA approved for NASAL specimens only), is one component of a comprehensive MRSA colonization surveillance program. It is not intended to diagnose MRSA infection nor to guide or monitor treatment for MRSA infections. Performed at [facility], [address] [place], [place] [address]   Culture, Urine     Status: Abnormal (Preliminary result)   Collection Time: 10/07/20 12:54 PM   Specimen: Urine, Catheterized  Result Value Ref Range Status   Specimen Description   Final    URINE, CATHETERIZED Performed at [facility], [place] [address], [place], [place] [address]    Special Requests   Final    NONE Performed at [facility], [place] [address], [place], [place] [address]    Culture (A)  Final    >=100,000 COLONIES/mL GRAM NEGATIVE RODS IDENTIFICATION AND SUSCEPTIBILITIES TO FOLLOW Performed at [facility], [place] [address], [place],  [address]    Report Status PENDING  Incomplete      Studies: No results found.  Scheduled Meds: . amLODipine  5 mg Oral Daily  . chlorhexidine  15 mL Mouth Rinse BID  . Chlorhexidine Gluconate Cloth  6 each Topical Daily  . heparin  5,000 Units Subcutaneous Q8H  . mouth rinse  15 mL Mouth Rinse q12n4p  . sodium chloride  1 g Oral BID WC  . vitamin B-12  1,000 mcg Oral Daily    Continuous Infusions:   LOS: 3 days     Kayleen Memos, MD [facility] Pager [phone]  If 7PM-7AM, please contact night-coverage [email or web address] Password [email or web address] 10/08/2020, 2:30 PM

## 2020-10-08 NOTE — Evaluation (Signed)
Physical Therapy Evaluation Patient Details Name: Brenda [name] MRN: [id number] DOB: 1931-02-23 Today's Date: 10/08/2020   History of Present Illness  Pt c/o vomiting and loose stool and sent to ED from PCP 2* low sodium.  Pt with hx of hydonephritis, gout cervical fusion and thoracic laminectomy.  Clinical Impression  Pt admitted as above and presenting with functional mobility limitations 2* generalized weakness, limited endurance, and ambulatory balance deficits.  Pt should progress to dc home with family assist.    Follow Up Recommendations Home health PT    Equipment Recommendations  None recommended by PT    Recommendations for Other Services       Precautions / Restrictions Precautions Precautions: Fall Restrictions Weight Bearing Restrictions: No      Mobility  Bed Mobility               General bed mobility comments: Up in chair and requests back to same    Transfers Overall transfer level: Needs assistance Equipment used: Rolling walker (2 wheeled) Transfers: Sit to/from Stand Sit to Stand: Min assist         General transfer comment: cues for use of UEs to self assist.  Physical assist to bring wt up and fwd and to balance in intial standing  Ambulation/Gait Ambulation/Gait assistance: Min assist Gait Distance (Feet): 148 Feet Assistive device: Rolling walker (2 wheeled) Gait Pattern/deviations: Step-through pattern;Decreased step length - right;Decreased step length - left;Shuffle;Trunk flexed     General Gait Details: cues for posture and position from RW; assist for balance/support and RW management  Stairs            Wheelchair Mobility    Modified Rankin (Stroke Patients Only)       Balance Overall balance assessment: Needs assistance Sitting-balance support: No upper extremity supported;Feet supported Sitting balance-Leahy Scale: Good     Standing balance support: Bilateral upper extremity supported Standing  balance-Leahy Scale: Poor                               Pertinent Vitals/Pain Pain Assessment: No/denies pain    Home Living Family/patient expects to be discharged to:: Private residence Living Arrangements: Children Available Help at Discharge: Family;Available 24 hours/day Type of Home: House Home Access: Stairs to enter;Ramped entrance Entrance Stairs-Rails: Right Entrance Stairs-Number of Steps: 5 Home Layout: Able to live on main level with bedroom/bathroom Home Equipment: Walker - 4 wheels;Shower seat      Prior Function Level of Independence: Needs assistance   Gait / Transfers Assistance Needed: walks with walker, walks alone in house, walks in church parking lot for exercise  ADL's / Homemaking Assistance Needed: daughter helps with shoes, lots of supervision for ADLs        Hand Dominance   Dominant Hand: Right    Extremity/Trunk Assessment   Upper Extremity Assessment Upper Extremity Assessment: Defer to OT evaluation    Lower Extremity Assessment Lower Extremity Assessment: Generalized weakness    Cervical / Trunk Assessment Cervical / Trunk Assessment: Kyphotic  Communication   Communication: No difficulties  Cognition Arousal/Alertness: Awake/alert Behavior During Therapy: WFL for tasks assessed/performed Overall Cognitive Status: Within Functional Limits for tasks assessed                                        General Comments  Exercises     Assessment/Plan    PT Assessment Patient needs continued PT services  PT Problem List Decreased strength;Decreased activity tolerance;Decreased balance;Decreased mobility;Decreased knowledge of use of DME       PT Treatment Interventions DME instruction;Gait training;Stair training;Functional mobility training;Therapeutic activities;Therapeutic exercise;Balance training;Patient/family education    PT Goals (Current goals can be found in the Care Plan section)   Acute Rehab PT Goals Patient Stated Goal: Regain IND PT Goal Formulation: With patient Time For Goal Achievement: 10/22/20 Potential to Achieve Goals: Good    Frequency Min 3X/week   Barriers to discharge        Co-evaluation               AM-PAC PT "6 Clicks" Mobility  Outcome Measure Help needed turning from your back to your side while in a flat bed without using bedrails?: A Little Help needed moving from lying on your back to sitting on the side of a flat bed without using bedrails?: A Little Help needed moving to and from a bed to a chair (including a wheelchair)?: A Little Help needed standing up from a chair using your arms (e.g., wheelchair or bedside chair)?: A Little Help needed to walk in hospital room?: A Little Help needed climbing 3-5 steps with a railing? : A Lot 6 Click Score: 17    End of Session Equipment Utilized During Treatment: Gait belt Activity Tolerance: Patient tolerated treatment well;Patient limited by fatigue Patient left: in chair;with call bell/phone within reach;with family/visitor present Nurse Communication: Mobility status PT Visit Diagnosis: Difficulty in walking, not elsewhere classified (R26.2);Muscle weakness (generalized) (M62.81)    Time: 2947-6546 PT Time Calculation (min) (ACUTE ONLY): 21 min   Charges:   PT Evaluation $PT Eval Low Complexity: 1 Low          [place] Pager [phone] Office [phone]   Shakir Petrosino 10/08/2020, 12:01 PM

## 2020-10-08 NOTE — Care Management Important Message (Signed)
Important Message  Patient Details IM Letter given to the Patient. Name: Brenda [name] MRN: [id number] Date of Birth: [birth year]   Medicare Important Message Given:  Yes     Kerin Salen 10/08/2020, 3:05 PM

## 2020-10-09 DIAGNOSIS — E871 Hypo-osmolality and hyponatremia: Secondary | ICD-10-CM | POA: Diagnosis not present

## 2020-10-09 LAB — URINE CULTURE: Culture: >=100000 — AB

## 2020-10-09 LAB — BASIC METABOLIC PANEL
Anion gap: 8 (ref 5–15)
BUN: 31 mg/dL — ABNORMAL HIGH (ref 8–23)
CO2: 21 mmol/L — ABNORMAL LOW (ref 22–32)
Calcium: 8.2 mg/dL — ABNORMAL LOW (ref 8.9–10.3)
Chloride: 95 mmol/L — ABNORMAL LOW (ref 98–111)
Creatinine, Ser: 1.37 mg/dL — ABNORMAL HIGH (ref 0.44–1.00)
GFR, Estimated: 37 mL/min — ABNORMAL LOW (ref >60)
Glucose, Bld: 101 mg/dL — ABNORMAL HIGH (ref 70–99)
Potassium: 4.2 mmol/L (ref 3.5–5.1)
Sodium: 124 mmol/L — ABNORMAL LOW (ref 135–145)

## 2020-10-09 LAB — SODIUM
Sodium: 124 mmol/L — ABNORMAL LOW (ref 135–145)
Sodium: 123 mmol/L — ABNORMAL LOW (ref 135–145)
Sodium: 124 mmol/L — ABNORMAL LOW (ref 135–145)
Sodium: 124 mmol/L — ABNORMAL LOW (ref 135–145)
Sodium: 125 mmol/L — ABNORMAL LOW (ref 135–145)

## 2020-10-09 LAB — CBC
HCT: 24.3 % — ABNORMAL LOW (ref 36.0–46.0)
Hemoglobin: 8.2 g/dL — ABNORMAL LOW (ref 12.0–15.0)
MCH: 31.2 pg (ref 26.0–34.0)
MCHC: 33.7 g/dL (ref 30.0–36.0)
MCV: 92.4 fL (ref 80.0–100.0)
Platelets: 387 10*3/uL (ref 150–400)
RBC: 2.63 MIL/uL — ABNORMAL LOW (ref 3.87–5.11)
RDW: 13.8 % (ref 11.5–15.5)
WBC: 9.3 10*3/uL (ref 4.0–10.5)
nRBC: 0 % (ref 0.0–0.2)

## 2020-10-09 LAB — PHOSPHORUS: Phosphorus: 2.9 mg/dL (ref 2.5–4.6)

## 2020-10-09 LAB — MAGNESIUM: Magnesium: 1.6 mg/dL — ABNORMAL LOW (ref 1.7–2.4)

## 2020-10-09 MED ORDER — FUROSEMIDE 20 MG PO TABS
20.0000 mg | ORAL_TABLET | Freq: Every day | ORAL | Status: DC
  Administered 2020-10-09 – 2020-10-10 (×2): 20 mg via ORAL
  Filled 2020-10-09 (×2): qty 1

## 2020-10-09 MED ORDER — SENNOSIDES-DOCUSATE SODIUM 8.6-50 MG PO TABS
2.0000 | ORAL_TABLET | Freq: Two times a day (BID) | ORAL | Status: DC
  Administered 2020-10-09: 2 via ORAL
  Filled 2020-10-09: qty 2

## 2020-10-09 MED ORDER — POLYETHYLENE GLYCOL 3350 17 G PO PACK
17.0000 g | PACK | Freq: Every day | ORAL | Status: DC
  Administered 2020-10-09: 17 g via ORAL
  Filled 2020-10-09: qty 1

## 2020-10-09 MED ORDER — ALLOPURINOL 300 MG PO TABS
150.0000 mg | ORAL_TABLET | Freq: Every day | ORAL | Status: DC
  Administered 2020-10-09 – 2020-10-10 (×2): 150 mg via ORAL
  Filled 2020-10-09 (×2): qty 1

## 2020-10-09 MED ORDER — SENNOSIDES-DOCUSATE SODIUM 8.6-50 MG PO TABS
2.0000 | ORAL_TABLET | Freq: Every day | ORAL | Status: DC
  Administered 2020-10-09: 2 via ORAL
  Filled 2020-10-09 (×2): qty 2

## 2020-10-09 MED ORDER — FOSFOMYCIN TROMETHAMINE 3 G PO PACK
3.0000 g | PACK | Freq: Once | ORAL | Status: AC
  Administered 2020-10-09: 3 g via ORAL
  Filled 2020-10-09: qty 3

## 2020-10-09 MED ORDER — MAGNESIUM SULFATE 2 GM/50ML IV SOLN
2.0000 g | Freq: Once | INTRAVENOUS | Status: AC
  Administered 2020-10-09: 2 g via INTRAVENOUS
  Filled 2020-10-09: qty 50

## 2020-10-09 MED ORDER — POTASSIUM CHLORIDE CRYS ER 20 MEQ PO TBCR
20.0000 meq | EXTENDED_RELEASE_TABLET | Freq: Once | ORAL | Status: AC
  Administered 2020-10-09: 20 meq via ORAL
  Filled 2020-10-09: qty 1

## 2020-10-09 NOTE — Progress Notes (Signed)
PROGRESS NOTE  Brenda [name] KKX:[id number] DOB: 1931-01-18 DOA: 10/05/2020 PCP: Burnard Bunting, MD  HPI/Recap of past 24 hours: Patient is a 87 yo female with PMH chronic hyponatremia (baseline NA 127), HTN, HLD, OA, hydronephrosis, and recurrent UTI's of  presenting to [facility] on 10/05/2020 with 2-3 d h/o N&V, mild LE edema and lab work identified by PCP showing severe hyponatremia. Sodium 108 on presentation. Seen by nephro in ED who recommended hypertonic saline. PCCM asked to admit.   Pertinent  Medical History   has a past medical history of Erythematous bladder mucosa, Frequent urination, Gait instability, GERD (gastroesophageal reflux disease), History of basal cell carcinoma excision, History of gout, History of recurrent UTIs, Hydronephrosis, Hyperlipidemia, Hypertension, Nocturia, Numbness and tingling of both legs, OA (osteoarthritis), Urge urinary incontinence, and Wears glasses.  Transferred to [facility] on 10/08/2020.  She was started on salt tablets on 10/07/2020.  10/09/20: Patient was seen and examined at bedside with her daughter present.  She reports feeling uncomfortable due to constipation.  No bowel movement in the last 3 days.  Denies nausea, vomiting or abdominal pain.  Assessment/Plan: Active Problems:   Hyponatremia  Acute on chronic symptomatic hyporosmolar hyponatremia:   Initially started on 3%, which was stopped after Na corrected from 108 to 117 in roughly 15 hours. D5W started and NA back to 113. With D5w stopped Na remained 113. 3% restarted at 80mL/hr prompting a safe gradual rise in NA 113 >117 in 16 hrs.   - She is now off 3%.  - no DDAVP - Restrict water to 1L/day - Trend BMP/UO -She was started on salt tablets 1 g twice daily on 10/07/2020, continue -Seen by nephrology, appreciate assistance. -We will plan to discharge home with serum sodium of 130. -Currently serum sodium 121 uptrending to 124. Continue to closely monitor serum sodium and avoid rapid  correction.  Acinetobacter Calcoaceticus baumannii complex UTI Urine culture obtained on 10/07/2020 grew greater than 100,000 Acinetobacter Calcoaceticus baumannii complex, pan-sensitive No evidence of urosepsis Afebrile with no leukocytosis Ordered 1 dose of fosfomycin 3 g x 1 Discontinue Foley catheter on 10/09/2020.  AKI, improving. Hyperkalemia, resolved ? Hx Hunners disease, interstitial cystitis.  Avoid nephrotoxic agent, dehydration and hypotension. Monitor urine output  Acute urinary retention DC Foley catheter on 10/09/2020. Voiding trial.  Chronic hydronephrosis since 2017 - Urology has seen. Chronic hydronephrosis since 2017.   Will need voiding trial after foley removed. No surgical options indicated.    Chronic normocytic anemia Unclear etiology External hemorrhoids from colonoscopy done in 2004. No reported overt bleeding. - Trend CBC - Transfuse for hgb < 7  Essential hypertension BP stable and at goal Continue home amlodipine Continue to monitor vital signs  Gout Stable Resume home allopurinol.  Generalized weakness PT recommended home health PT Continue fall precautions  Chronic constipation Scheduled bowel regimen Soap suds enema    Code Status: Full code.  Family Communication: Updated her daughter at bedside.  Disposition Plan: Likely to home with home health PT in the next 24 to 48 hours   Consultants:  Nephrology  PCCM  Procedures:  None.  Antimicrobials:  None.  DVT prophylaxis: Subcu heparin 3 times daily.  Status is: Inpatient    Dispo:  Patient From: Home  Planned Disposition: Home with Health Care Svc possibly on 10/10/2020 once serum sodium is close to 130 or when nephrology signs off.  Medically stable for discharge: No          Objective: Vitals:  10/08/20 2014 10/09/20 0455 10/09/20 0500 10/09/20 1310  BP: (!) 129/58 (!) 141/58  (!) 124/51  Pulse: 89 81  76  Resp: 20 18  18   Temp: 98.1 F  (36.7 C) 98.1 F (36.7 C)  97.7 F (36.5 C)  TempSrc: Oral Oral  Oral  SpO2: 95% 97%  97%  Weight:   61.6 kg   Height:        Intake/Output Summary (Last 24 hours) at 10/09/2020 1540 Last data filed at 10/09/2020 1500 Gross per 24 hour  Intake 985 ml  Output 950 ml  Net 35 ml   Filed Weights   10/06/20 0425 10/08/20 0500 10/09/20 0500  Weight: 62.1 kg 60.2 kg 61.6 kg    Exam:  . General: 87 y.o. year-old female frail-appearing no acute distress.  She is alert and interactive.   . Cardiovascular: Regular rate and rhythm no rubs or gallops.   Marland Kitchen Respiratory: Clear to auscultation no wheezes or rales.   . Abdomen: Soft nontender normal bowel sounds present.   . Musculoskeletal: No lower extremity edema bilaterally.   . Skin: No ulcerative lesions noted. Marland Kitchen Psychiatry: Mood is appropriate for condition and setting.   Data Reviewed: CBC: Recent Labs  Lab 10/05/20 1335 10/06/20 0419 10/07/20 0431 10/08/20 0031 10/09/20 0424  WBC 10.1 8.4 9.7 9.7 9.3  NEUTROABS 6.8  --   --   --   --   HGB 9.3* 8.8* 9.0* 8.6* 8.2*  HCT 26.7* 25.3* 26.4* 25.5* 24.3*  MCV 88.1 89.4 89.8 91.4 92.4  PLT 500* 409* 439* 470* 027   Basic Metabolic Panel: Recent Labs  Lab 10/05/20 1335 10/05/20 1714 10/07/20 0431 10/07/20 0906 10/07/20 1805 10/07/20 2027 10/08/20 0031 10/08/20 1822 10/09/20 0102 10/09/20 0424 10/09/20 0905 10/09/20 1223  NA 108*   < > 117*   < > 121* 122* 121* 123* 124* 124* 123* 124*  K 5.5*   < > 4.1   < > 4.3 4.0 4.0 3.7  --  4.2  --   --   CL 78*   < > 88*   < > 89* 92* 91* 92*  --  95*  --   --   CO2 21*   < > 20*   < > 23 21* 23 19*  --  21*  --   --   GLUCOSE 114*   < > 96   < > 129* 133* 113* 171*  --  101*  --   --   BUN 31*   < > 32*   < > 28* 31* 31* 29*  --  31*  --   --   CREATININE 1.50*   < > 1.28*   < > 1.30* 1.33* 1.20* 1.40*  --  1.37*  --   --   CALCIUM 8.7*   < > 8.2*   < > 8.4* 8.2* 8.1* 8.0*  --  8.2*  --   --   MG 1.7  --  1.5*  --   --   --    --   --   --  1.6*  --   --   PHOS 3.7  --  3.7  --   --   --   --   --   --  2.9  --   --    < > = values in this interval not displayed.   GFR: Estimated Creatinine Clearance: 23.9 mL/min (A) (by C-G formula based on SCr of 1.37  mg/dL (H)). Liver Function Tests: No results for input(s): AST, ALT, ALKPHOS, BILITOT, PROT, ALBUMIN in the last 168 hours. No results for input(s): LIPASE, AMYLASE in the last 168 hours. No results for input(s): AMMONIA in the last 168 hours. Coagulation Profile: No results for input(s): INR, PROTIME in the last 168 hours. Cardiac Enzymes: No results for input(s): CKTOTAL, CKMB, CKMBINDEX, TROPONINI in the last 168 hours. BNP (last 3 results) No results for input(s): PROBNP in the last 8760 hours. HbA1C: No results for input(s): HGBA1C in the last 72 hours. CBG: No results for input(s): GLUCAP in the last 168 hours. Lipid Profile: No results for input(s): CHOL, HDL, LDLCALC, TRIG, CHOLHDL, LDLDIRECT in the last 72 hours. Thyroid Function Tests: No results for input(s): TSH, T4TOTAL, FREET4, T3FREE, THYROIDAB in the last 72 hours. Anemia Panel: No results for input(s): VITAMINB12, FOLATE, FERRITIN, TIBC, IRON, RETICCTPCT in the last 72 hours. Urine analysis:    Component Value Date/Time   COLORURINE YELLOW 10/07/2020 1034   APPEARANCEUR TURBID (A) 10/07/2020 1034   LABSPEC 1.008 10/07/2020 1034   PHURINE 6.0 10/07/2020 1034   GLUCOSEU 50 (A) 10/07/2020 1034   HGBUR MODERATE (A) 10/07/2020 1034   BILIRUBINUR NEGATIVE 10/07/2020 [place] 10/07/2020 1034   PROTEINUR 100 (A) 10/07/2020 1034   UROBILINOGEN 0.2 12/12/2014 1940   NITRITE NEGATIVE 10/07/2020 1034   LEUKOCYTESUR LARGE (A) 10/07/2020 1034   Sepsis Labs: @LABRCNTIP (procalcitonin:4,lacticidven:4)  ) Recent Results (from the past 240 hour(s))  Resp Panel by RT-PCR (Flu A&B, Covid) Nasopharyngeal Swab     Status: None   Collection Time: 10/05/20  2:34 PM   Specimen:  Nasopharyngeal Swab; Nasopharyngeal(NP) swabs in vial transport medium  Result Value Ref Range Status   SARS Coronavirus 2 by RT PCR NEGATIVE NEGATIVE Final    Comment: (NOTE) SARS-CoV-2 target nucleic acids are NOT DETECTED.  The SARS-CoV-2 RNA is generally detectable in upper respiratory specimens during the acute phase of infection. The lowest concentration of SARS-CoV-2 viral copies this assay can detect is 138 copies/mL. A negative result does not preclude SARS-Cov-2 infection and should not be used as the sole basis for treatment or other patient management decisions. A negative result may occur with  improper specimen collection/handling, submission of specimen other than nasopharyngeal swab, presence of viral mutation(s) within the areas targeted by this assay, and inadequate number of viral copies(<138 copies/mL). A negative result must be combined with clinical observations, patient history, and epidemiological information. The expected result is Negative.  Fact Sheet for Patients:  EntrepreneurPulse.com.au  Fact Sheet for Healthcare Providers:  IncredibleEmployment.be  This test is no t yet approved or cleared by the [place] FDA and  has been authorized for detection and/or diagnosis of SARS-CoV-2 by FDA under an Emergency Use Authorization (EUA). This EUA will remain  in effect (meaning this test can be used) for the duration of the COVID-19 declaration under Section 564(b)(1) of the Act, 21 U.S.C.section 360bbb-3(b)(1), unless the authorization is terminated  or revoked sooner.       Influenza A by PCR NEGATIVE NEGATIVE Final   Influenza B by PCR NEGATIVE NEGATIVE Final    Comment: (NOTE) The Xpert Xpress SARS-CoV-2/FLU/RSV plus assay is intended as an aid in the diagnosis of influenza from Nasopharyngeal swab specimens and should not be used as a sole basis for treatment. Nasal washings and aspirates are unacceptable for  Xpert Xpress SARS-CoV-2/FLU/RSV testing.  Fact Sheet for Patients: EntrepreneurPulse.com.au  Fact Sheet for Healthcare Providers: IncredibleEmployment.be  This test is not yet approved or cleared by the [place] and has been authorized for detection and/or diagnosis of SARS-CoV-2 by FDA under an Emergency Use Authorization (EUA). This EUA will remain in effect (meaning this test can be used) for the duration of the COVID-19 declaration under Section 564(b)(1) of the Act, 21 U.S.C. section 360bbb-3(b)(1), unless the authorization is terminated or revoked.  Performed at [facility], [place] [address], [place], [place] [address]   MRSA PCR Screening     Status: None   Collection Time: 10/05/20  7:38 PM   Specimen: Nasal Mucosa; Nasopharyngeal  Result Value Ref Range Status   MRSA by PCR NEGATIVE NEGATIVE Final    Comment:        The GeneXpert MRSA Assay (FDA approved for NASAL specimens only), is one component of a comprehensive MRSA colonization surveillance program. It is not intended to diagnose MRSA infection nor to guide or monitor treatment for MRSA infections. Performed at [facility], [place] [address], [place], [place] [address]   Culture, Urine     Status: Abnormal   Collection Time: 10/07/20 12:54 PM   Specimen: Urine, Catheterized  Result Value Ref Range Status   Specimen Description   Final    URINE, CATHETERIZED Performed at [facility] [address] [place], [place] [address]    Special Requests   Final    NONE Performed at [facility], [place] [address], [place], [place] [address]    Culture (A)  Final    >=100,000 COLONIES/mL ACINETOBACTER CALCOACETICUS/BAUMANNII COMPLEX   Report Status 10/09/2020 FINAL  Final   Organism ID, Bacteria ACINETOBACTER CALCOACETICUS/BAUMANNII COMPLEX (A)  Final      Susceptibility   Acinetobacter  calcoaceticus/baumannii complex - MIC*    CEFTAZIDIME 4 SENSITIVE Sensitive     CIPROFLOXACIN <=0.25 SENSITIVE Sensitive     GENTAMICIN <=1 SENSITIVE Sensitive     IMIPENEM <=0.25 SENSITIVE Sensitive     PIP/TAZO <=4 SENSITIVE Sensitive     TRIMETH/SULFA <=20 SENSITIVE Sensitive     AMPICILLIN/SULBACTAM <=2 SENSITIVE Sensitive     * >=100,000 COLONIES/mL ACINETOBACTER CALCOACETICUS/BAUMANNII COMPLEX      Studies: No results found.  Scheduled Meds: . amLODipine  5 mg Oral Daily  . chlorhexidine  15 mL Mouth Rinse BID  . Chlorhexidine Gluconate Cloth  6 each Topical Daily  . furosemide  20 mg Oral Daily  . heparin  5,000 Units Subcutaneous Q8H  . mouth rinse  15 mL Mouth Rinse q12n4p  . senna-docusate  2 tablet Oral Daily  . sodium chloride  1 g Oral BID WC  . vitamin B-12  1,000 mcg Oral Daily    Continuous Infusions:   LOS: 4 days     Kayleen Memos, MD [facility] Pager [phone]  If 7PM-7AM, please contact night-coverage [email or web address] Password [facility] 10/09/2020, 3:40 PM

## 2020-10-09 NOTE — Progress Notes (Signed)
Marland Kitchen  [facility] Progress Note   Assessment/Plan: 1. Hyponatremia - unclear etiology but certainly nausea is a very strong stimulant of vasopressin release and is playing a role though her 5/24 urine osm 262, serum osm 242 show not a strong component of ADH. Her lower extremity edema is a result of Amlodipine and unlikely to be from CHF.  Marland Kitchen By history doesn't appear to be from poor solute intake which can cause a reset osmostat; she had  poor oral intake after the nausea appeared Sunday.  TSH normal, cortisol 8.8 collected at 830pm.  BP has been high/normal.  Initially had been on  And off 3% NS but been off for a few days.  - Started on  NaCl tablets; will add Lasix 20mg  PO daily. She will need these meds upon  D/c from  the  hospital  - Cont water restrict to 1L / day. - Strict I&O's.  - Will need f/u with [facility] in 2 weeks.  2. Hyperkalemia may be related to AKI +/- CKD:  Resolved with med mgmt.  Cont  To follow 3. AKI or CKD? - Renal US with bilateral moderate hydronephrosis.  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 Tresa Moore but last eval preCOVID.  Consulted urology who rec ct stone protocol.  Currently with  Foley; will need bladder voiding trial when foley is removed. 4. HTN - resume amlodipine. She was recently taken off Lisinopril likely because of AKI + hyperkalemia. 5. Gout - hold allopurinol for now.  Subjective:   eval with daughter bedside - improved today.  No further nausea or emesis; no dizziness or confusion.  No dyspnea.  Out of bed to chair and walked with OT yest. OOB to chair this AM.   Objective Vitals:   10/08/20 1400 10/08/20 2014 10/09/20 0455 10/09/20 0500  BP:  (!) 129/58 (!) 141/58   Pulse:  89 81   Resp: 15 20 18    Temp:  98.1 F (36.7 C) 98.1 F (36.7 C)   TempSrc:  Oral Oral   SpO2:  95% 97%   Weight:    61.6 kg  Height:      I/Os yesterday:  570 / 1050 Physical  Exam General: elderly woman alert,  in bed with HOF elevated Heart: RRR Lungs: normal WOB on RA Abdomen: soft, nondistended Extremities: no edema Neuro:  Conversant, nonfocal; hard of hearing   Additional Objective Labs: Basic Metabolic Panel: Recent Labs  Lab 10/05/20 1335 10/05/20 1714 10/07/20 0431 10/07/20 0906 10/08/20 0031 10/08/20 1822 10/09/20 0102 10/09/20 0424 10/09/20 0905  NA 108*   < > 117*   < > 121* 123* 124* 124* 123*  K 5.5*   < > 4.1   < > 4.0 3.7  --  4.2  --   CL 78*   < > 88*   < > 91* 92*  --  95*  --   CO2 21*   < > 20*   < > 23 19*  --  21*  --   GLUCOSE 114*   < > 96   < > 113* 171*  --  101*  --   BUN 31*   < > 32*   < > 31* 29*  --  31*  --   CREATININE 1.50*   < > 1.28*   < > 1.20* 1.40*  --  1.37*  --   CALCIUM 8.7*   < >  8.2*   < > 8.1* 8.0*  --  8.2*  --   PHOS 3.7  --  3.7  --   --   --   --  2.9  --    < > = values in this interval not displayed.   Liver Function Tests: No results for input(s): AST, ALT, ALKPHOS, BILITOT, PROT, ALBUMIN in the last 168 hours. No results for input(s): LIPASE, AMYLASE in the last 168 hours. CBC: Recent Labs  Lab 10/05/20 1335 10/06/20 0419 10/07/20 0431 10/08/20 0031 10/09/20 0424  WBC 10.1 8.4 9.7 9.7 9.3  NEUTROABS 6.8  --   --   --   --   HGB 9.3* 8.8* 9.0* 8.6* 8.2*  HCT 26.7* 25.3* 26.4* 25.5* 24.3*  MCV 88.1 89.4 89.8 91.4 92.4  PLT 500* 409* 439* 470* 387   Blood Culture    Component Value Date/Time   SDES  10/07/2020 1254    URINE, CATHETERIZED Performed at [facility] [address], [place], [place] [address]    SPECREQUEST  10/07/2020 1254    NONE Performed at [facility], [place] [address] [place], [place] [address]    CULT (A) 10/07/2020 1254    >=100,000 COLONIES/mL ACINETOBACTER CALCOACETICUS/BAUMANNII COMPLEX   REPTSTATUS 10/09/2020 FINAL 10/07/2020 1254    Cardiac Enzymes: No results for input(s): CKTOTAL, CKMB, CKMBINDEX,  TROPONINI in the last 168 hours. CBG: No results for input(s): GLUCAP in the last 168 hours. Iron Studies: No results for input(s): IRON, TIBC, TRANSFERRIN, FERRITIN in the last 72 hours. @lablastinr3 @ Studies/Results: No results found. Medications:  . amLODipine  5 mg Oral Daily  . chlorhexidine  15 mL Mouth Rinse BID  . Chlorhexidine Gluconate Cloth  6 each Topical Daily  . heparin  5,000 Units Subcutaneous Q8H  . mouth rinse  15 mL Mouth Rinse q12n4p  . senna-docusate  2 tablet Oral Daily  . sodium chloride  1 g Oral BID WC  . vitamin B-12  1,000 mcg Oral Daily     Otelia Santee MD 10/09/2020, 10:49 AM  [facility] [phone]

## 2020-10-10 DIAGNOSIS — N390 Urinary tract infection, site not specified: Secondary | ICD-10-CM | POA: Diagnosis not present

## 2020-10-10 DIAGNOSIS — D638 Anemia in other chronic diseases classified elsewhere: Secondary | ICD-10-CM

## 2020-10-10 DIAGNOSIS — N179 Acute kidney failure, unspecified: Secondary | ICD-10-CM | POA: Diagnosis not present

## 2020-10-10 DIAGNOSIS — E871 Hypo-osmolality and hyponatremia: Secondary | ICD-10-CM | POA: Diagnosis not present

## 2020-10-10 DIAGNOSIS — I10 Essential (primary) hypertension: Secondary | ICD-10-CM

## 2020-10-10 LAB — BASIC METABOLIC PANEL
Anion gap: 6 (ref 5–15)
BUN: 30 mg/dL — ABNORMAL HIGH (ref 8–23)
CO2: 22 mmol/L (ref 22–32)
Calcium: 8 mg/dL — ABNORMAL LOW (ref 8.9–10.3)
Chloride: 96 mmol/L — ABNORMAL LOW (ref 98–111)
Creatinine, Ser: 1.39 mg/dL — ABNORMAL HIGH (ref 0.44–1.00)
GFR, Estimated: 37 mL/min — ABNORMAL LOW (ref >60)
Glucose, Bld: 99 mg/dL (ref 70–99)
Potassium: 4.2 mmol/L (ref 3.5–5.1)
Sodium: 124 mmol/L — ABNORMAL LOW (ref 135–145)

## 2020-10-10 LAB — CBC
HCT: 23.5 % — ABNORMAL LOW (ref 36.0–46.0)
Hemoglobin: 7.8 g/dL — ABNORMAL LOW (ref 12.0–15.0)
MCH: 30.5 pg (ref 26.0–34.0)
MCHC: 33.2 g/dL (ref 30.0–36.0)
MCV: 91.8 fL (ref 80.0–100.0)
Platelets: 435 K/uL — ABNORMAL HIGH (ref 150–400)
RBC: 2.56 MIL/uL — ABNORMAL LOW (ref 3.87–5.11)
RDW: 13.9 % (ref 11.5–15.5)
WBC: 9.5 K/uL (ref 4.0–10.5)
nRBC: 0 % (ref 0.0–0.2)

## 2020-10-10 LAB — SODIUM
Sodium: 123 mmol/L — ABNORMAL LOW (ref 135–145)
Sodium: 124 mmol/L — ABNORMAL LOW (ref 135–145)
Sodium: 123 mmol/L — ABNORMAL LOW (ref 135–145)

## 2020-10-10 LAB — MAGNESIUM: Magnesium: 1.8 mg/dL (ref 1.7–2.4)

## 2020-10-10 MED ORDER — SODIUM CHLORIDE 1 G PO TABS: 1.0000 g | ORAL_TABLET | Freq: Two times a day (BID) | ORAL | 0 refills | Status: AC

## 2020-10-10 MED ORDER — FUROSEMIDE 20 MG PO TABS: 20.0000 mg | ORAL_TABLET | Freq: Every day | ORAL | 0 refills | Status: DC

## 2020-10-10 NOTE — Plan of Care (Signed)
  Problem: Education: Goal: Knowledge of General Education information will improve Description: Including pain rating scale, medication(s)/side effects and non-pharmacologic comfort measures Outcome: Adequate for Discharge   Problem: Health Behavior/Discharge Planning: Goal: Ability to manage health-related needs will improve Outcome: Adequate for Discharge   Problem: Clinical Measurements: Goal: Ability to maintain clinical measurements within normal limits will improve Outcome: Adequate for Discharge Goal: Will remain free from infection Outcome: Adequate for Discharge Goal: Diagnostic test results will improve Outcome: Adequate for Discharge Goal: Cardiovascular complication will be avoided Outcome: Adequate for Discharge   Problem: Activity: Goal: Risk for activity intolerance will decrease 10/10/2020 1543 by Saunders Glance, RN Outcome: Adequate for Discharge 10/10/2020 1430 by Saunders Glance, RN Outcome: Progressing   Problem: Nutrition: Goal: Adequate nutrition will be maintained 10/10/2020 1543 by Saunders Glance, RN Outcome: Adequate for Discharge 10/10/2020 1430 by Saunders Glance, RN Outcome: Progressing   Problem: Coping: Goal: Level of anxiety will decrease Outcome: Adequate for Discharge   Problem: Elimination: Goal: Will not experience complications related to bowel motility Outcome: Adequate for Discharge Goal: Will not experience complications related to urinary retention Outcome: Adequate for Discharge   Problem: Pain Managment: Goal: General experience of comfort will improve Outcome: Adequate for Discharge   Problem: Safety: Goal: Ability to remain free from injury will improve Outcome: Adequate for Discharge   Problem: Skin Integrity: Goal: Risk for impaired skin integrity will decrease Outcome: Adequate for Discharge

## 2020-10-10 NOTE — Progress Notes (Signed)
Physical Therapy Treatment Patient Details Name: Brenda [name] MRN: [id number] DOB: 28-Oct-1930 Today's Date: 10/10/2020    History of Present Illness Pt c/o vomiting and loose stool and sent to ED from PCP 2* low sodium.  Pt with hx of hydonephritis, gout cervical fusion and thoracic laminectomy.    PT Comments    Pt very cooperative and with slowly improving stability and activity tolerance.  Pt is hopeful for dc home today or tomorrow with family.   Follow Up Recommendations  Home health PT     Equipment Recommendations  None recommended by PT    Recommendations for Other Services       Precautions / Restrictions Precautions Precautions: Fall Restrictions Weight Bearing Restrictions: No    Mobility  Bed Mobility               General bed mobility comments: Up in chair and requests back to same    Transfers Overall transfer level: Needs assistance Equipment used: Rolling walker (2 wheeled) Transfers: Sit to/from Stand Sit to Stand: Min guard         General transfer comment: min guard for safety  Ambulation/Gait Ambulation/Gait assistance: Min guard Gait Distance (Feet): 150 Feet Assistive device: Rolling walker (2 wheeled) Gait Pattern/deviations: Step-through pattern;Decreased step length - right;Decreased step length - left;Shuffle;Trunk flexed     General Gait Details: cues for posture and position from RW; assist for balance/support and RW management   Stairs             Wheelchair Mobility    Modified Rankin (Stroke Patients Only)       Balance Overall balance assessment: Needs assistance Sitting-balance support: No upper extremity supported Sitting balance-Leahy Scale: Good     Standing balance support: Bilateral upper extremity supported Standing balance-Leahy Scale: Poor Standing balance comment: reliant on walker                            Cognition Arousal/Alertness: Awake/alert Behavior During Therapy:  WFL for tasks assessed/performed Overall Cognitive Status: Within Functional Limits for tasks assessed                                        Exercises      General Comments        Pertinent Vitals/Pain Pain Assessment: No/denies pain    Home Living                      Prior Function            PT Goals (current goals can now be found in the care plan section) Acute Rehab PT Goals Patient Stated Goal: Regain IND PT Goal Formulation: With patient Time For Goal Achievement: 10/22/20 Potential to Achieve Goals: Good Progress towards PT goals: Progressing toward goals    Frequency    Min 3X/week      PT Plan Current plan remains appropriate    Co-evaluation              AM-PAC PT "6 Clicks" Mobility   Outcome Measure  Help needed turning from your back to your side while in a flat bed without using bedrails?: A Little Help needed moving from lying on your back to sitting on the side of a flat bed without using bedrails?: A Little Help needed moving to and from a  bed to a chair (including a wheelchair)?: A Little Help needed standing up from a chair using your arms (e.g., wheelchair or bedside chair)?: A Little Help needed to walk in hospital room?: A Little Help needed climbing 3-5 steps with a railing? : A Lot 6 Click Score: 17    End of Session Equipment Utilized During Treatment: Gait belt Activity Tolerance: Patient tolerated treatment well;Patient limited by fatigue Patient left: in chair;with call bell/phone within reach;with family/visitor present Nurse Communication: Mobility status PT Visit Diagnosis: Difficulty in walking, not elsewhere classified (R26.2);Muscle weakness (generalized) (M62.81)     Time: 6203-5597 PT Time Calculation (min) (ACUTE ONLY): 14 min  Charges:  $Gait Training: 8-22 mins                     [place] Pager [phone] Office  [phone]    Brenda [name] 10/10/2020, 1:13 PM

## 2020-10-10 NOTE — Progress Notes (Signed)
Marland Kitchen  [facility] Progress Note   Assessment/Plan: 1. Hyponatremia - unclear etiology but certainly nausea is a very strong stimulant of vasopressin release and is playing a role though her 5/24 urine osm 262, serum osm 242 show not a strong component of ADH. Her lower extremity edema is a result of Amlodipine and unlikely to be from CHF.  Marland Kitchen By history doesn't appear to be from poor solute intake which can cause a reset osmostat; she had  poor oral intake after the nausea appeared Sunday.  TSH normal, cortisol 8.8 collected at 830pm.  BP has been high/normal.  Initially had been on  And off 3% NS but been off for a few days. - Cont water restrict to 1L / day. - Strict I&O's.  - Started on  NaCl tablets; converted to Lasix 20mg  PO daily. She will need these meds upon  D/c from  the  hospital - From renal standpoint she is stable for discharge w/   f/u with [facility] in 2 weeks. I've already instructed her to call [facility] for a [facility] order for BMet and cbc a few days before her appt so we will have labs to review with her.  Acinetobacter in urine culture; with the significant WBC's and colony count likely will need to treat.    2. Hyperkalemia may be related to AKI +/- CKD:  Resolved with med mgmt.  Cont  To follow 3. AKI or CKD? - Renal US with bilateral moderate hydronephrosis.  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 Tresa Moore but last eval preCOVID.  Consulted urology who rec ct stone protocol.  Currently without  Foley and she is doing well. 4. HTN - resume amlodipine. She was recently taken off Lisinopril likely because of AKI + hyperkalemia. 5. Gout - hold allopurinol for now.  Subjective:   eval with daughter bedside - improved today.  No further nausea or emesis; no dizziness or confusion.  No dyspnea.  Out of bed to chair and walked with OT yest. OOB to chair this AM.   Objective Vitals:   10/09/20 1310  10/09/20 2050 10/10/20 0455 10/10/20 0600  BP: (!) 124/51 133/90 (!) 144/52   Pulse: 76 91 76   Resp: 18 20 16    Temp: 97.7 F (36.5 C) 98.4 F (36.9 C) 98.4 F (36.9 C)   TempSrc: Oral Oral Oral   SpO2: 97% (!) 89% 97%   Weight:    60.7 kg  Height:      I/Os yesterday:  570 / 1050 Physical Exam General: elderly woman alert,  in chair Heart: RRR Lungs: normal WOB on RA Abdomen: soft, nondistended Extremities: no edema Neuro:  Conversant, nonfocal; hard of hearing   Additional Objective Labs: Basic Metabolic Panel: Recent Labs  Lab 10/05/20 1335 10/05/20 1714 10/07/20 0431 10/07/20 0906 10/08/20 1822 10/09/20 0102 10/09/20 0424 10/09/20 0905 10/09/20 2121 10/10/20 0046 10/10/20 0459  NA 108*   < > 117*   < > 123*   < > 124*   < > 125* 123* 124*  K 5.5*   < > 4.1   < > 3.7  --  4.2  --   --   --  4.2  CL 78*   < > 88*   < > 92*  --  95*  --   --   --  96*  CO2 21*   < > 20*   < > 19*  --  21*  --   --   --  22  GLUCOSE 114*   < > 96   < > 171*  --  101*  --   --   --  99  BUN 31*   < > 32*   < > 29*  --  31*  --   --   --  30*  CREATININE 1.50*   < > 1.28*   < > 1.40*  --  1.37*  --   --   --  1.39*  CALCIUM 8.7*   < > 8.2*   < > 8.0*  --  8.2*  --   --   --  8.0*  PHOS 3.7  --  3.7  --   --   --  2.9  --   --   --   --    < > = values in this interval not displayed.   Liver Function Tests: No results for input(s): AST, ALT, ALKPHOS, BILITOT, PROT, ALBUMIN in the last 168 hours. No results for input(s): LIPASE, AMYLASE in the last 168 hours. CBC: Recent Labs  Lab 10/05/20 1335 10/06/20 0419 10/07/20 0431 10/08/20 0031 10/09/20 0424 10/10/20 0459  WBC 10.1 8.4 9.7 9.7 9.3 9.5  NEUTROABS 6.8  --   --   --   --   --   HGB 9.3* 8.8* 9.0* 8.6* 8.2* 7.8*  HCT 26.7* 25.3* 26.4* 25.5* 24.3* 23.5*  MCV 88.1 89.4 89.8 91.4 92.4 91.8  PLT 500* 409* 439* 470* 387 435*   Blood Culture    Component Value Date/Time   SDES  10/07/2020 1254    URINE,  CATHETERIZED Performed at [facility] [address] [place], [place] [address]    SPECREQUEST  10/07/2020 1254    NONE Performed at [facility] [place] [address] [place],  [address]    CULT (A) 10/07/2020 1254    >=100,000 COLONIES/mL ACINETOBACTER CALCOACETICUS/BAUMANNII COMPLEX   REPTSTATUS 10/09/2020 FINAL 10/07/2020 1254    Cardiac Enzymes: No results for input(s): CKTOTAL, CKMB, CKMBINDEX, TROPONINI in the last 168 hours. CBG: No results for input(s): GLUCAP in the last 168 hours. Iron Studies: No results for input(s): IRON, TIBC, TRANSFERRIN, FERRITIN in the last 72 hours. @lablastinr3 @ Studies/Results: No results found. Medications:  . allopurinol  150 mg Oral Daily  . amLODipine  5 mg Oral Daily  . chlorhexidine  15 mL Mouth Rinse BID  . furosemide  20 mg Oral Daily  . heparin  5,000 Units Subcutaneous Q8H  . mouth rinse  15 mL Mouth Rinse q12n4p  . polyethylene glycol  17 g Oral Daily  . senna-docusate  2 tablet Oral BID  . sodium chloride  1 g Oral BID WC  . vitamin B-12  1,000 mcg Oral Daily     Otelia Santee MD 10/10/2020, 9:38 AM  [facility] [phone]

## 2020-10-10 NOTE — Progress Notes (Signed)
Patient and her daughter given discharge, follow up, and medication instructions, verbalized understanding, IV x 2 and telemetry monitor removed, personal belongings with patient, daughter to transport home

## 2020-10-10 NOTE — Plan of Care (Signed)
  Problem: Clinical Measurements: Goal: Respiratory complications will improve Outcome: Completed/Met

## 2020-10-10 NOTE — TOC Transition Note (Signed)
Transition of Care [facility]) - CM/SW Discharge Note   Patient Details  Name: Brenda [name] MRN: [id number] Date of Birth: 02/27/1931  Transition of [facility] CM/SW Contact:  Giulietta Prokop, Marjie Skiff, RN Phone Number: 10/10/2020, 3:49 PM   Clinical Narrative:    HHPT was ordered by MD. The following [place] companies have been contacted to give referral. Several voice mails have been left and TOC will await return calls.  [facility]-Not in coverage area [facility]-voicemail [facility]-texted [facility]-voicemail [facility]-Not in coverage area [facility]-voicemail   Daughter [name] alerted that no home health has been secured yet. She was told we would contact her if a company calls back and is able to accept.    Barriers to Discharge: Continued Medical Work up   Patient Goals and CMS Choice Patient states their goals for this hospitalization and ongoing recovery are:: to go home CMS Medicare.gov Compare Post Acute Care list provided to:: Patient    Discharge Plan and Services   Discharge Planning Services: CM Consult               Readmission Risk Interventions No flowsheet data found.

## 2020-10-10 NOTE — Plan of Care (Signed)
  Problem: Activity: Goal: Risk for activity intolerance will decrease Outcome: Progressing   Problem: Nutrition: Goal: Adequate nutrition will be maintained Outcome: Progressing   

## 2020-10-10 NOTE — Discharge Summary (Signed)
Physician Discharge Summary  Brenda [name] ZOX:[id number] DOB: 1931/05/15 DOA: 10/05/2020  PCP: Burnard Bunting, MD  Admit date: 10/05/2020 Discharge date: 10/10/2020  Admitted From: Home  Disposition:  Home   Recommendations for Outpatient Follow-up and new medication changes:  1. Follow up with Dr. Reynaldo Minium in 7 days for basic metabolic panel.  2. Patient placed on salt tablets and furosemide for hyponatremia.   Home Health:  Yes  Equipment/Devices: na    Discharge Condition: stable  CODE STATUS: full  Diet recommendation: regular   Brief/Interim Summary: Brenda [name], Brenda [name] was admitted to the hospital with the working diagnosis of hyponatremia.   87 year old female past medical history for chronic hyponatremia, hypertension, dyslipidemia, hydronephrosis and recurrent urinary tract infections who presented with 2-3 days of nausea and vomiting.  As an outpatient she was diagnosed with worsening hyponatremia and she was referred to the hospital for further evaluation.  Patient had nausea and vomiting for about 2 days, associated with loose stools and decreased oral intake.  On her initial physical examination blood pressure 173/68, heart rate 87, temperature 97.7, respiratory rate 23, oxygen saturation 98%, her lungs were clear to auscultation bilaterally, heart S1-S2, present rhythmic, soft abdomen, no lower extremity edema.  Sodium 108, potassium 5.5, chloride 78, bicarb 21, glucose 114, BUN 31 creatinine 1.50, phosphorus 3.7, magnesium 1.7, serum osmolality 239, white count 10.1, hemoglobin 9.3, hematocrit 26.7, platelets 500. SARS COVID-19 negative  Urinalysis specific gravity 1.008, 21-50 red cells, > 50 white cells. Urine culture positive for Acinetobacter.  Renal ultrasonography with moderate bilateral hydronephrosis. Renal CT with moderate bilateral hydroureteronephrosis with mild perinephric stranding.  No obstructing calculus.  EKG 66 bpm, normal axis, normal intervals, sinus  rhythm, no ST segment or T wave changes.  Patient was placed on hypertonic saline and admitted to the intensive care unit.  Because of rapid hypernatremia correction patient received D5 water.  Transferred to [facility] 5/27.  Patient was placed on oral salt tablets along with oral furosemide.  Stable Na above 120, plan to follow up as outpatient.   1.  Acute hypovolemic hyponatremia, hyperkalemia, acute kidney injury on chronic kidney disease 3b/acute urinary retention.  Patient received hypertonic saline, avoid overcorrection of serum sodium, D5 water was given with improvement of serum sodium. She was placed on fluid restriction along with salt tablets with good toleration.  Her symptoms improved, no further nausea, vomiting or diarrhea.  Her discharge sodium was 124-123. Serum creatinine 1.39, BUN 30, bicarb 22. Foley catheter was removed with good toleration.  Patient will continue sodium tablets and furosemide at home.  Follow-up as an outpatient.  2.  Hypertension.  Continue blood pressure control with amlodipine.  3.  Gout.  Continue low dose of allopurinol, no signs of acute flare.  4.  Anemia of chronic disease.  Cell count remained stable, follow-up as an outpatient.  5.  Acinetobacter urinary tract infection.  Patient received 1 dose of fosfomycin during her hospitalization.  Discharge Diagnoses:  Principal Problem:   Hyponatremia Active Problems:   Essential hypertension   AKI (acute kidney injury) ([place])   Acute lower UTI   Anemia of chronic disease    Discharge Instructions   Allergies as of 10/10/2020   No Known Allergies     Medication List    TAKE these medications   acetaminophen 500 MG tablet Commonly known as: TYLENOL Take 1,000 mg by mouth every 6 (six) hours as needed for moderate pain.   allopurinol 300 MG tablet Commonly known as: ZYLOPRIM  Take 150 mg by mouth daily.   amLODipine 5 MG tablet Commonly known as: NORVASC Take 5 mg by mouth  daily.   diclofenac Sodium 1 % Gel Commonly known as: VOLTAREN APPLY 2 GRAMS TO AFFECTED AREA 4 TIMES A DAY What changed: See the new instructions.   furosemide 20 MG tablet Commonly known as: LASIX Take 1 tablet (20 mg total) by mouth daily for 15 days. Start taking on: Oct 11, 2020   multivitamin tablet Take 1 tablet by mouth daily.   OVER THE COUNTER MEDICATION Take 1 tablet by mouth daily. Vit D 3 125 mcg/ 100 mcg Vit K w Coconut oil   Phillips Colon Health Caps Take 1 capsule by mouth daily.   sodium chloride 1 g tablet Take 1 tablet (1 g total) by mouth 2 (two) times daily with a meal for 15 days.   Vitamin B12 1000 MCG Tbcr Take 1,000 mcg by mouth daily.   zinc gluconate 50 MG tablet Take 25 mg by mouth daily.       Follow-up Information    Burnard Bunting, MD Follow up in 1 week(s).   Specialty: Internal Medicine Why: Blood work needed.  Contact information: [address] [place] [address] [phone]              No Known Allergies  Consultations:  Nephrology    Procedures/Studies: US RENAL  Result Date: 10/05/2020 CLINICAL DATA:  Renal failure. EXAM: RENAL / URINARY TRACT ULTRASOUND COMPLETE COMPARISON:  February 12, 2018. FINDINGS: Right Kidney: Renal measurements: 11.3 x 6.9 x 5.2 cm = volume: 212 mL. Moderate right hydronephrosis is noted. Echogenicity within normal limits. No mass visualized. Left Kidney: Renal measurements: 9.8 x 4.9 x 5.0 cm = volume: 127 mL. Moderate left hydronephrosis is noted. Echogenicity within normal limits. No mass visualized. Bladder: Appears normal for degree of bladder distention. No ureteral jets are noted. Other: None. IMPRESSION: Moderate bilateral hydronephrosis is noted. CT urogram is recommended to rule out distal ureteral obstruction. Electronically Signed   By: Marijo Conception M.D.   On: 10/05/2020 17:42   ECHOCARDIOGRAM COMPLETE  Result Date: 10/06/2020    ECHOCARDIOGRAM REPORT   Patient Name:    Brenda [name] Date of Exam: 10/06/2020 Medical Rec #:  [id number]       Height:       63.0 in Accession #:    [id number]      Weight:       136.9 lb Date of Birth:  01-Mar-1931       BSA:          1.646 m Patient Age:    20 years        BP:           154/67 mmHg Patient Gender: F               HR:           83 bpm. Exam Location:  Inpatient Procedure: 2D Echo, 3D Echo, Cardiac Doppler and Color Doppler Indications:    R06.02 SOB  History:        Patient has no prior history of Echocardiogram examinations.                 Signs/Symptoms:Shortness of Breath and Dyspnea; Risk                 Factors:Hypertension and Dyslipidemia. Edema.  Sonographer:    Ricardo Referring Phys: [id number] Kid[place]  1. Left ventricular ejection fraction, by estimation, is 65 to 70%. The left ventricle has hyperdynamic function. The left ventricle has no regional wall motion abnormalities. There is mild concentric left ventricular hypertrophy. Left ventricular diastolic parameters are consistent with Grade I diastolic dysfunction (impaired relaxation).  2. Right ventricular systolic function is normal. The right ventricular size is normal.  3. The mitral valve is normal in structure. Trivial mitral valve regurgitation. No evidence of mitral stenosis.  4. The aortic valve is tricuspid. There is mild calcification of the aortic valve. Aortic valve regurgitation is not visualized. Mild to moderate aortic valve sclerosis/calcification is present, without any evidence of aortic stenosis.  5. The inferior vena cava is normal in size with greater than 50% respiratory variability, suggesting right atrial pressure of 3 mmHg. FINDINGS  Left Ventricle: Left ventricular ejection fraction, by estimation, is 65 to 70%. The left ventricle has hyperdynamic function. The left ventricle has no regional wall motion abnormalities. 3D left ventricular ejection fraction analysis performed but not  reported based on interpreter judgement due  to suboptimal quality. The left ventricular internal cavity size was normal in size. There is mild concentric left ventricular hypertrophy. Left ventricular diastolic parameters are consistent with Grade I diastolic dysfunction (impaired relaxation). Normal left ventricular filling pressure. Right Ventricle: The right ventricular size is normal. No increase in right ventricular wall thickness. Right ventricular systolic function is normal. Left Atrium: Left atrial size was normal in size. Right Atrium: Right atrial size was normal in size. Pericardium: There is no evidence of pericardial effusion. Mitral Valve: The mitral valve is normal in structure. Trivial mitral valve regurgitation. No evidence of mitral valve stenosis. Tricuspid Valve: The tricuspid valve is normal in structure. Tricuspid valve regurgitation is not demonstrated. No evidence of tricuspid stenosis. Aortic Valve: The aortic valve is tricuspid. There is mild calcification of the aortic valve. Aortic valve regurgitation is not visualized. Mild to moderate aortic valve sclerosis/calcification is present, without any evidence of aortic stenosis. Pulmonic Valve: The pulmonic valve was normal in structure. Pulmonic valve regurgitation is not visualized. No evidence of pulmonic stenosis. Aorta: The aortic root is normal in size and structure. Venous: The inferior vena cava is normal in size with greater than 50% respiratory variability, suggesting right atrial pressure of 3 mmHg. IAS/Shunts: No atrial level shunt detected by color flow Doppler.  LEFT VENTRICLE PLAX 2D LVIDd:         3.15 cm     Diastology LVIDs:         2.20 cm     LV e' medial:    4.68 cm/s LV PW:         1.40 cm     LV E/e' medial:  10.3 LV IVS:        1.25 cm     LV e' lateral:   6.53 cm/s LVOT diam:     1.83 cm     LV E/e' lateral: 7.4 LV SV:         62 LV SV Index:   38 LVOT Area:     2.63 cm  LV Volumes (MOD) LV vol d, MOD A2C: 50.0 ml LV vol d, MOD A4C: 57.2 ml LV vol s, MOD A2C:  11.3 ml LV vol s, MOD A4C: 17.4 ml LV SV MOD A2C:     38.7 ml LV SV MOD A4C:     57.2 ml LV SV MOD BP:      39.6 ml RIGHT VENTRICLE RV S prime:  14.50 cm/s TAPSE (M-mode): 2.2 cm LEFT ATRIUM             Index       RIGHT ATRIUM           Index LA diam:        3.20 cm 1.94 cm/m  RA Area:     11.20 cm LA Vol (A2C):   21.8 ml 13.24 ml/m RA Volume:   24.80 ml  15.07 ml/m LA Vol (A4C):   21.8 ml 13.24 ml/m LA Biplane Vol: 23.9 ml 14.52 ml/m  AORTIC VALVE LVOT Vmax:   109.00 cm/s LVOT Vmean:  80.000 cm/s LVOT VTI:    0.236 m  AORTA Ao Root diam: 3.20 cm Ao Asc diam:  3.10 cm MITRAL VALVE MV Area (PHT): 2.42 cm     SHUNTS MV Decel Time: 313 msec     Systemic VTI:  0.24 m MV E velocity: 48.16 cm/s   Systemic Diam: 1.83 cm MV A velocity: 116.00 cm/s MV E/A ratio:  0.42 Mihai Croitoru MD Electronically signed by Sanda Klein MD Signature Date/Time: 10/06/2020/3:59:45 PM    Final    CT RENAL STONE STUDY  Result Date: 10/06/2020 CLINICAL DATA:  Bilateral hydronephrosis. EXAM: CT ABDOMEN AND PELVIS WITHOUT CONTRAST TECHNIQUE: Multidetector CT imaging of the abdomen and pelvis was performed following the standard protocol without IV contrast. COMPARISON:  Oct 04, 2015. FINDINGS: Lower chest: No acute abnormality. Hepatobiliary: No focal liver abnormality is seen. No gallstones, gallbladder wall thickening, or biliary dilatation. Pancreas: Unremarkable. No pancreatic ductal dilatation or surrounding inflammatory changes. Spleen: Normal in size without focal abnormality. Adrenals/Urinary Tract: Adrenal glands appear normal. There is continued moderate bilateral hydroureteronephrosis with mild perinephric stranding, but no evidence of obstructing calculus. Urinary bladder is decompressed secondary to Foley catheter. Stomach/Bowel: Stomach is within normal limits. Appendix appears normal. No evidence of bowel wall thickening, distention, or inflammatory changes. Vascular/Lymphatic: Aortic atherosclerosis. No enlarged  abdominal or pelvic lymph nodes. Reproductive: Uterus and bilateral adnexa are unremarkable. Other: No abdominal wall hernia or abnormality. No abdominopelvic ascites. Musculoskeletal: No acute or significant osseous findings. IMPRESSION: Continued moderate bilateral hydroureteronephrosis is noted with mild perinephric stranding, but no evidence of obstructing calculus. Urinary bladder is decompressed secondary to Foley catheter. Aortic Atherosclerosis (ICD10-I70.0). Electronically Signed   By: Marijo Conception M.D.   On: 10/06/2020 13:35      Subjective: Patient is feeling better, no nausea or vomiting, has been out of bed and ambulating with physical therapy.   Discharge Exam: Vitals:   10/10/20 0455 10/10/20 1405  BP: (!) 144/52 127/61  Pulse: 76 90  Resp: 16 20  Temp: 98.4 F (36.9 C) 98.2 F (36.8 C)  SpO2: 97% 99%   Vitals:   10/09/20 2050 10/10/20 0455 10/10/20 0600 10/10/20 1405  BP: 133/90 (!) 144/52  127/61  Pulse: 91 76  90  Resp: 20 16  20   Temp: 98.4 F (36.9 C) 98.4 F (36.9 C)  98.2 F (36.8 C)  TempSrc: Oral Oral  Oral  SpO2: (!) 89% 97%  99%  Weight:   60.7 kg   Height:        General: Not in pain or dyspnea, Neurology: Awake and alert, non focal  E ENT: mild pallor, no icterus, oral mucosa moist Cardiovascular: No JVD. S1-S2 present, rhythmic, no gallops, rubs, or murmurs. No lower extremity edema. Pulmonary: positive breath sounds bilaterally, adequate air movement, no wheezing, rhonchi or rales. Gastrointestinal. Abdomen soft and non tender Skin.  No rashes Musculoskeletal: no joint deformities   The results of significant diagnostics from this hospitalization (including imaging, microbiology, ancillary and laboratory) are listed below for reference.     Microbiology: Recent Results (from the past 240 hour(s))  Resp Panel by RT-PCR (Flu A&B, Covid) Nasopharyngeal Swab     Status: None   Collection Time: 10/05/20  2:34 PM   Specimen: Nasopharyngeal  Swab; Nasopharyngeal(NP) swabs in vial transport medium  Result Value Ref Range Status   SARS Coronavirus 2 by RT PCR NEGATIVE NEGATIVE Final    Comment: (NOTE) SARS-CoV-2 target nucleic acids are NOT DETECTED.  The SARS-CoV-2 RNA is generally detectable in upper respiratory specimens during the acute phase of infection. The lowest concentration of SARS-CoV-2 viral copies this assay can detect is 138 copies/mL. A negative result does not preclude SARS-Cov-2 infection and should not be used as the sole basis for treatment or other patient management decisions. A negative result may occur with  improper specimen collection/handling, submission of specimen other than nasopharyngeal swab, presence of viral mutation(s) within the areas targeted by this assay, and inadequate number of viral copies(<138 copies/mL). A negative result must be combined with clinical observations, patient history, and epidemiological information. The expected result is Negative.  Fact Sheet for Patients:  EntrepreneurPulse.com.au  Fact Sheet for Healthcare Providers:  IncredibleEmployment.be  This test is no t yet approved or cleared by the [place] FDA and  has been authorized for detection and/or diagnosis of SARS-CoV-2 by FDA under an Emergency Use Authorization (EUA). This EUA will remain  in effect (meaning this test can be used) for the duration of the COVID-19 declaration under Section 564(b)(1) of the Act, 21 U.S.C.section 360bbb-3(b)(1), unless the authorization is terminated  or revoked sooner.       Influenza A by PCR NEGATIVE NEGATIVE Final   Influenza B by PCR NEGATIVE NEGATIVE Final    Comment: (NOTE) The Xpert Xpress SARS-CoV-2/FLU/RSV plus assay is intended as an aid in the diagnosis of influenza from Nasopharyngeal swab specimens and should not be used as a sole basis for treatment. Nasal washings and aspirates are unacceptable for Xpert Xpress  SARS-CoV-2/FLU/RSV testing.  Fact Sheet for Patients: EntrepreneurPulse.com.au  Fact Sheet for Healthcare Providers: IncredibleEmployment.be  This test is not yet approved or cleared by the [place] FDA and has been authorized for detection and/or diagnosis of SARS-CoV-2 by FDA under an Emergency Use Authorization (EUA). This EUA will remain in effect (meaning this test can be used) for the duration of the COVID-19 declaration under Section 564(b)(1) of the Act, 21 U.S.C. section 360bbb-3(b)(1), unless the authorization is terminated or revoked.  Performed at [facility], [place] [address], [place], [place] [address]   MRSA PCR Screening     Status: None   Collection Time: 10/05/20  7:38 PM   Specimen: Nasal Mucosa; Nasopharyngeal  Result Value Ref Range Status   MRSA by PCR NEGATIVE NEGATIVE Final    Comment:        The GeneXpert MRSA Assay (FDA approved for NASAL specimens only), is one component of a comprehensive MRSA colonization surveillance program. It is not intended to diagnose MRSA infection nor to guide or monitor treatment for MRSA infections. Performed at [facility], [place] [address], [place], [place] [address]   Culture, Urine     Status: Abnormal   Collection Time: 10/07/20 12:54 PM   Specimen: Urine, Catheterized  Result Value Ref Range Status   Specimen Description   Final  URINE, CATHETERIZED Performed at [facility], [place] [address] [place], [place] [address]    Special Requests   Final    NONE Performed at [facility], [place] [address] [place],  [address]    Culture (A)  Final    >=100,000 COLONIES/mL ACINETOBACTER CALCOACETICUS/BAUMANNII COMPLEX   Report Status 10/09/2020 FINAL  Final   Organism ID, Bacteria ACINETOBACTER CALCOACETICUS/BAUMANNII COMPLEX (A)  Final      Susceptibility   Acinetobacter  calcoaceticus/baumannii complex - MIC*    CEFTAZIDIME 4 SENSITIVE Sensitive     CIPROFLOXACIN <=0.25 SENSITIVE Sensitive     GENTAMICIN <=1 SENSITIVE Sensitive     IMIPENEM <=0.25 SENSITIVE Sensitive     PIP/TAZO <=4 SENSITIVE Sensitive     TRIMETH/SULFA <=20 SENSITIVE Sensitive     AMPICILLIN/SULBACTAM <=2 SENSITIVE Sensitive     * >=100,000 COLONIES/mL ACINETOBACTER CALCOACETICUS/BAUMANNII COMPLEX     Labs: BNP (last 3 results) No results for input(s): BNP in the last 8760 hours. Basic Metabolic Panel: Recent Labs  Lab 10/05/20 1335 10/05/20 1714 10/07/20 0431 10/07/20 0254 10/07/20 2027 10/08/20 0031 10/08/20 1822 10/09/20 0102 10/09/20 0424 10/09/20 2706 10/09/20 2121 10/10/20 0046 10/10/20 0459 10/10/20 0912 10/10/20 1305  NA 108*   < > 117*   < > 122* 121* 123*   < > 124*   < > 125* 123* 124* 124* 123*  K 5.5*   < > 4.1   < > 4.0 4.0 3.7  --  4.2  --   --   --  4.2  --   --   CL 78*   < > 88*   < > 92* 91* 92*  --  95*  --   --   --  96*  --   --   CO2 21*   < > 20*   < > 21* 23 19*  --  21*  --   --   --  22  --   --   GLUCOSE 114*   < > 96   < > 133* 113* 171*  --  101*  --   --   --  99  --   --   BUN 31*   < > 32*   < > 31* 31* 29*  --  31*  --   --   --  30*  --   --   CREATININE 1.50*   < > 1.28*   < > 1.33* 1.20* 1.40*  --  1.37*  --   --   --  1.39*  --   --   CALCIUM 8.7*   < > 8.2*   < > 8.2* 8.1* 8.0*  --  8.2*  --   --   --  8.0*  --   --   MG 1.7  --  1.5*  --   --   --   --   --  1.6*  --   --   --  1.8  --   --   PHOS 3.7  --  3.7  --   --   --   --   --  2.9  --   --   --   --   --   --    < > = values in this interval not displayed.   Liver Function Tests: No results for input(s): AST, ALT, ALKPHOS, BILITOT, PROT, ALBUMIN in the last 168 hours. No results for input(s): LIPASE, AMYLASE  in the last 168 hours. No results for input(s): AMMONIA in the last 168 hours. CBC: Recent Labs  Lab 10/05/20 1335 10/06/20 0419 10/07/20 0431 10/08/20 0031  10/09/20 0424 10/10/20 0459  WBC 10.1 8.4 9.7 9.7 9.3 9.5  NEUTROABS 6.8  --   --   --   --   --   HGB 9.3* 8.8* 9.0* 8.6* 8.2* 7.8*  HCT 26.7* 25.3* 26.4* 25.5* 24.3* 23.5*  MCV 88.1 89.4 89.8 91.4 92.4 91.8  PLT 500* 409* 439* 470* 387 435*   Cardiac Enzymes: No results for input(s): CKTOTAL, CKMB, CKMBINDEX, TROPONINI in the last 168 hours. BNP: Invalid input(s): POCBNP CBG: No results for input(s): GLUCAP in the last 168 hours. D-Dimer No results for input(s): DDIMER in the last 72 hours. Hgb A1c No results for input(s): HGBA1C in the last 72 hours. Lipid Profile No results for input(s): CHOL, HDL, LDLCALC, TRIG, CHOLHDL, LDLDIRECT in the last 72 hours. Thyroid function studies No results for input(s): TSH, T4TOTAL, T3FREE, THYROIDAB in the last 72 hours.  Invalid input(s): FREET3 Anemia work up No results for input(s): VITAMINB12, FOLATE, FERRITIN, TIBC, IRON, RETICCTPCT in the last 72 hours. Urinalysis    Component Value Date/Time   COLORURINE YELLOW 10/07/2020 1034   APPEARANCEUR TURBID (A) 10/07/2020 1034   LABSPEC 1.008 10/07/2020 1034   PHURINE 6.0 10/07/2020 1034   GLUCOSEU 50 (A) 10/07/2020 1034   HGBUR MODERATE (A) 10/07/2020 1034   BILIRUBINUR NEGATIVE 10/07/2020 [place] 10/07/2020 1034   PROTEINUR 100 (A) 10/07/2020 1034   UROBILINOGEN 0.2 12/12/2014 1940   NITRITE NEGATIVE 10/07/2020 1034   LEUKOCYTESUR LARGE (A) 10/07/2020 1034   Sepsis Labs Invalid input(s): PROCALCITONIN,  WBC,  LACTICIDVEN Microbiology Recent Results (from the past 240 hour(s))  Resp Panel by RT-PCR (Flu A&B, Covid) Nasopharyngeal Swab     Status: None   Collection Time: 10/05/20  2:34 PM   Specimen: Nasopharyngeal Swab; Nasopharyngeal(NP) swabs in vial transport medium  Result Value Ref Range Status   SARS Coronavirus 2 by RT PCR NEGATIVE NEGATIVE Final    Comment: (NOTE) SARS-CoV-2 target nucleic acids are NOT DETECTED.  The SARS-CoV-2 RNA is generally  detectable in upper respiratory specimens during the acute phase of infection. The lowest concentration of SARS-CoV-2 viral copies this assay can detect is 138 copies/mL. A negative result does not preclude SARS-Cov-2 infection and should not be used as the sole basis for treatment or other patient management decisions. A negative result may occur with  improper specimen collection/handling, submission of specimen other than nasopharyngeal swab, presence of viral mutation(s) within the areas targeted by this assay, and inadequate number of viral copies(<138 copies/mL). A negative result must be combined with clinical observations, patient history, and epidemiological information. The expected result is Negative.  Fact Sheet for Patients:  EntrepreneurPulse.com.au  Fact Sheet for Healthcare Providers:  IncredibleEmployment.be  This test is no t yet approved or cleared by the [place] FDA and  has been authorized for detection and/or diagnosis of SARS-CoV-2 by FDA under an Emergency Use Authorization (EUA). This EUA will remain  in effect (meaning this test can be used) for the duration of the COVID-19 declaration under Section 564(b)(1) of the Act, 21 U.S.C.section 360bbb-3(b)(1), unless the authorization is terminated  or revoked sooner.       Influenza A by PCR NEGATIVE NEGATIVE Final   Influenza B by PCR NEGATIVE NEGATIVE Final    Comment: (NOTE) The Xpert Xpress SARS-CoV-2/FLU/RSV plus assay is intended  as an aid in the diagnosis of influenza from Nasopharyngeal swab specimens and should not be used as a sole basis for treatment. Nasal washings and aspirates are unacceptable for Xpert Xpress SARS-CoV-2/FLU/RSV testing.  Fact Sheet for Patients: EntrepreneurPulse.com.au  Fact Sheet for Healthcare Providers: IncredibleEmployment.be  This test is not yet approved or cleared by the [place] FDA  and has been authorized for detection and/or diagnosis of SARS-CoV-2 by FDA under an Emergency Use Authorization (EUA). This EUA will remain in effect (meaning this test can be used) for the duration of the COVID-19 declaration under Section 564(b)(1) of the Act, 21 U.S.C. section 360bbb-3(b)(1), unless the authorization is terminated or revoked.  Performed at [facility], [place] [address], [place], [place] [address]   MRSA PCR Screening     Status: None   Collection Time: 10/05/20  7:38 PM   Specimen: Nasal Mucosa; Nasopharyngeal  Result Value Ref Range Status   MRSA by PCR NEGATIVE NEGATIVE Final    Comment:        The GeneXpert MRSA Assay (FDA approved for NASAL specimens only), is one component of a comprehensive MRSA colonization surveillance program. It is not intended to diagnose MRSA infection nor to guide or monitor treatment for MRSA infections. Performed at [facility], [place] [address] [place], [place] [address]   Culture, Urine     Status: Abnormal   Collection Time: 10/07/20 12:54 PM   Specimen: Urine, Catheterized  Result Value Ref Range Status   Specimen Description   Final    URINE, CATHETERIZED Performed at [facility] [address] [place], [place] [address]    Special Requests   Final    NONE Performed at [facility], [place] [address] [place], [place] [address]    Culture (A)  Final    >=100,000 COLONIES/mL ACINETOBACTER CALCOACETICUS/BAUMANNII COMPLEX   Report Status 10/09/2020 FINAL  Final   Organism ID, Bacteria ACINETOBACTER CALCOACETICUS/BAUMANNII COMPLEX (A)  Final      Susceptibility   Acinetobacter calcoaceticus/baumannii complex - MIC*    CEFTAZIDIME 4 SENSITIVE Sensitive     CIPROFLOXACIN <=0.25 SENSITIVE Sensitive     GENTAMICIN <=1 SENSITIVE Sensitive     IMIPENEM <=0.25 SENSITIVE Sensitive     PIP/TAZO <=4 SENSITIVE Sensitive     TRIMETH/SULFA <=20  SENSITIVE Sensitive     AMPICILLIN/SULBACTAM <=2 SENSITIVE Sensitive     * >=100,000 COLONIES/mL ACINETOBACTER CALCOACETICUS/BAUMANNII COMPLEX     Time coordinating discharge: 45 minutes  SIGNED:   Tawni Millers, MD  [facility] 10/10/2020, 3:13 PM

## 2020-10-18 DIAGNOSIS — E871 Hypo-osmolality and hyponatremia: Secondary | ICD-10-CM | POA: Diagnosis not present

## 2020-10-18 DIAGNOSIS — N179 Acute kidney failure, unspecified: Secondary | ICD-10-CM | POA: Diagnosis not present

## 2020-10-18 DIAGNOSIS — D649 Anemia, unspecified: Secondary | ICD-10-CM | POA: Diagnosis not present

## 2020-10-18 DIAGNOSIS — E785 Hyperlipidemia, unspecified: Secondary | ICD-10-CM | POA: Diagnosis not present

## 2020-10-18 DIAGNOSIS — R112 Nausea with vomiting, unspecified: Secondary | ICD-10-CM | POA: Diagnosis not present

## 2020-10-18 DIAGNOSIS — I129 Hypertensive chronic kidney disease with stage 1 through stage 4 chronic kidney disease, or unspecified chronic kidney disease: Secondary | ICD-10-CM | POA: Diagnosis not present

## 2020-10-18 DIAGNOSIS — D509 Iron deficiency anemia, unspecified: Secondary | ICD-10-CM | POA: Diagnosis not present

## 2020-10-18 DIAGNOSIS — M109 Gout, unspecified: Secondary | ICD-10-CM | POA: Diagnosis not present

## 2020-10-18 DIAGNOSIS — N390 Urinary tract infection, site not specified: Secondary | ICD-10-CM | POA: Diagnosis not present

## 2020-10-18 DIAGNOSIS — N184 Chronic kidney disease, stage 4 (severe): Secondary | ICD-10-CM | POA: Diagnosis not present

## 2020-10-18 DIAGNOSIS — E875 Hyperkalemia: Secondary | ICD-10-CM | POA: Diagnosis not present

## 2020-10-25 DIAGNOSIS — D473 Essential (hemorrhagic) thrombocythemia: Secondary | ICD-10-CM | POA: Diagnosis not present

## 2020-10-26 DIAGNOSIS — N184 Chronic kidney disease, stage 4 (severe): Secondary | ICD-10-CM | POA: Diagnosis not present

## 2020-10-26 DIAGNOSIS — G9341 Metabolic encephalopathy: Secondary | ICD-10-CM | POA: Diagnosis not present

## 2020-10-26 DIAGNOSIS — R110 Nausea: Secondary | ICD-10-CM | POA: Diagnosis not present

## 2020-10-26 DIAGNOSIS — R509 Fever, unspecified: Secondary | ICD-10-CM | POA: Diagnosis not present

## 2020-10-26 DIAGNOSIS — N179 Acute kidney failure, unspecified: Secondary | ICD-10-CM | POA: Diagnosis not present

## 2020-10-26 DIAGNOSIS — E871 Hypo-osmolality and hyponatremia: Secondary | ICD-10-CM | POA: Diagnosis not present

## 2020-10-26 DIAGNOSIS — M109 Gout, unspecified: Secondary | ICD-10-CM | POA: Diagnosis not present

## 2020-10-26 DIAGNOSIS — I959 Hypotension, unspecified: Secondary | ICD-10-CM | POA: Diagnosis not present

## 2020-10-26 DIAGNOSIS — I129 Hypertensive chronic kidney disease with stage 1 through stage 4 chronic kidney disease, or unspecified chronic kidney disease: Secondary | ICD-10-CM | POA: Diagnosis not present

## 2020-10-26 DIAGNOSIS — N390 Urinary tract infection, site not specified: Secondary | ICD-10-CM | POA: Diagnosis not present

## 2020-10-26 DIAGNOSIS — R000 Tachycardia, unspecified: Secondary | ICD-10-CM | POA: Diagnosis not present

## 2020-10-26 DIAGNOSIS — R1111 Vomiting without nausea: Secondary | ICD-10-CM | POA: Diagnosis not present

## 2020-10-26 DIAGNOSIS — Z87440 Personal history of urinary (tract) infections: Secondary | ICD-10-CM | POA: Diagnosis not present

## 2020-10-26 DIAGNOSIS — R112 Nausea with vomiting, unspecified: Secondary | ICD-10-CM | POA: Diagnosis not present

## 2020-10-29 DIAGNOSIS — Z9181 History of falling: Secondary | ICD-10-CM | POA: Diagnosis not present

## 2020-10-29 DIAGNOSIS — I10 Essential (primary) hypertension: Secondary | ICD-10-CM | POA: Diagnosis not present

## 2020-10-29 DIAGNOSIS — N179 Acute kidney failure, unspecified: Secondary | ICD-10-CM | POA: Diagnosis not present

## 2020-10-29 DIAGNOSIS — L89312 Pressure ulcer of right buttock, stage 2: Secondary | ICD-10-CM | POA: Diagnosis not present

## 2020-10-29 DIAGNOSIS — E871 Hypo-osmolality and hyponatremia: Secondary | ICD-10-CM | POA: Diagnosis not present

## 2020-10-29 DIAGNOSIS — M109 Gout, unspecified: Secondary | ICD-10-CM | POA: Diagnosis not present

## 2020-10-29 DIAGNOSIS — E876 Hypokalemia: Secondary | ICD-10-CM | POA: Diagnosis not present

## 2020-10-29 DIAGNOSIS — N390 Urinary tract infection, site not specified: Secondary | ICD-10-CM | POA: Diagnosis not present

## 2020-11-01 DIAGNOSIS — E875 Hyperkalemia: Secondary | ICD-10-CM | POA: Diagnosis not present

## 2020-11-01 DIAGNOSIS — D649 Anemia, unspecified: Secondary | ICD-10-CM | POA: Diagnosis not present

## 2020-11-01 DIAGNOSIS — E871 Hypo-osmolality and hyponatremia: Secondary | ICD-10-CM | POA: Diagnosis not present

## 2020-11-01 DIAGNOSIS — N184 Chronic kidney disease, stage 4 (severe): Secondary | ICD-10-CM | POA: Diagnosis not present

## 2020-11-01 DIAGNOSIS — R2681 Unsteadiness on feet: Secondary | ICD-10-CM | POA: Diagnosis not present

## 2020-11-01 DIAGNOSIS — N179 Acute kidney failure, unspecified: Secondary | ICD-10-CM | POA: Diagnosis not present

## 2020-11-01 DIAGNOSIS — I129 Hypertensive chronic kidney disease with stage 1 through stage 4 chronic kidney disease, or unspecified chronic kidney disease: Secondary | ICD-10-CM | POA: Diagnosis not present

## 2020-11-02 DIAGNOSIS — N179 Acute kidney failure, unspecified: Secondary | ICD-10-CM | POA: Diagnosis not present

## 2020-11-02 DIAGNOSIS — E871 Hypo-osmolality and hyponatremia: Secondary | ICD-10-CM | POA: Diagnosis not present

## 2020-11-02 DIAGNOSIS — N390 Urinary tract infection, site not specified: Secondary | ICD-10-CM | POA: Diagnosis not present

## 2020-11-02 DIAGNOSIS — L89312 Pressure ulcer of right buttock, stage 2: Secondary | ICD-10-CM | POA: Diagnosis not present

## 2020-11-02 DIAGNOSIS — Z9181 History of falling: Secondary | ICD-10-CM | POA: Diagnosis not present

## 2020-11-02 DIAGNOSIS — M109 Gout, unspecified: Secondary | ICD-10-CM | POA: Diagnosis not present

## 2020-11-02 DIAGNOSIS — E876 Hypokalemia: Secondary | ICD-10-CM | POA: Diagnosis not present

## 2020-11-02 DIAGNOSIS — I10 Essential (primary) hypertension: Secondary | ICD-10-CM | POA: Diagnosis not present

## 2020-11-03 DIAGNOSIS — L89312 Pressure ulcer of right buttock, stage 2: Secondary | ICD-10-CM | POA: Diagnosis not present

## 2020-11-03 DIAGNOSIS — E871 Hypo-osmolality and hyponatremia: Secondary | ICD-10-CM | POA: Diagnosis not present

## 2020-11-03 DIAGNOSIS — Z9181 History of falling: Secondary | ICD-10-CM | POA: Diagnosis not present

## 2020-11-03 DIAGNOSIS — N390 Urinary tract infection, site not specified: Secondary | ICD-10-CM | POA: Diagnosis not present

## 2020-11-03 DIAGNOSIS — N179 Acute kidney failure, unspecified: Secondary | ICD-10-CM | POA: Diagnosis not present

## 2020-11-03 DIAGNOSIS — E876 Hypokalemia: Secondary | ICD-10-CM | POA: Diagnosis not present

## 2020-11-03 DIAGNOSIS — I10 Essential (primary) hypertension: Secondary | ICD-10-CM | POA: Diagnosis not present

## 2020-11-03 DIAGNOSIS — M109 Gout, unspecified: Secondary | ICD-10-CM | POA: Diagnosis not present

## 2020-11-04 DIAGNOSIS — N179 Acute kidney failure, unspecified: Secondary | ICD-10-CM | POA: Diagnosis not present

## 2020-11-04 DIAGNOSIS — E871 Hypo-osmolality and hyponatremia: Secondary | ICD-10-CM | POA: Diagnosis not present

## 2020-11-04 DIAGNOSIS — M109 Gout, unspecified: Secondary | ICD-10-CM | POA: Diagnosis not present

## 2020-11-04 DIAGNOSIS — N390 Urinary tract infection, site not specified: Secondary | ICD-10-CM | POA: Diagnosis not present

## 2020-11-04 DIAGNOSIS — I10 Essential (primary) hypertension: Secondary | ICD-10-CM | POA: Diagnosis not present

## 2020-11-04 DIAGNOSIS — L89312 Pressure ulcer of right buttock, stage 2: Secondary | ICD-10-CM | POA: Diagnosis not present

## 2020-11-04 DIAGNOSIS — Z9181 History of falling: Secondary | ICD-10-CM | POA: Diagnosis not present

## 2020-11-04 DIAGNOSIS — E876 Hypokalemia: Secondary | ICD-10-CM | POA: Diagnosis not present

## 2020-11-05 DIAGNOSIS — L89312 Pressure ulcer of right buttock, stage 2: Secondary | ICD-10-CM | POA: Diagnosis not present

## 2020-11-05 DIAGNOSIS — Z9181 History of falling: Secondary | ICD-10-CM | POA: Diagnosis not present

## 2020-11-05 DIAGNOSIS — N179 Acute kidney failure, unspecified: Secondary | ICD-10-CM | POA: Diagnosis not present

## 2020-11-05 DIAGNOSIS — M109 Gout, unspecified: Secondary | ICD-10-CM | POA: Diagnosis not present

## 2020-11-05 DIAGNOSIS — E871 Hypo-osmolality and hyponatremia: Secondary | ICD-10-CM | POA: Diagnosis not present

## 2020-11-05 DIAGNOSIS — I10 Essential (primary) hypertension: Secondary | ICD-10-CM | POA: Diagnosis not present

## 2020-11-05 DIAGNOSIS — E876 Hypokalemia: Secondary | ICD-10-CM | POA: Diagnosis not present

## 2020-11-05 DIAGNOSIS — N390 Urinary tract infection, site not specified: Secondary | ICD-10-CM | POA: Diagnosis not present

## 2020-11-05 DIAGNOSIS — D473 Essential (hemorrhagic) thrombocythemia: Secondary | ICD-10-CM | POA: Diagnosis not present

## 2020-11-08 DIAGNOSIS — Z9181 History of falling: Secondary | ICD-10-CM | POA: Diagnosis not present

## 2020-11-08 DIAGNOSIS — R5383 Other fatigue: Secondary | ICD-10-CM | POA: Diagnosis not present

## 2020-11-08 DIAGNOSIS — M109 Gout, unspecified: Secondary | ICD-10-CM | POA: Diagnosis not present

## 2020-11-08 DIAGNOSIS — E876 Hypokalemia: Secondary | ICD-10-CM | POA: Diagnosis not present

## 2020-11-08 DIAGNOSIS — L89312 Pressure ulcer of right buttock, stage 2: Secondary | ICD-10-CM | POA: Diagnosis not present

## 2020-11-08 DIAGNOSIS — I129 Hypertensive chronic kidney disease with stage 1 through stage 4 chronic kidney disease, or unspecified chronic kidney disease: Secondary | ICD-10-CM | POA: Diagnosis not present

## 2020-11-08 DIAGNOSIS — N179 Acute kidney failure, unspecified: Secondary | ICD-10-CM | POA: Diagnosis not present

## 2020-11-08 DIAGNOSIS — N184 Chronic kidney disease, stage 4 (severe): Secondary | ICD-10-CM | POA: Diagnosis not present

## 2020-11-08 DIAGNOSIS — E871 Hypo-osmolality and hyponatremia: Secondary | ICD-10-CM | POA: Diagnosis not present

## 2020-11-08 DIAGNOSIS — E875 Hyperkalemia: Secondary | ICD-10-CM | POA: Diagnosis not present

## 2020-11-08 DIAGNOSIS — I10 Essential (primary) hypertension: Secondary | ICD-10-CM | POA: Diagnosis not present

## 2020-11-08 DIAGNOSIS — N390 Urinary tract infection, site not specified: Secondary | ICD-10-CM | POA: Diagnosis not present

## 2020-11-08 DIAGNOSIS — R69 Illness, unspecified: Secondary | ICD-10-CM | POA: Diagnosis not present

## 2020-11-09 ENCOUNTER — Encounter (HOSPITAL_COMMUNITY): Payer: Self-pay

## 2020-11-09 ENCOUNTER — Inpatient Hospital Stay (HOSPITAL_COMMUNITY)
Admission: EM | Admit: 2020-11-09 | Discharge: 2020-11-12 | DRG: 682 | Disposition: A | Discharge status: Home Health | Payer: Medicare PPO | Attending: Internal Medicine | Admitting: Internal Medicine

## 2020-11-09 ENCOUNTER — Emergency Department (HOSPITAL_COMMUNITY): Payer: Medicare PPO

## 2020-11-09 DIAGNOSIS — N136 Pyonephrosis: Secondary | ICD-10-CM | POA: Diagnosis present

## 2020-11-09 DIAGNOSIS — Z981 Arthrodesis status: Secondary | ICD-10-CM | POA: Diagnosis not present

## 2020-11-09 DIAGNOSIS — E869 Volume depletion, unspecified: Secondary | ICD-10-CM | POA: Diagnosis present

## 2020-11-09 DIAGNOSIS — N3000 Acute cystitis without hematuria: Secondary | ICD-10-CM

## 2020-11-09 DIAGNOSIS — E222 Syndrome of inappropriate secretion of antidiuretic hormone: Secondary | ICD-10-CM

## 2020-11-09 DIAGNOSIS — N170 Acute kidney failure with tubular necrosis: Principal | ICD-10-CM | POA: Diagnosis present

## 2020-11-09 DIAGNOSIS — T501X5A Adverse effect of loop [high-ceiling] diuretics, initial encounter: Secondary | ICD-10-CM | POA: Diagnosis present

## 2020-11-09 DIAGNOSIS — N1832 Chronic kidney disease, stage 3b: Secondary | ICD-10-CM | POA: Diagnosis present

## 2020-11-09 DIAGNOSIS — R000 Tachycardia, unspecified: Secondary | ICD-10-CM | POA: Diagnosis present

## 2020-11-09 DIAGNOSIS — B965 Pseudomonas (aeruginosa) (mallei) (pseudomallei) as the cause of diseases classified elsewhere: Secondary | ICD-10-CM | POA: Diagnosis present

## 2020-11-09 DIAGNOSIS — I10 Essential (primary) hypertension: Secondary | ICD-10-CM | POA: Diagnosis not present

## 2020-11-09 DIAGNOSIS — R4182 Altered mental status, unspecified: Secondary | ICD-10-CM | POA: Diagnosis not present

## 2020-11-09 DIAGNOSIS — N179 Acute kidney failure, unspecified: Secondary | ICD-10-CM | POA: Diagnosis present

## 2020-11-09 DIAGNOSIS — G9341 Metabolic encephalopathy: Secondary | ICD-10-CM | POA: Diagnosis present

## 2020-11-09 DIAGNOSIS — L89150 Pressure ulcer of sacral region, unstageable: Secondary | ICD-10-CM | POA: Diagnosis present

## 2020-11-09 DIAGNOSIS — I129 Hypertensive chronic kidney disease with stage 1 through stage 4 chronic kidney disease, or unspecified chronic kidney disease: Secondary | ICD-10-CM | POA: Diagnosis present

## 2020-11-09 DIAGNOSIS — N3010 Interstitial cystitis (chronic) without hematuria: Secondary | ICD-10-CM | POA: Diagnosis present

## 2020-11-09 DIAGNOSIS — Z20822 Contact with and (suspected) exposure to covid-19: Secondary | ICD-10-CM | POA: Diagnosis present

## 2020-11-09 DIAGNOSIS — E875 Hyperkalemia: Secondary | ICD-10-CM | POA: Diagnosis present

## 2020-11-09 DIAGNOSIS — Z85828 Personal history of other malignant neoplasm of skin: Secondary | ICD-10-CM | POA: Diagnosis not present

## 2020-11-09 DIAGNOSIS — N390 Urinary tract infection, site not specified: Secondary | ICD-10-CM | POA: Diagnosis not present

## 2020-11-09 DIAGNOSIS — Z66 Do not resuscitate: Secondary | ICD-10-CM | POA: Diagnosis present

## 2020-11-09 DIAGNOSIS — Z9841 Cataract extraction status, right eye: Secondary | ICD-10-CM

## 2020-11-09 DIAGNOSIS — Z87440 Personal history of urinary (tract) infections: Secondary | ICD-10-CM

## 2020-11-09 DIAGNOSIS — E871 Hypo-osmolality and hyponatremia: Secondary | ICD-10-CM

## 2020-11-09 DIAGNOSIS — N1831 Chronic kidney disease, stage 3a: Secondary | ICD-10-CM | POA: Diagnosis not present

## 2020-11-09 DIAGNOSIS — R5381 Other malaise: Secondary | ICD-10-CM | POA: Diagnosis present

## 2020-11-09 DIAGNOSIS — N183 Chronic kidney disease, stage 3 unspecified: Secondary | ICD-10-CM | POA: Diagnosis not present

## 2020-11-09 DIAGNOSIS — D649 Anemia, unspecified: Secondary | ICD-10-CM | POA: Diagnosis not present

## 2020-11-09 DIAGNOSIS — E785 Hyperlipidemia, unspecified: Secondary | ICD-10-CM | POA: Diagnosis present

## 2020-11-09 DIAGNOSIS — K219 Gastro-esophageal reflux disease without esophagitis: Secondary | ICD-10-CM | POA: Diagnosis present

## 2020-11-09 DIAGNOSIS — D631 Anemia in chronic kidney disease: Secondary | ICD-10-CM | POA: Diagnosis present

## 2020-11-09 DIAGNOSIS — Z2239 Carrier of other specified bacterial diseases: Secondary | ICD-10-CM

## 2020-11-09 DIAGNOSIS — R531 Weakness: Secondary | ICD-10-CM | POA: Diagnosis not present

## 2020-11-09 DIAGNOSIS — Z79899 Other long term (current) drug therapy: Secondary | ICD-10-CM

## 2020-11-09 DIAGNOSIS — Z961 Presence of intraocular lens: Secondary | ICD-10-CM | POA: Diagnosis present

## 2020-11-09 DIAGNOSIS — N1330 Unspecified hydronephrosis: Secondary | ICD-10-CM | POA: Diagnosis not present

## 2020-11-09 LAB — URINALYSIS, ROUTINE W REFLEX MICROSCOPIC
Bilirubin Urine: NEGATIVE
Glucose, UA: NEGATIVE mg/dL
Ketones, ur: NEGATIVE mg/dL
Nitrite: NEGATIVE
Protein, ur: 100 mg/dL — AB
RBC / HPF: >50 RBC/hpf — ABNORMAL HIGH (ref 0–5)
Specific Gravity, Urine: 1.008 (ref 1.005–1.030)
WBC, UA: >50 WBC/hpf — ABNORMAL HIGH (ref 0–5)
pH: 7 (ref 5.0–8.0)

## 2020-11-09 LAB — BASIC METABOLIC PANEL
Anion gap: 9 (ref 5–15)
BUN: 26 mg/dL — ABNORMAL HIGH (ref 8–23)
CO2: 22 mmol/L (ref 22–32)
Calcium: 8.4 mg/dL — ABNORMAL LOW (ref 8.9–10.3)
Chloride: 93 mmol/L — ABNORMAL LOW (ref 98–111)
Creatinine, Ser: 2.52 mg/dL — ABNORMAL HIGH (ref 0.44–1.00)
GFR, Estimated: 18 mL/min — ABNORMAL LOW (ref >60)
Glucose, Bld: 174 mg/dL — ABNORMAL HIGH (ref 70–99)
Potassium: 5 mmol/L (ref 3.5–5.1)
Sodium: 124 mmol/L — ABNORMAL LOW (ref 135–145)

## 2020-11-09 LAB — HEPATIC FUNCTION PANEL
ALT: 15 U/L (ref 0–44)
AST: 18 U/L (ref 15–41)
Albumin: 3.1 g/dL — ABNORMAL LOW (ref 3.5–5.0)
Alkaline Phosphatase: 82 U/L (ref 38–126)
Bilirubin, Direct: 0.1 mg/dL (ref 0.0–0.2)
Indirect Bilirubin: 0.5 mg/dL (ref 0.3–0.9)
Total Bilirubin: 0.6 mg/dL (ref 0.3–1.2)
Total Protein: 7.4 g/dL (ref 6.5–8.1)

## 2020-11-09 LAB — CBC WITH DIFFERENTIAL/PLATELET
Abs Immature Granulocytes: 0.08 10*3/uL — ABNORMAL HIGH (ref 0.00–0.07)
Basophils Absolute: 0 10*3/uL (ref 0.0–0.1)
Basophils Relative: 0 %
Eosinophils Absolute: 0 10*3/uL (ref 0.0–0.5)
Eosinophils Relative: 0 %
HCT: 27.2 % — ABNORMAL LOW (ref 36.0–46.0)
Hemoglobin: 8.9 g/dL — ABNORMAL LOW (ref 12.0–15.0)
Immature Granulocytes: 1 %
Lymphocytes Relative: 10 %
Lymphs Abs: 1.5 10*3/uL (ref 0.7–4.0)
MCH: 30 pg (ref 26.0–34.0)
MCHC: 32.7 g/dL (ref 30.0–36.0)
MCV: 91.6 fL (ref 80.0–100.0)
Monocytes Absolute: 1.6 10*3/uL — ABNORMAL HIGH (ref 0.1–1.0)
Monocytes Relative: 10 %
Neutro Abs: 12.1 10*3/uL — ABNORMAL HIGH (ref 1.7–7.7)
Neutrophils Relative %: 79 %
Platelets: 462 10*3/uL — ABNORMAL HIGH (ref 150–400)
RBC: 2.97 MIL/uL — ABNORMAL LOW (ref 3.87–5.11)
RDW: 15.4 % (ref 11.5–15.5)
WBC: 15.3 10*3/uL — ABNORMAL HIGH (ref 4.0–10.5)
nRBC: 0 % (ref 0.0–0.2)

## 2020-11-09 LAB — CREATININE, URINE, RANDOM: Creatinine, Urine: 51.73 mg/dL

## 2020-11-09 LAB — RESP PANEL BY RT-PCR (FLU A&B, COVID) ARPGX2
Influenza A by PCR: NEGATIVE
Influenza B by PCR: NEGATIVE
SARS Coronavirus 2 by RT PCR: NEGATIVE

## 2020-11-09 LAB — NA AND K (SODIUM & POTASSIUM), RAND UR
Potassium Urine: 27 mmol/L
Sodium, Ur: 66 mmol/L

## 2020-11-09 MED ORDER — SODIUM CHLORIDE 0.9 % IV SOLN
1.0000 g | INTRAVENOUS | Status: DC
  Administered 2020-11-09 – 2020-11-10 (×2): 1 g via INTRAVENOUS
  Filled 2020-11-09 (×2): qty 10

## 2020-11-09 MED ORDER — ONDANSETRON HCL 4 MG/2ML IJ SOLN: 4.0000 mg | Freq: Four times a day (QID) | INTRAMUSCULAR | Status: DC | PRN

## 2020-11-09 MED ORDER — HEPARIN SODIUM (PORCINE) 5000 UNIT/ML IJ SOLN
5000.0000 [IU] | Freq: Three times a day (TID) | INTRAMUSCULAR | Status: DC
  Administered 2020-11-10 – 2020-11-12 (×8): 5000 [IU] via SUBCUTANEOUS
  Filled 2020-11-09 (×8): qty 1

## 2020-11-09 MED ORDER — ONDANSETRON HCL 4 MG PO TABS: 4.0000 mg | ORAL_TABLET | Freq: Four times a day (QID) | ORAL | Status: DC | PRN

## 2020-11-09 MED ORDER — AMLODIPINE BESYLATE 5 MG PO TABS
5.0000 mg | ORAL_TABLET | Freq: Every day | ORAL | Status: DC
  Administered 2020-11-11 – 2020-11-12 (×2): 5 mg via ORAL
  Filled 2020-11-09 (×3): qty 1

## 2020-11-09 MED ORDER — ACETAMINOPHEN 325 MG PO TABS: 650.0000 mg | ORAL_TABLET | Freq: Four times a day (QID) | ORAL | Status: DC | PRN

## 2020-11-09 MED ORDER — ACETAMINOPHEN 650 MG RE SUPP: 650.0000 mg | Freq: Four times a day (QID) | RECTAL | Status: DC | PRN

## 2020-11-09 NOTE — ED Notes (Signed)
PER nephrologist, states labs done yesterday-low sodium-family bringing her to ED for eval

## 2020-11-09 NOTE — Consult Note (Addendum)
Reason for Consult: Hyponatremia and AKI/CKD stage IIIa Referring Physician: Alcario Drought, DO  [place] is an 87 y.o. female has a PMH significant for HTN, OA, recurrent UTI's, chronic bilateral hydronephrosis, interstitial cystitis, CKD stage IIIa, chronic hyponatremia who has been in and out of multiple hospitals over the past few months.  She had been seen in consultation for acute on chronic hyponatremia as well as AKI/CKD stage IIIa by our service on 10/05/20.  At that time she presented with N/V and found to have severe hyponatremia with Na of 108.  She was treated with 3% saline with slow and steady improvement of her sodium levels.  She was discharged to SNF on 10/10/20 with Sodium level of 124 and BUN/Cr of 30/1.39.  Labs drawn at the SNF on 11/08/20 were notable for Na of 120, K 5.8, BUN 20, Cr 2.18, CO2 24.  She was seen in our office earlier today and her family noted AMS and was concerned so she was sent to [facility] ED for further evaluation.  In the ED her vital signs were stable, although she was tachycardic at 111.  Repeat labs were notable for Na of 124, K 5, BUN 26, Cr 2.52, alb 3.1, WBC 15.3, Hgb 8.9, plt 462, and UA with moderate blood, large leukocytes, 100 prot, many WBC.  Renal US with persistent bilateral hydronephrosis without significant change from last hospitalization.  She was admitted for further evaluation and we were consulted to help evaluate and manage her AKI/CKD stage IIIa and hyponatremia.  The trend in Serum sodium is seen below.    Her daughter in-law was with her and reported that she limits her free water to 20 oz/day but does drink 6, 16 oz bottles of body armour sports drink (has added potassium).  She also reports that her furosemide was increased from 20 mg daily to 40 mg daily up until yesterday when it was decreased to 20 mg.  She denies any N/V/D, anorexia, hematochezia, melena, or BRBPR.  Trend in Serum Sodium: Sodium  Date/Time Value Ref Range Status   11/09/2020 05:25 PM 124 (L) 135 - 145 mmol/L Final  10/10/2020 01:05 PM 123 (L) 135 - 145 mmol/L Final  10/10/2020 09:12 AM 124 (L) 135 - 145 mmol/L Final  10/10/2020 04:59 AM 124 (L) 135 - 145 mmol/L Final  10/10/2020 12:46 AM 123 (L) 135 - 145 mmol/L Final  10/09/2020 09:21 PM 125 (L) 135 - 145 mmol/L Final  10/09/2020 04:53 PM 124 (L) 135 - 145 mmol/L Final  10/09/2020 12:23 PM 124 (L) 135 - 145 mmol/L Final  10/09/2020 09:05 AM 123 (L) 135 - 145 mmol/L Final  10/09/2020 04:24 AM 124 (L) 135 - 145 mmol/L Final  10/09/2020 01:02 AM 124 (L) 135 - 145 mmol/L Final  10/08/2020 06:22 PM 123 (L) 135 - 145 mmol/L Final  10/08/2020 12:31 AM 121 (L) 135 - 145 mmol/L Final  10/07/2020 08:27 PM 122 (L) 135 - 145 mmol/L Final  10/07/2020 06:05 PM 121 (L) 135 - 145 mmol/L Final  10/07/2020 04:29 PM 121 (L) 135 - 145 mmol/L Final  10/07/2020 12:12 PM 119 (LL) 135 - 145 mmol/L Final  10/07/2020 09:06 AM 119 (LL) 135 - 145 mmol/L Final  10/07/2020 04:31 AM 117 (LL) 135 - 145 mmol/L Final  10/07/2020 12:33 AM 116 (LL) 135 - 145 mmol/L Final  10/06/2020 09:09 PM 115 (LL) 135 - 145 mmol/L Final  10/06/2020 04:21 PM 114 (LL) 135 - 145 mmol/L Final  10/06/2020 12:16  PM 113 (LL) 135 - 145 mmol/L Final  10/06/2020 08:07 AM 113 (LL) 135 - 145 mmol/L Final  10/06/2020 04:19 AM 117 (LL) 135 - 145 mmol/L Final  10/06/2020 12:35 AM 112 (LL) 135 - 145 mmol/L Final  10/05/2020 08:29 PM 110 (LL) 135 - 145 mmol/L Final  10/05/2020 05:14 PM 109 (LL) 135 - 145 mmol/L Final  10/05/2020 01:35 PM 108 (LL) 135 - 145 mmol/L Final  12/29/2015 07:37 AM 143 135 - 145 mmol/L Final  12/14/2014 05:17 AM 134 (L) 135 - 145 mmol/L Final  12/13/2014 05:10 AM 133 (L) 135 - 145 mmol/L Final  12/12/2014 05:40 PM 130 (L) 135 - 145 mmol/L Final  10/20/2014 12:59 PM 136 135 - 145 mmol/L Final  02/03/2011 07:15 AM 133 (L) 135 - 145 mEq/L Final  01/30/2011 06:14 AM 129 (L) 135 - 145 mEq/L Final  01/29/2011 05:25 AM 133 (L) 135 -  145 mEq/L Final  01/27/2011 10:48 AM 134 (L) 135 - 145 mEq/L Final    PMH:   Past Medical History:  Diagnosis Date   Erythematous bladder mucosa    Frequent urination    Gait instability    uses walker   GERD (gastroesophageal reflux disease)    History of basal cell carcinoma excision    yrs ago-  nose   History of gout    last bout yrs ago   History of recurrent UTIs    Hydronephrosis    Hyperlipidemia    Hypertension    Nocturia    Numbness and tingling of both legs    residual  from spinal stenosis and tumor  s/p  back surgery's  uses walker   OA (osteoarthritis)    Urge urinary incontinence    Wears glasses     PSH:   Past Surgical History:  Procedure Laterality Date   CARPAL TUNNEL RELEASE Right early 2000's   CATARACT EXTRACTION W/ INTRAOCULAR LENS IMPLANT Right yrs ago   [place]   C5 -- C6   CYSTOSCOPY W/ RETROGRADES Right 12/29/2015   Procedure: CYSTOSCOPY WITH RETROGRADE PYELOGRAM;  Surgeon: Alexis Frock, MD;  Location: [facility];  Service: Urology;  Laterality: Right;   CYSTOSCOPY WITH BIOPSY  12/29/2015   Procedure: CYSTOSCOPY WITH BIOPSY;  Surgeon: Alexis Frock, MD;  Location: [facility];  Service: Urology;;   [place] N/A 12/29/2015   Procedure: CYSTOSCOPY WITH FULGERATION;  Surgeon: Alexis Frock, MD;  Location: [facility];  Service: Urology;  Laterality: N/A;   KNEE ARTHROSCOPY Right 2010  approx   POSTERIOR LAMINECTOMY / DECOMPRESSION LUMBAR SPINE  10/28/2014   lam.  L2 -- L4/  decompression L2 -- L5/  fusion L4-5   THORACIC LAMINECTOMY  01/30/2011   lam. T9 -- T11 /  Resection Intraspinal extradural extramedullary tumor    Allergies: No Known Allergies  Medications:   Prior to Admission medications   Medication Sig Start Date End Date Taking? Authorizing Provider  acetaminophen (TYLENOL) 500 MG tablet Take 1,000 mg by mouth every 6 (six) hours as needed for  moderate pain.    Yes [provider]  allopurinol (ZYLOPRIM) 300 MG tablet Take 150 mg by mouth daily. 08/15/20  Yes [provider]  amLODipine (NORVASC) 5 MG tablet Take 5 mg by mouth daily. 09/26/20  Yes [provider]  Cyanocobalamin (VITAMIN B12) 1000 MCG TBCR Take 1,000 mcg by mouth daily.   Yes [provider]  diclofenac Sodium (VOLTAREN) 1 %  GEL APPLY 2 GRAMS TO AFFECTED AREA 4 TIMES A DAY Patient taking differently: Apply 2 g topically 2 (two) times daily as needed (pain). 01/05/20  Yes Leandrew Koyanagi, MD  Emollient (AQUAPHOR EX) Apply 1 application topically See admin instructions. 1 application each time going to bathroom   Yes [provider]  furosemide (LASIX) 20 MG tablet Take 1 tablet (20 mg total) by mouth daily for 15 days. Patient taking differently: Take 10 mg by mouth daily. 10/11/20 11/09/20 Yes Arrien, Jimmy Picket, MD  Multiple Vitamin (MULTIVITAMIN) tablet Take 1 tablet by mouth daily.   Yes [provider]  neomycin-bacitracin-polymyxin (NEOSPORIN) ointment Apply 1 application topically See admin instructions. 1 application each time going to bathroom   Yes [provider]  OVER THE COUNTER MEDICATION Take 1 tablet by mouth daily. Vitamin D3 + K 2 in coconut oil   Yes [provider]  Probiotic Product ([place]) CAPS Take 1 capsule by mouth daily.   Yes [provider]  sodium chloride 1 g tablet Take 1 g by mouth 2 (two) times daily with a meal.   Yes [provider]  triamcinolone cream (KENALOG) 0.1 % Apply 1 application topically 2 (two) times daily.   Yes [provider]  zinc gluconate 50 MG tablet Take 25 mg by mouth daily.   Yes [provider]    Discontinued Meds:   Medications Discontinued During This Encounter  Medication Reason   OVER THE COUNTER MEDICATION Patient Preference   cholecalciferol (VITAMIN D3) 25 MCG (1000 UNIT) tablet      Social History:  reports that she has never smoked. She has never used smokeless tobacco. She reports that she does not drink alcohol and does not use drugs.  Family History:   Family History  Problem Relation Age of Onset   Arthritis Mother    Cancer Father     Pertinent items are noted in HPI.  Blood pressure (!) 115/57, pulse (!) 102, temperature 97.8 F (36.6 C), temperature source Oral, resp. rate 18, height 5\' 2"  (1.575 m), weight 58.1 kg, SpO2 96 %. General appearance: cooperative, appears stated age, and no distress Head: Normocephalic, without obvious abnormality, atraumatic Resp: clear to auscultation bilaterally Cardio: tachycardic at 111 without rub GI: soft, non-tender; bowel sounds normal; no masses,  no organomegaly Extremities: extremities normal, atraumatic, no cyanosis or edema  Labs: Basic Metabolic Panel: Recent Labs  Lab 11/09/20 1725  NA 124*  K 5.0  CL 93*  CO2 22  GLUCOSE 174*  BUN 26*  CREATININE 2.52*  ALBUMIN 3.1*  CALCIUM 8.4*   Liver Function Tests: Recent Labs  Lab 11/09/20 1725  AST 18  ALT 15  ALKPHOS 82  BILITOT 0.6  PROT 7.4  ALBUMIN 3.1*   No results for input(s): LIPASE, AMYLASE in the last 168 hours. No results for input(s): AMMONIA in the last 168 hours. CBC: Recent Labs  Lab 11/09/20 1725  WBC 15.3*  NEUTROABS 12.1*  HGB 8.9*  HCT 27.2*  MCV 91.6  PLT 462*   PT/INR: @labrcntip (inr:5) Cardiac Enzymes: No results for input(s): CKTOTAL, CKMB, CKMBINDEX, TROPONINI in the last 168 hours. CBG: No results for input(s): GLUCAP in the last 168 hours.  Iron Studies: No results for input(s): IRON, TIBC, TRANSFERRIN, FERRITIN in the last 168 hours.  Xrays/Other Studies: US RENAL  Result Date: 11/09/2020 CLINICAL DATA:  Acute kidney injury EXAM: RENAL / URINARY TRACT ULTRASOUND COMPLETE COMPARISON:  10/06/2020 FINDINGS: Right Kidney: Renal  measurements: 11.8 x 6.8 x 5.6 cm = volume: 237 mL. Moderate to severe right  hydronephrosis. Normal echotexture. No mass. Left Kidney: Renal measurements: 10.7 x 5.0 x 7.0 cm = volume: 200 mL. Moderate to severe left hydronephrosis. Normal echotexture. No mass. Bladder: Appears normal for degree of bladder distention. Other: None. IMPRESSION: Moderate to severe bilateral hydronephrosis, not significantly changed since prior study. Electronically Signed   By: Rolm Baptise M.D.   On: 11/09/2020 21:13     Assessment/Plan:  AKI/CKD stage IIIa - unclear etiology.  No history of nephrotoxic agents but did have change in her diuretic dose recently.  Elevated urine sodium likely due to furosemide.  Renal US with bilateral hydronephrosis but unchanged from 10/05/20.  Likely ATN from over diuresis.  Hold furosemide and follow UOP and Scr.  Hold off on IVF's for now and follow.  Hyponatremia - acute on chronic.  Already improved over the past 24 hours.  Ok to continue with sodium tabs and fluid restriction of 1 liter/day but hold furosemide and follow.  No evidence of volume overload on exam and no history of N/V/D.  Continue to monitor serum sodium levels. AMS - possibly related to recurrent UTI as sodium level is back to 124.   Recurrent UTI - urine looks consistent with infection and urine cultures pending.  Started on rocephin per primary svc.  Avoid quinolones due to hyponatremia. Hyperkalemia - due to #1 as well as sports drink with added K.  Cont with free water restriction of 1 liter and follow.  HTN - stable on amlodipine Tachycardia - possibly due to volume depletion vs infection.   Chronic bilateral hydronephrosis - no significant change on [place] from 10/05/20.  If SCr continues to climb would consider bilateral PC nephrostomy tubes or urology evaluation.   Governor Rooks Braeton [place]ott 11/09/2020, 10:10 PM

## 2020-11-09 NOTE — ED Triage Notes (Signed)
Pt presents after having lab work completed yesterday.  Labwork reflected Sodium 120 and Potassium 5.8.  Daughter feels she may have an UTI as well.  Hx of hyponatremia.   Pt has no complaints.

## 2020-11-09 NOTE — H&P (Signed)
History and Physical    Brenda [name] LZJ:[id number] DOB: 25-May-1930 DOA: 11/09/2020  PCP: Burnard Bunting, MD  Patient coming from: Home  I have personally briefly reviewed patient's old medical records in [place]  Chief Complaint: Hyponatremia  HPI: Brenda [name] is a 87 y.o. female with medical history significant of frequent UTIs, prior spinal tumor s/p back surgeries with chronic numbness and tingling of legs, HTN, CKD 3.  Pt admitted 5/24-5/29 to [facility] with severe acute hyponatremia: sodium was 108 at that time.  Nephrogenic component with inappropriately elevated urine sodium (SIADH vs reset osmostat).  Pt treated with 3% hypertonic saline.  Ultimately sodium improved to 124, creat at DC was 1.39, pt put on lasix + fluid restriction.  Pt then admitted to OSH 6/14-6/16.  Sodium on admission was 122, but creat on admission had jumped up to 3.0.  Details of that admit are not available.  But sodium on DC 124 and Creat had gone down to ~2.0.  Pt followed up with PCP and yesterday saw Dr. Johnney [name] (first nephrologist she was able to get an appointment with).  On labs yesterday: sodium 120, K 5.7, and Creat 2.1.  This prompted Dr. Johnney [name] to send her in to ED today.  Has some generalized weakness and confusion.  No CP, no seizure.   ED Course: Sodium 124, K 5.0, creat is 2.5.  Has what looks like a UTI.  Am admitting patient for AKI with creat of 2.5 up from 1.39 on discharge x1 month ago.   Review of Systems: As per HPI, otherwise all review of systems negative.  Past Medical History:  Diagnosis Date   Erythematous bladder mucosa    Frequent urination    Gait instability    uses walker   GERD (gastroesophageal reflux disease)    History of basal cell carcinoma excision    yrs ago-  nose   History of gout    last bout yrs ago   History of recurrent UTIs    Hydronephrosis    Hyperlipidemia    Hypertension    Nocturia    Numbness and tingling of both legs     residual  from spinal stenosis and tumor  s/p  back surgery's  uses walker   OA (osteoarthritis)    Urge urinary incontinence    Wears glasses     Past Surgical History:  Procedure Laterality Date   CARPAL TUNNEL RELEASE Right early 2000's   CATARACT EXTRACTION W/ INTRAOCULAR LENS IMPLANT Right yrs ago   CERVICAL FUSION  1999   C5 -- C6   CYSTOSCOPY W/ RETROGRADES Right 12/29/2015   Procedure: CYSTOSCOPY WITH RETROGRADE PYELOGRAM;  Surgeon: Alexis Frock, MD;  Location: [facility];  Service: Urology;  Laterality: Right;   CYSTOSCOPY WITH BIOPSY  12/29/2015   Procedure: CYSTOSCOPY WITH BIOPSY;  Surgeon: Alexis Frock, MD;  Location: [facility];  Service: Urology;;   [place] N/A 12/29/2015   Procedure: CYSTOSCOPY WITH FULGERATION;  Surgeon: Alexis Frock, MD;  Location: [facility];  Service: Urology;  Laterality: N/A;   KNEE ARTHROSCOPY Right 2010  approx   POSTERIOR LAMINECTOMY / DECOMPRESSION LUMBAR SPINE  10/28/2014   lam.  L2 -- L4/  decompression L2 -- L5/  fusion L4-5   THORACIC LAMINECTOMY  01/30/2011   lam. T9 -- T11 /  Resection Intraspinal extradural extramedullary tumor     reports that she has never smoked. She has never used smokeless tobacco.  She reports that she does not drink alcohol and does not use drugs.  No Known Allergies  Family History  Problem Relation Age of Onset   Arthritis Mother    Cancer Father      Prior to Admission medications   Medication Sig Start Date End Date Taking? Authorizing Provider  acetaminophen (TYLENOL) 500 MG tablet Take 1,000 mg by mouth every 6 (six) hours as needed for moderate pain.    Yes [provider]  allopurinol (ZYLOPRIM) 300 MG tablet Take 150 mg by mouth daily. 08/15/20  Yes [provider]  amLODipine (NORVASC) 5 MG tablet Take 5 mg by mouth daily. 09/26/20  Yes [provider]  Cyanocobalamin (VITAMIN B12) 1000 MCG TBCR  Take 1,000 mcg by mouth daily.   Yes [provider]  diclofenac Sodium (VOLTAREN) 1 % GEL APPLY 2 GRAMS TO AFFECTED AREA 4 TIMES A DAY Patient taking differently: Apply 2 g topically 2 (two) times daily as needed (pain). 01/05/20  Yes Leandrew Koyanagi, MD  Emollient (AQUAPHOR EX) Apply 1 application topically See admin instructions. 1 application each time going to bathroom   Yes [provider]  furosemide (LASIX) 20 MG tablet Take 1 tablet (20 mg total) by mouth daily for 15 days. Patient taking differently: Take 10 mg by mouth daily. 10/11/20 11/09/20 Yes Arrien, Jimmy Picket, MD  Multiple Vitamin (MULTIVITAMIN) tablet Take 1 tablet by mouth daily.   Yes [provider]  neomycin-bacitracin-polymyxin (NEOSPORIN) ointment Apply 1 application topically See admin instructions. 1 application each time going to bathroom   Yes [provider]  OVER THE COUNTER MEDICATION Take 1 tablet by mouth daily. Vitamin D3 + K 2 in coconut oil   Yes [provider]  Probiotic Product ([place]) CAPS Take 1 capsule by mouth daily.   Yes [provider]  sodium chloride 1 g tablet Take 1 g by mouth 2 (two) times daily with a meal.   Yes [provider]  triamcinolone cream (KENALOG) 0.1 % Apply 1 application topically 2 (two) times daily.   Yes [provider]  zinc gluconate 50 MG tablet Take 25 mg by mouth daily.   Yes [provider]    Physical Exam: Vitals:   11/09/20 1915 11/09/20 2015 11/09/20 2045 11/09/20 2145  BP: (!) 131/57 131/60 130/67 (!) 115/57  Pulse: 99 100 (!) 110 (!) 102  Resp: 18 18 18 18   Temp:      TempSrc:      SpO2: 95% 100% 97% 96%  Weight:      Height:        Constitutional: NAD, calm, comfortable Eyes: PERRL, lids and conjunctivae normal ENMT: Mucous membranes are moist. Posterior pharynx clear of any exudate or lesions.Normal dentition.  Neck: normal, supple, no masses, no  thyromegaly Respiratory: clear to auscultation bilaterally, no wheezing, no crackles. Normal respiratory effort. No accessory muscle use.  Cardiovascular: Regular rate and rhythm, no murmurs / rubs / gallops. No extremity edema. 2+ pedal pulses. No carotid bruits.  Abdomen: no tenderness, no masses palpated. No hepatosplenomegaly. Bowel sounds positive.  Musculoskeletal: no clubbing / cyanosis. No joint deformity upper and lower extremities. Good ROM, no contractures. Normal muscle tone.  Skin: no rashes, lesions, ulcers. No induration Neurologic: CN 2-12 grossly intact. Sensation intact, DTR normal. Strength 5/5 in all 4.  Psychiatric: Mild confusion   Labs on Admission: I have personally reviewed following labs and imaging studies  CBC: Recent Labs  Lab  11/09/20 1725  WBC 15.3*  NEUTROABS 12.1*  HGB 8.9*  HCT 27.2*  MCV 91.6  PLT 329*   Basic Metabolic Panel: Recent Labs  Lab 11/09/20 1725  NA 124*  K 5.0  CL 93*  CO2 22  GLUCOSE 174*  BUN 26*  CREATININE 2.52*  CALCIUM 8.4*   GFR: Estimated Creatinine Clearance: 12.4 mL/min (A) (by C-G formula based on SCr of 2.52 mg/dL (H)). Liver Function Tests: Recent Labs  Lab 11/09/20 1725  AST 18  ALT 15  ALKPHOS 82  BILITOT 0.6  PROT 7.4  ALBUMIN 3.1*   No results for input(s): LIPASE, AMYLASE in the last 168 hours. No results for input(s): AMMONIA in the last 168 hours. Coagulation Profile: No results for input(s): INR, PROTIME in the last 168 hours. Cardiac Enzymes: No results for input(s): CKTOTAL, CKMB, CKMBINDEX, TROPONINI in the last 168 hours. BNP (last 3 results) No results for input(s): PROBNP in the last 8760 hours. HbA1C: No results for input(s): HGBA1C in the last 72 hours. CBG: No results for input(s): GLUCAP in the last 168 hours. Lipid Profile: No results for input(s): CHOL, HDL, LDLCALC, TRIG, CHOLHDL, LDLDIRECT in the last 72 hours. Thyroid Function Tests: No results for input(s): TSH,  T4TOTAL, FREET4, T3FREE, THYROIDAB in the last 72 hours. Anemia Panel: No results for input(s): VITAMINB12, FOLATE, FERRITIN, TIBC, IRON, RETICCTPCT in the last 72 hours. Urine analysis:    Component Value Date/Time   COLORURINE YELLOW 11/09/2020 1725   APPEARANCEUR TURBID (A) 11/09/2020 1725   LABSPEC 1.008 11/09/2020 1725   PHURINE 7.0 11/09/2020 1725   GLUCOSEU NEGATIVE 11/09/2020 1725   HGBUR MODERATE (A) 11/09/2020 1725   BILIRUBINUR NEGATIVE 11/09/2020 1725   KETONESUR NEGATIVE 11/09/2020 1725   PROTEINUR 100 (A) 11/09/2020 1725   UROBILINOGEN 0.2 12/12/2014 1940   NITRITE NEGATIVE 11/09/2020 1725   LEUKOCYTESUR LARGE (A) 11/09/2020 1725    Radiological Exams on Admission: US RENAL  Result Date: 11/09/2020 CLINICAL DATA:  Acute kidney injury EXAM: RENAL / URINARY TRACT ULTRASOUND COMPLETE COMPARISON:  10/06/2020 FINDINGS: Right Kidney: Renal measurements: 11.8 x 6.8 x 5.6 cm = volume: 237 mL. Moderate to severe right hydronephrosis. Normal echotexture. No mass. Left Kidney: Renal measurements: 10.7 x 5.0 x 7.0 cm = volume: 200 mL. Moderate to severe left hydronephrosis. Normal echotexture. No mass. Bladder: Appears normal for degree of bladder distention. Other: None. IMPRESSION: Moderate to severe bilateral hydronephrosis, not significantly changed since prior study. Electronically Signed   By: Rolm Baptise M.D.   On: 11/09/2020 21:13    EKG: Independently reviewed.  Assessment/Plan Principal Problem:   AKI (acute kidney injury) ([place]) Active Problems:   Essential hypertension   Hyponatremia   Acute lower UTI   CKD (chronic kidney disease) stage 3, GFR 30-59 ml/min (HCC)    AKI on CKD 3b - Pt with creat jump from 1.39 on discharge at end of May to 3.0 on 6/14 when she was hospitalized for 3 days at OSH.  Trended down to 2.0 range before trending up slightly to 2.5 today. Concern is that pt may be over diuresed / dehydrated with ongoing fluid restriction + lasix  use Problem is that I dont want to just order normal saline on this patient given hyponatremia, strong suspicion of a nephrogenic hyponatremia: ? SIADH ? Reset osmostat and urine sodium today is 66. Doesn't appear to be urinary retention related based on [place] and bladder scan. Getting renal US to make sure that her chronic hydro is  no worse today than previously. Have asked Dr. Marval Regal to take a look at patient and make recs Will hold off on specific fluid orders for the moment as well as holding lasix until he can weigh in on the issue. Repeat BMP ordered for AM Hyponatremia - Sodium of 124 today is essentially unchanged from discharge X1 month ago.  Was 108 on admission x1 month ago. Concern for poss SIADH vs reset osmostat. Urine sodium continues to be inappropriately elevated at 66 today. Holding lasix due to AKI for the moment Not clear yet what we are going to do volume wise, Dr. Marval Regal to make recs. HTN - Cont amlodipine UTI - Rocephin UCx pending  DVT prophylaxis: Heparin [place] Code Status: DNR - confirmed with daughter Family Communication: Daughter at bedside Disposition Plan: Home after renal recovery Consults called: Dr. Marval Regal Admission status: Place in [place]   Obrian Bulson M. DO [facility]  How to contact the [facility] Attending or Consulting provider [place] or covering provider during after hours [place], for this patient?  Check the care team in [facility] and look for a) attending/consulting TR[facility] provider listed and b) the [facility] team listed Log into [email or web address]  Amion Physician Scheduling and messaging for groups and whole hospitals  On call and physician scheduling software for group practices, residents, hospitalists and other medical providers for call, clinic, rotation and shift schedules. OnCall [employer] is a hospital-wide system for scheduling doctors and paging doctors on call. EasyPlot is for scientific plotting and data analysis.  [email or web address]  and use  [facility]'s universal password to access. If you do not have the password, please contact the hospital operator.  Locate the [facility] provider you are looking for under [facility] and page to a number that you can be directly reached. If you still have difficulty reaching the provider, please page the [facility] (Director on Call) for the Hospitalists listed on amion for assistance.  11/09/2020, 10:11 PM

## 2020-11-09 NOTE — ED Provider Notes (Signed)
[place] DEPT Provider Note   CSN: [id number] Arrival date & time: 11/09/20  1700     History Chief Complaint  Patient presents with   Abnormal Lab    Brenda [name] is a 87 y.o. female.  Is back again for possible admission for recurrent hyponatremia.  She has been on fluid restriction and sodium downtrending.  Sodium now 120.  History of UTIs.  She has had some generalized weakness and some confusion.  The history is provided by the patient.  Abnormal Lab Time since result:  Sodium of 120 Patient referred by:  Specialist Illness Location:  General Severity:  Mild Timing:  Constant Chronicity:  Chronic Associated symptoms: no abdominal pain, no chest pain, no cough, no ear pain, no fever, no rash, no shortness of breath, no sore throat and no vomiting       Past Medical History:  Diagnosis Date   Erythematous bladder mucosa    Frequent urination    Gait instability    uses walker   GERD (gastroesophageal reflux disease)    History of basal cell carcinoma excision    yrs ago-  nose   History of gout    last bout yrs ago   History of recurrent UTIs    Hydronephrosis    Hyperlipidemia    Hypertension    Nocturia    Numbness and tingling of both legs    residual  from spinal stenosis and tumor  s/p  back surgery's  uses walker   OA (osteoarthritis)    Urge urinary incontinence    Wears glasses     Patient Active Problem List   Diagnosis Date Noted   CKD (chronic kidney disease) stage 3, GFR 30-59 ml/min (HCC) 11/09/2020   Acute lower UTI 10/10/2020   Anemia of chronic disease 10/10/2020   Hyponatremia 10/05/2020   Acute hyponatremia    AKI (acute kidney injury) ([place])    Hyperkalemia    Edema    Gross hematuria 12/29/2015   Sepsis secondary to UTI ([place]) 12/12/2014   Essential hypertension 12/12/2014   Spondylolisthesis of lumbar region 10/28/2014   T10 spinal cord injury ([place]) 10/10/2011   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     Past Surgical History:  Procedure Laterality Date   CARPAL TUNNEL RELEASE Right early 2000's   CATARACT EXTRACTION W/ INTRAOCULAR LENS IMPLANT Right yrs ago   CERVICAL FUSION  1999   C5 -- C6   CYSTOSCOPY W/ RETROGRADES Right 12/29/2015   Procedure: CYSTOSCOPY WITH RETROGRADE PYELOGRAM;  Surgeon: Alexis Frock, MD;  Location: [facility];  Service: Urology;  Laterality: Right;   CYSTOSCOPY WITH BIOPSY  12/29/2015   Procedure: CYSTOSCOPY WITH BIOPSY;  Surgeon: Alexis Frock, MD;  Location: [facility];  Service: Urology;;   [place] N/A 12/29/2015   Procedure: CYSTOSCOPY WITH FULGERATION;  Surgeon: Alexis Frock, MD;  [facility];  Service: Urology;  Laterality: N/A;   KNEE ARTHROSCOPY Right 2010  approx   POSTERIOR LAMINECTOMY / DECOMPRESSION LUMBAR SPINE  10/28/2014   lam.  L2 -- L4/  decompression L2 -- L5/  fusion L4-5   THORACIC LAMINECTOMY  01/30/2011   lam. T9 -- T11 /  Resection Intraspinal extradural extramedullary tumor     OB History   No obstetric history on file.     Family History  Problem Relation Age of Onset   Arthritis Mother    Cancer Father     Social History  Tobacco Use   Smoking status: Never   Smokeless tobacco: Never  Vaping Use   Vaping Use: Never used  Substance Use Topics   Alcohol use: No   Drug use: No    Home Medications Prior to Admission medications   Medication Sig Start Date End Date Taking? Authorizing Provider  acetaminophen (TYLENOL) 500 MG tablet Take 1,000 mg by mouth every 6 (six) hours as needed for moderate pain.    Yes [provider]  allopurinol (ZYLOPRIM) 300 MG tablet Take 150 mg by mouth daily. 08/15/20  Yes [provider]  amLODipine (NORVASC) 5 MG tablet Take 5 mg by mouth daily. 09/26/20  Yes [provider]  Cyanocobalamin (VITAMIN B12) 1000 MCG TBCR Take 1,000 mcg by mouth daily.   Yes [provider]   diclofenac Sodium (VOLTAREN) 1 % GEL APPLY 2 GRAMS TO AFFECTED AREA 4 TIMES A DAY Patient taking differently: Apply 2 g topically 2 (two) times daily as needed (pain). 01/05/20  Yes Leandrew Koyanagi, MD  Emollient (AQUAPHOR EX) Apply 1 application topically See admin instructions. 1 application each time going to bathroom   Yes [provider]  furosemide (LASIX) 20 MG tablet Take 1 tablet (20 mg total) by mouth daily for 15 days. Patient taking differently: Take 10 mg by mouth daily. 10/11/20 11/09/20 Yes Arrien, Jimmy Picket, MD  Multiple Vitamin (MULTIVITAMIN) tablet Take 1 tablet by mouth daily.   Yes [provider]  neomycin-bacitracin-polymyxin (NEOSPORIN) ointment Apply 1 application topically See admin instructions. 1 application each time going to bathroom   Yes [provider]  OVER THE COUNTER MEDICATION Take 1 tablet by mouth daily. Vitamin D3 + K 2 in coconut oil   Yes [provider]  Probiotic Product ([place]) CAPS Take 1 capsule by mouth daily.   Yes [provider]  sodium chloride 1 g tablet Take 1 g by mouth 2 (two) times daily with a meal.   Yes [provider]  triamcinolone cream (KENALOG) 0.1 % Apply 1 application topically 2 (two) times daily.   Yes [provider]  zinc gluconate 50 MG tablet Take 25 mg by mouth daily.   Yes [provider]    Allergies    Patient has no known allergies.  Review of Systems   Review of Systems  Constitutional:  Negative for chills and fever.  HENT:  Negative for ear pain and sore throat.   Eyes:  Negative for pain and visual disturbance.  Respiratory:  Negative for cough and shortness of breath.   Cardiovascular:  Negative for chest pain and palpitations.  Gastrointestinal:  Negative for abdominal pain and vomiting.  Genitourinary:  Negative for dysuria and hematuria.  Musculoskeletal:  Negative for arthralgias and back pain.  Skin:  Negative for  color change and rash.  Neurological:  Positive for weakness. Negative for seizures and syncope.  Psychiatric/Behavioral:  Positive for confusion.   All other systems reviewed and are negative.  Physical Exam Updated Vital Signs BP 131/60   Pulse 100   Temp 97.8 F (36.6 C) (Oral)   Resp 18   Ht 5\' 2"  (1.575 m)   Wt 58.1 kg   SpO2 100%   BMI 23.41 kg/m   Physical Exam Vitals and nursing note reviewed.  Constitutional:      General: She is not in acute distress.    Appearance: She is well-developed. She is not ill-appearing.  HENT:     Head: Normocephalic and  atraumatic.     Mouth/Throat:     Mouth: Mucous membranes are moist.  Eyes:     Extraocular Movements: Extraocular movements intact.     Conjunctiva/sclera: Conjunctivae normal.     Pupils: Pupils are equal, round, and reactive to light.  Cardiovascular:     Rate and Rhythm: Normal rate and regular rhythm.     Pulses: Normal pulses.     Heart sounds: Normal heart sounds. No murmur heard. Pulmonary:     Effort: Pulmonary effort is normal. No respiratory distress.     Breath sounds: Normal breath sounds.  Abdominal:     Palpations: Abdomen is soft.     Tenderness: There is no abdominal tenderness.  Musculoskeletal:     Cervical back: Neck supple.  Skin:    General: Skin is warm and dry.     Capillary Refill: Capillary refill takes less than 2 seconds.  Neurological:     General: No focal deficit present.     Mental Status: She is alert.     Cranial Nerves: No cranial nerve deficit.     Sensory: No sensory deficit.    ED Results / Procedures / Treatments   Labs (all labs ordered are listed, but only abnormal results are displayed) Labs Reviewed  CBC WITH DIFFERENTIAL/PLATELET - Abnormal; Notable for the following components:      Result Value   WBC 15.3 (*)    RBC 2.97 (*)    Hemoglobin 8.9 (*)    HCT 27.2 (*)    Platelets 462 (*)    Neutro Abs 12.1 (*)    Monocytes Absolute 1.6 (*)    Abs Immature  Granulocytes 0.08 (*)    All other components within normal limits  BASIC METABOLIC PANEL - Abnormal; Notable for the following components:   Sodium 124 (*)    Chloride 93 (*)    Glucose, Bld 174 (*)    BUN 26 (*)    Creatinine, Ser 2.52 (*)    Calcium 8.4 (*)    GFR, Estimated 18 (*)    All other components within normal limits  HEPATIC FUNCTION PANEL - Abnormal; Notable for the following components:   Albumin 3.1 (*)    All other components within normal limits  URINALYSIS, ROUTINE W REFLEX MICROSCOPIC - Abnormal; Notable for the following components:   APPearance TURBID (*)    Hgb urine dipstick MODERATE (*)    Protein, ur 100 (*)    Leukocytes,Ua LARGE (*)    RBC / HPF >50 (*)    WBC, UA >50 (*)    Bacteria, UA MANY (*)    All other components within normal limits  RESP PANEL BY RT-PCR (FLU A&B, COVID) ARPGX2  URINE CULTURE  NA AND K (SODIUM & POTASSIUM), RAND UR  OSMOLALITY, URINE  OSMOLALITY    EKG EKG Interpretation  Date/Time:  Tuesday November 09 2020 18:00:56 EDT Ventricular Rate:  102 PR Interval:  143 QRS Duration: 85 QT Interval:  313 QTC Calculation: 408 R Axis:   47 Text Interpretation: Sinus tachycardia Low voltage, precordial leads RSR' in V1 or V2, right VCD or RVH Confirmed by Ronnald Nian, [place] [phone]) on 11/09/2020 6:33:38 PM  Radiology No results found.  Procedures Procedures   Medications Ordered in ED Medications - No data to display  ED Course  I have reviewed the triage vital signs and the nursing notes.  Pertinent labs & imaging results that were available during my care of the patient were reviewed by me  and considered in my medical decision making (see chart for details).    MDM Rules/Calculators/A&P                          Brenda [name] is here with concern for worsening hyponatremia and possible UTI.  Patient with history of recent admission for hyponatremia and UTI.  Saw nephrology and sodium down to 120 and recent baseline has been  around 124.  She is having some generalized weakness and may be some confusion.  She has had some issues with urinary retention and has needed Foley catheter in the past.  Patient overall appears stable clinically.  Exam is unremarkable.  Lab work shows a white count of 15.  Sodium is 124.  Potassium is normal.  EKG shows no ischemic changes.  Urinalysis consistent with infection.  Bladder scan shows 150 cc of urine.  She is able to make urine through a purewick.  Overall creatinine is slightly elevated above her baseline now as well.  Suspect overflow versus neurogenic process causing urinary retention.  Now again with urine infection.  Elevated kidney function.  Sodium appears to be stabilized.  Will be admitted to medicine for further care.  Antibiotics to be given.  No fever and no concern for sepsis at this time.  This chart was dictated using voice recognition software.  Despite best efforts to proofread,  errors can occur which can change the documentation meaning.   Final Clinical Impression(s) / ED Diagnoses Final diagnoses:  AKI (acute kidney injury) ([place])  Acute cystitis without hematuria  Hyponatremia    Rx / DC Orders ED Discharge Orders     None        Lennice Sites, DO 11/09/20 2039

## 2020-11-10 ENCOUNTER — Encounter (HOSPITAL_COMMUNITY): Payer: Self-pay | Admitting: Internal Medicine

## 2020-11-10 ENCOUNTER — Other Ambulatory Visit: Payer: Self-pay

## 2020-11-10 DIAGNOSIS — E869 Volume depletion, unspecified: Secondary | ICD-10-CM | POA: Diagnosis present

## 2020-11-10 DIAGNOSIS — B965 Pseudomonas (aeruginosa) (mallei) (pseudomallei) as the cause of diseases classified elsewhere: Secondary | ICD-10-CM | POA: Diagnosis present

## 2020-11-10 DIAGNOSIS — Z20822 Contact with and (suspected) exposure to covid-19: Secondary | ICD-10-CM | POA: Diagnosis present

## 2020-11-10 DIAGNOSIS — N390 Urinary tract infection, site not specified: Secondary | ICD-10-CM | POA: Diagnosis not present

## 2020-11-10 DIAGNOSIS — Z981 Arthrodesis status: Secondary | ICD-10-CM | POA: Diagnosis not present

## 2020-11-10 DIAGNOSIS — Z9841 Cataract extraction status, right eye: Secondary | ICD-10-CM | POA: Diagnosis not present

## 2020-11-10 DIAGNOSIS — E871 Hypo-osmolality and hyponatremia: Secondary | ICD-10-CM | POA: Diagnosis present

## 2020-11-10 DIAGNOSIS — R5381 Other malaise: Secondary | ICD-10-CM | POA: Diagnosis present

## 2020-11-10 DIAGNOSIS — D631 Anemia in chronic kidney disease: Secondary | ICD-10-CM | POA: Diagnosis present

## 2020-11-10 DIAGNOSIS — I10 Essential (primary) hypertension: Secondary | ICD-10-CM | POA: Diagnosis not present

## 2020-11-10 DIAGNOSIS — L89150 Pressure ulcer of sacral region, unstageable: Secondary | ICD-10-CM | POA: Diagnosis present

## 2020-11-10 DIAGNOSIS — E222 Syndrome of inappropriate secretion of antidiuretic hormone: Secondary | ICD-10-CM | POA: Diagnosis present

## 2020-11-10 DIAGNOSIS — K219 Gastro-esophageal reflux disease without esophagitis: Secondary | ICD-10-CM | POA: Diagnosis present

## 2020-11-10 DIAGNOSIS — E785 Hyperlipidemia, unspecified: Secondary | ICD-10-CM | POA: Diagnosis present

## 2020-11-10 DIAGNOSIS — R000 Tachycardia, unspecified: Secondary | ICD-10-CM | POA: Diagnosis present

## 2020-11-10 DIAGNOSIS — G9341 Metabolic encephalopathy: Secondary | ICD-10-CM | POA: Diagnosis present

## 2020-11-10 DIAGNOSIS — T501X5A Adverse effect of loop [high-ceiling] diuretics, initial encounter: Secondary | ICD-10-CM | POA: Diagnosis present

## 2020-11-10 DIAGNOSIS — I129 Hypertensive chronic kidney disease with stage 1 through stage 4 chronic kidney disease, or unspecified chronic kidney disease: Secondary | ICD-10-CM | POA: Diagnosis present

## 2020-11-10 DIAGNOSIS — Z87440 Personal history of urinary (tract) infections: Secondary | ICD-10-CM | POA: Diagnosis not present

## 2020-11-10 DIAGNOSIS — Z66 Do not resuscitate: Secondary | ICD-10-CM | POA: Diagnosis present

## 2020-11-10 DIAGNOSIS — Z2239 Carrier of other specified bacterial diseases: Secondary | ICD-10-CM | POA: Diagnosis not present

## 2020-11-10 DIAGNOSIS — N1832 Chronic kidney disease, stage 3b: Secondary | ICD-10-CM | POA: Diagnosis present

## 2020-11-10 DIAGNOSIS — N136 Pyonephrosis: Secondary | ICD-10-CM | POA: Diagnosis present

## 2020-11-10 DIAGNOSIS — E875 Hyperkalemia: Secondary | ICD-10-CM | POA: Diagnosis present

## 2020-11-10 DIAGNOSIS — N3010 Interstitial cystitis (chronic) without hematuria: Secondary | ICD-10-CM | POA: Diagnosis present

## 2020-11-10 DIAGNOSIS — N179 Acute kidney failure, unspecified: Secondary | ICD-10-CM | POA: Diagnosis not present

## 2020-11-10 DIAGNOSIS — N1831 Chronic kidney disease, stage 3a: Secondary | ICD-10-CM | POA: Diagnosis not present

## 2020-11-10 DIAGNOSIS — N3000 Acute cystitis without hematuria: Secondary | ICD-10-CM | POA: Diagnosis not present

## 2020-11-10 DIAGNOSIS — Z85828 Personal history of other malignant neoplasm of skin: Secondary | ICD-10-CM | POA: Diagnosis not present

## 2020-11-10 DIAGNOSIS — N170 Acute kidney failure with tubular necrosis: Secondary | ICD-10-CM | POA: Diagnosis present

## 2020-11-10 LAB — BASIC METABOLIC PANEL
Anion gap: 10 (ref 5–15)
Anion gap: 6 (ref 5–15)
BUN: 29 mg/dL — ABNORMAL HIGH (ref 8–23)
BUN: 31 mg/dL — ABNORMAL HIGH (ref 8–23)
CO2: 22 mmol/L (ref 22–32)
CO2: 21 mmol/L — ABNORMAL LOW (ref 22–32)
Calcium: 8.6 mg/dL — ABNORMAL LOW (ref 8.9–10.3)
Calcium: 7.9 mg/dL — ABNORMAL LOW (ref 8.9–10.3)
Chloride: 94 mmol/L — ABNORMAL LOW (ref 98–111)
Chloride: 95 mmol/L — ABNORMAL LOW (ref 98–111)
Creatinine, Ser: 2.54 mg/dL — ABNORMAL HIGH (ref 0.44–1.00)
Creatinine, Ser: 2.42 mg/dL — ABNORMAL HIGH (ref 0.44–1.00)
GFR, Estimated: 18 mL/min — ABNORMAL LOW (ref >60)
GFR, Estimated: 19 mL/min — ABNORMAL LOW (ref >60)
Glucose, Bld: 107 mg/dL — ABNORMAL HIGH (ref 70–99)
Glucose, Bld: 121 mg/dL — ABNORMAL HIGH (ref 70–99)
Potassium: 4.7 mmol/L (ref 3.5–5.1)
Potassium: 4.7 mmol/L (ref 3.5–5.1)
Sodium: 126 mmol/L — ABNORMAL LOW (ref 135–145)
Sodium: 122 mmol/L — ABNORMAL LOW (ref 135–145)

## 2020-11-10 LAB — LIPID PANEL
Cholesterol: 85 mg/dL (ref 0–200)
HDL: 44 mg/dL (ref >40)
LDL Cholesterol: 33 mg/dL (ref 0–99)
Total CHOL/HDL Ratio: 1.9 RATIO
Triglycerides: 38 mg/dL (ref <150)
VLDL: 8 mg/dL (ref 0–40)

## 2020-11-10 LAB — OSMOLALITY
Osmolality: 270 mOsm/kg — ABNORMAL LOW (ref 275–295)
Osmolality: 272 mOsm/kg — ABNORMAL LOW (ref 275–295)

## 2020-11-10 LAB — OSMOLALITY, URINE: Osmolality, Ur: 260 mOsm/kg — ABNORMAL LOW (ref 300–900)

## 2020-11-10 MED ORDER — TRIAMCINOLONE ACETONIDE 0.1 % EX CREA
1.0000 "application " | TOPICAL_CREAM | Freq: Two times a day (BID) | CUTANEOUS | Status: DC
  Administered 2020-11-10 – 2020-11-12 (×5): 1 via TOPICAL
  Filled 2020-11-10: qty 15

## 2020-11-10 MED ORDER — AQUAPHOR EX OINT
TOPICAL_OINTMENT | CUTANEOUS | Status: DC | PRN
  Filled 2020-11-10: qty 50

## 2020-11-10 MED ORDER — BACITRACIN-NEOMYCIN-POLYMYXIN 400-5-5000 EX OINT: 1.0000 "application " | TOPICAL_OINTMENT | CUTANEOUS | Status: DC | PRN

## 2020-11-10 MED ORDER — RISAQUAD PO CAPS
1.0000 | ORAL_CAPSULE | Freq: Every day | ORAL | Status: DC
  Administered 2020-11-10 – 2020-11-12 (×3): 1 via ORAL
  Filled 2020-11-10 (×3): qty 1

## 2020-11-10 MED ORDER — SODIUM CHLORIDE 1 G PO TABS
1.0000 g | ORAL_TABLET | Freq: Two times a day (BID) | ORAL | Status: DC
  Administered 2020-11-10: 1 g via ORAL
  Filled 2020-11-10 (×2): qty 1

## 2020-11-10 MED ORDER — SODIUM CHLORIDE 0.9 % IV SOLN: INTRAVENOUS | Status: DC

## 2020-11-10 MED ORDER — ADULT MULTIVITAMIN W/MINERALS CH
1.0000 | ORAL_TABLET | Freq: Every day | ORAL | Status: DC
  Administered 2020-11-10 – 2020-11-12 (×3): 1 via ORAL
  Filled 2020-11-10 (×3): qty 1

## 2020-11-10 MED ORDER — ZINC SULFATE 220 (50 ZN) MG PO CAPS
220.0000 mg | ORAL_CAPSULE | Freq: Every day | ORAL | Status: DC
  Administered 2020-11-10 – 2020-11-12 (×3): 220 mg via ORAL
  Filled 2020-11-10 (×3): qty 1

## 2020-11-10 MED ORDER — ZINC GLUCONATE 50 MG PO TABS: 25.0000 mg | ORAL_TABLET | Freq: Every day | ORAL | Status: DC

## 2020-11-10 MED ORDER — QUETIAPINE FUMARATE 25 MG PO TABS
25.0000 mg | ORAL_TABLET | Freq: Every evening | ORAL | Status: DC | PRN
  Filled 2020-11-10: qty 1

## 2020-11-10 MED ORDER — MELATONIN 3 MG PO TABS
3.0000 mg | ORAL_TABLET | Freq: Every day | ORAL | Status: DC
  Administered 2020-11-10 – 2020-11-11 (×2): 3 mg via ORAL
  Filled 2020-11-10 (×2): qty 1

## 2020-11-10 NOTE — Progress Notes (Signed)
[facility] Progress Note  Subjective: no /co, spoke w/ dtr at bedside and answered questions. Pt is w/o c/o.   Vitals:   11/10/20 0445 11/10/20 0531 11/10/20 0539 11/10/20 0919  BP: (!) 125/54 (!) 125/50  (!) 121/59  Pulse: 96 96  94  Resp:  20  18  Temp:  98.8 F (37.1 C)  98.5 F (36.9 C)  TempSrc:  Oral  Oral  SpO2: 97% 95%  98%  Weight:   59.8 kg   Height:   5\' 2"  (1.575 m)     Exam:  alert, nad   no jvd  Chest cta bilat  Cor reg no RG  Abd soft ntnd no ascites   Ext no LE edema   Alert, NF, ox3    UA 6/28  100 prot, turbid, many bact, >50 rbc/ wbc   Ucreat 51, UNa 66    Uosm 260     Assessment/ Plan:  AKI/CKD stage IIIa - unclear etiology.  No history of nephrotoxic agents but did have change in her diuretic dose recently.  Elevated urine sodium likely due to furosemide.  Renal US with bilateral hydronephrosis but unchanged from 10/05/20.  Likely ATN from over diuresis.  Holding furosemide and follow UOP. Adding NS 0.9% IVF"s today. F/u creat in am.   Hyponatremia - acute on chronic.  Already improved over the past 24 hours. Will give NS 0.9% for today. Cont fluid restriction for now. DC salt tabs.  AMS - possibly related to recurrent UTI and low Na levels. Seems to be improving.   Recurrent UTI - urine looks consistent with infection and urine cultures pending.  Started on rocephin per primary svc.  Avoid quinolones due to hyponatremia. HTN - stable on amlodipine Tachycardia - possibly due to volume depletion vs infection.   Chronic bilateral hydronephrosis - no significant change on [place] from 10/05/20.  If SCr continues to climb would consider bilateral PC nephrostomy tubes or urology evaluation.       Rob Raye Slyter 11/10/2020, 1:14 PM   Recent Labs  Lab 11/09/20 1725 11/10/20 0607  K 5.0 4.7  BUN 26* 29*  CREATININE 2.52* 2.54*  CALCIUM 8.4* 8.6*  HGB 8.9*  --    Inpatient medications:  acidophilus  1 capsule Oral Daily   amLODipine  5  mg Oral Daily   heparin  5,000 Units Subcutaneous Q8H   melatonin  3 mg Oral QHS   multivitamin with minerals  1 tablet Oral Daily   sodium chloride  1 g Oral BID WC   triamcinolone cream  1 application Topical BID   zinc sulfate  220 mg Oral Daily    cefTRIAXone (ROCEPHIN)  IV Stopped (11/10/20 0210)   acetaminophen **OR** acetaminophen, mineral oil-hydrophilic petrolatum, neomycin-bacitracin-polymyxin, ondansetron **OR** ondansetron (ZOFRAN) IV, QUEtiapine

## 2020-11-10 NOTE — Progress Notes (Signed)
[facility] PROGRESS NOTE    Progress Note  Brenda [name]  ION:[id number] DOB: 1931/04/11 DOA: 11/09/2020 PCP: Burnard Bunting, MD     Brief Narrative:   Brenda [name] is an 87 y.o. female past medical history significant for frequent UTIs, prior spinal tumor surgery, left with residual numbness, chronic kidney disease stage III discharged from [facility] long hospital 10/10/2020 for severe acute hyponatremia, during that time there was a concern about nephrogenic component was treated with 3% saline.  Recently discharged on 10/28/2021 that time was started as wanting to acute kidney injury creatinine did improve with IV fluid hydration 12/03/2019 saw her PCP found her sodium to be 120  Assessment/Plan:   AKI (acute kidney injury) ([place]) on chronic kidney disease stage IIIb: Baseline Cr of around 1.3. Of unclear etiology, he has been on diuretic, has elevated urinary sodium likely due to Lasix. Nephrology was consulted and is concerned about ATN in the setting of overdiuresis. Lasix was held, urine output and recommended to hold IV fluids for now. She is at high risk of acute confusional state and aspiration. Start oral Haldol at night Seroquel as needed.  Acute on chronic hyponatremia: Improvement in the last 24 hours okay to continue sodium tablets per nephrology. Appears euvolemic. B-met in am.  Essential hypertension: Continue amlodipine.  Possible urinary tract infection: Urine culture has been sent he was started empirically on Rocephin has remained afebrile.  Sacral decub ulcer present on admission: RN Pressure Injury Documentation: Pressure Injury 10/05/20 Buttocks Right;Left;Medial Unstageable - Full thickness tissue loss in which the base of the injury is covered by slough (yellow, tan, gray, green or brown) and/or eschar (tan, brown or black) in the wound bed. (Active)  10/05/20 2000  Location: Buttocks  Location Orientation: Right;Left;Medial  Staging:  Unstageable - Full thickness tissue loss in which the base of the injury is covered by slough (yellow, tan, gray, green or brown) and/or eschar (tan, brown or black) in the wound bed.  Wound Description (Comments):   Present on Admission: Yes    DVT prophylaxis: lovenox Family Communication:daughter Status is: Observation  The patient will require care spanning > 2 midnights and should be moved to inpatient because: Hemodynamically unstable  Dispo: The patient is from: Home              Anticipated d/c is to: Home              Patient currently is not medically stable to d/c.   Difficult to place patient No        Code Status:     Code Status Orders  (From admission, onward)           Start     Ordered   11/09/20 2211  Do not attempt resuscitation (DNR)  Continuous       Question Answer Comment  In the event of cardiac or respiratory ARREST Do not call a "code blue"   In the event of cardiac or respiratory ARREST Do not perform Intubation, CPR, defibrillation or ACLS   In the event of cardiac or respiratory ARREST Use medication by any route, position, wound care, and other measures to relive pain and suffering. May use oxygen, suction and manual treatment of airway obstruction as needed for comfort.      11/09/20 2210           Code Status History     Date Active Date Inactive Code Status Order ID Comments User Context  11/09/2020 2054 11/09/2020 2210 DNR [id number]  Etta Quill, DO ED   10/06/2020 1338 10/10/2020 2139 Full Code [id number]  Corey Harold, NP Inpatient   10/06/2020 1211 10/06/2020 1338 DNR [id number]  Spero Geralds, MD Inpatient   10/05/2020 1830 10/06/2020 1210 Full Code [id number]  Mick Sell, PA-C ED   12/29/2015 1105 12/30/2015 1932 Full Code [id number]  Alexis Frock, MD Inpatient   12/12/2014 2145 12/14/2014 1809 Full Code [id number]  Etta Quill, DO ED   10/28/2014 1603 11/02/2014 2252 Full Code [id number]  Newman Pies, MD Inpatient       Advance Directive Documentation    Flowsheet Row Most Recent Value  Type of Advance Directive Healthcare Power of Attorney, Living will  Pre-existing out of facility DNR order (yellow form or pink MOST form) --  "MOST" Form in Place? --         IV Access:   Peripheral IV   Procedures and diagnostic studies:   US RENAL  Result Date: 11/09/2020 CLINICAL DATA:  Acute kidney injury EXAM: RENAL / URINARY TRACT ULTRASOUND COMPLETE COMPARISON:  10/06/2020 FINDINGS: Right Kidney: Renal measurements: 11.8 x 6.8 x 5.6 cm = volume: 237 mL. Moderate to severe right hydronephrosis. Normal echotexture. No mass. Left Kidney: Renal measurements: 10.7 x 5.0 x 7.0 cm = volume: 200 mL. Moderate to severe left hydronephrosis. Normal echotexture. No mass. Bladder: Appears normal for degree of bladder distention. Other: None. IMPRESSION: Moderate to severe bilateral hydronephrosis, not significantly changed since prior study. Electronically Signed   By: Rolm Baptise M.D.   On: 11/09/2020 21:13     Medical Consultants:   None.   Subjective:    Brenda [name] no complains  Objective:    Vitals:   11/10/20 0445 11/10/20 0531 11/10/20 0539 11/10/20 0919  BP: (!) 125/54 (!) 125/50  (!) 121/59  Pulse: 96 96  94  Resp:  20  18  Temp:  98.8 F (37.1 C)  98.5 F (36.9 C)  TempSrc:  Oral  Oral  SpO2: 97% 95%  98%  Weight:   59.8 kg   Height:   5' 2"  (1.575 m)    SpO2: 98 %   Intake/Output Summary (Last 24 hours) at 11/10/2020 0955 Last data filed at 11/09/2020 2137 Gross per 24 hour  Intake --  Output 350 ml  Net -350 ml   Filed Weights   11/09/20 1716 11/10/20 0539  Weight: 58.1 kg 59.8 kg    Exam: General exam: In no acute distress. Respiratory system: Good air movement and clear to auscultation. Cardiovascular system: S1 & S2 heard, RRR. No JVD. Gastrointestinal system: Abdomen is nondistended, soft and nontender.  Extremities: No pedal edema. Skin: No rashes,  lesions or ulcers Psychiatry: Judgement and insight appear normal. Mood & affect appropriate.    Data Reviewed:    Labs: Basic Metabolic Panel: Recent Labs  Lab 11/09/20 1725 11/10/20 0607  NA 124* 126*  K 5.0 4.7  CL 93* 94*  CO2 22 22  GLUCOSE 174* 107*  BUN 26* 29*  CREATININE 2.52* 2.54*  CALCIUM 8.4* 8.6*   GFR Estimated Creatinine Clearance: 12.3 mL/min (A) (by C-G formula based on SCr of 2.54 mg/dL (H)). Liver Function Tests: Recent Labs  Lab 11/09/20 1725  AST 18  ALT 15  ALKPHOS 82  BILITOT 0.6  PROT 7.4  ALBUMIN 3.1*   No results for input(s): LIPASE, AMYLASE in the last 168 hours. No results for input(s): AMMONIA in the last  168 hours. Coagulation profile No results for input(s): INR, PROTIME in the last 168 hours. COVID-19 Labs  No results for input(s): DDIMER, FERRITIN, LDH, CRP in the last 72 hours.  Lab Results  Component Value Date   SARSCOV2NAA NEGATIVE 11/09/2020   [place] NEGATIVE 10/05/2020    CBC: Recent Labs  Lab 11/09/20 1725  WBC 15.3*  NEUTROABS 12.1*  HGB 8.9*  HCT 27.2*  MCV 91.6  PLT 462*   Cardiac Enzymes: No results for input(s): CKTOTAL, CKMB, CKMBINDEX, TROPONINI in the last 168 hours. BNP (last 3 results) No results for input(s): PROBNP in the last 8760 hours. CBG: No results for input(s): GLUCAP in the last 168 hours. D-Dimer: No results for input(s): DDIMER in the last 72 hours. Hgb A1c: No results for input(s): HGBA1C in the last 72 hours. Lipid Profile: Recent Labs    11/10/20 0607  CHOL 85  HDL 44  LDLCALC 33  TRIG 38  CHOLHDL 1.9   Thyroid function studies: No results for input(s): TSH, T4TOTAL, T3FREE, THYROIDAB in the last 72 hours.  Invalid input(s): FREET3 Anemia work up: No results for input(s): VITAMINB12, FOLATE, FERRITIN, TIBC, IRON, RETICCTPCT in the last 72 hours. Sepsis Labs: Recent Labs  Lab 11/09/20 1725  WBC 15.3*   Microbiology Recent Results (from the past 240  hour(s))  Resp Panel by RT-PCR (Flu A&B, Covid) Nasopharyngeal Swab     Status: None   Collection Time: 11/09/20  5:39 PM   Specimen: Nasopharyngeal Swab; Nasopharyngeal(NP) swabs in vial transport medium  Result Value Ref Range Status   SARS Coronavirus 2 by RT PCR NEGATIVE NEGATIVE Final    Comment: (NOTE) SARS-CoV-2 target nucleic acids are NOT DETECTED.  The SARS-CoV-2 RNA is generally detectable in upper respiratory specimens during the acute phase of infection. The lowest concentration of SARS-CoV-2 viral copies this assay can detect is 138 copies/mL. A negative result does not preclude SARS-Cov-2 infection and should not be used as the sole basis for treatment or other patient management decisions. A negative result may occur with  improper specimen collection/handling, submission of specimen other than nasopharyngeal swab, presence of viral mutation(s) within the areas targeted by this assay, and inadequate number of viral copies(<138 copies/mL). A negative result must be combined with clinical observations, patient history, and epidemiological information. The expected result is Negative.  Fact Sheet for Patients:  EntrepreneurPulse.com.au  Fact Sheet for Healthcare Providers:  IncredibleEmployment.be  This test is no t yet approved or cleared by the [place] FDA and  has been authorized for detection and/or diagnosis of SARS-CoV-2 by FDA under an Emergency Use Authorization (EUA). This EUA will remain  in effect (meaning this test can be used) for the duration of the COVID-19 declaration under Section 564(b)(1) of the Act, 21 U.S.C.section 360bbb-3(b)(1), unless the authorization is terminated  or revoked sooner.       Influenza A by PCR NEGATIVE NEGATIVE Final   Influenza B by PCR NEGATIVE NEGATIVE Final    Comment: (NOTE) The Xpert Xpress SARS-CoV-2/FLU/RSV plus assay is intended as an aid in the diagnosis of influenza from  Nasopharyngeal swab specimens and should not be used as a sole basis for treatment. Nasal washings and aspirates are unacceptable for Xpert Xpress SARS-CoV-2/FLU/RSV testing.  Fact Sheet for Patients: EntrepreneurPulse.com.au  Fact Sheet for Healthcare Providers: IncredibleEmployment.be  This test is not yet approved or cleared by the [place] FDA and has been authorized for detection and/or diagnosis of SARS-CoV-2 by FDA under an Emergency Use  Authorization (EUA). This EUA will remain in effect (meaning this test can be used) for the duration of the COVID-19 declaration under Section 564(b)(1) of the Act, 21 U.S.C. section 360bbb-3(b)(1), unless the authorization is terminated or revoked.  Performed at [facility], [place] [address], [place], [place] [address]      Medications:    acidophilus  1 capsule Oral Daily   amLODipine  5 mg Oral Daily   heparin  5,000 Units Subcutaneous Q8H   multivitamin with minerals  1 tablet Oral Daily   sodium chloride  1 g Oral BID WC   triamcinolone cream  1 application Topical BID   zinc sulfate  220 mg Oral Daily   Continuous Infusions:  cefTRIAXone (ROCEPHIN)  IV Stopped (11/10/20 0210)      LOS: 0 days   Charlynne Cousins  [facility]  11/10/2020, 9:55 AM

## 2020-11-10 NOTE — Progress Notes (Signed)
Patient and children are requesting to speak to dr as patient has a DNR band on her wrist, however, patient is requesting to be resuscitated if her heart would stop. The only intervention patient would not want is intubation. Reached out to Dr. Aileen Fass and made him aware that patient and her children would like to talk to him regarding this.

## 2020-11-10 NOTE — Evaluation (Signed)
Physical Therapy Evaluation Patient Details Name: Brenda [name] MRN: [id number] DOB: March 22, 1931 Today's Date: 11/10/2020   History of Present Illness  87 y.o. female admitted 11/09/20 with AKI, UTI, CKD, hyponatremia, AMS. PMH of spinal tumor s/p back surgeries with chronic numbness/tingling in LEs, gout, HTN, OA. Pt has had several recent hospitalizations including 10/05/20-10/10/20 for hyponatremia at [facility], and 6/14-6/16/22 at another facility.  Clinical Impression  Pt admitted with above diagnosis. Mod assist for bed mobility and to transfer from bed to recliner. Activity tolerance limited by fatigue. Pt's daughter plans to continue providing 24* care at home upon acute DC.  Pt currently with functional limitations due to the deficits listed below (see PT Problem List). Pt will benefit from skilled PT to increase their independence and safety with mobility to allow discharge to the venue listed below.       Follow Up Recommendations Home health PT    Equipment Recommendations  Wheelchair cushion (measurements PT);Wheelchair (measurements PT);3in1 (PT)    Recommendations for Other Services       Precautions / Restrictions Precautions Precautions: Fall Precaution Comments: several falls in past 6 months Restrictions Weight Bearing Restrictions: No      Mobility  Bed Mobility Overal bed mobility: Needs Assistance Bed Mobility: Supine to Sit     Supine to sit: Mod assist     General bed mobility comments: assist to raise trunk and pivot hips to EOB    Transfers Overall transfer level: Needs assistance Equipment used: Rolling walker (2 wheeled) Transfers: Sit to/from [facility] Sit to Stand: Mod assist Stand pivot transfers: Min assist       General transfer comment: assist to power up, min A for balance, increased time  Ambulation/Gait                Stairs            Wheelchair Mobility    Modified Rankin (Stroke Patients  Only)       Balance Overall balance assessment: Needs assistance Sitting-balance support: Feet supported;No upper extremity supported Sitting balance-Leahy Scale: Poor Sitting balance - Comments: posterior lean without BUE support   Standing balance support: Bilateral upper extremity supported Standing balance-Leahy Scale: Poor Standing balance comment: relies on BUE support                             Pertinent Vitals/Pain Pain Assessment: No/denies pain    Home Living Family/patient expects to be discharged to:: Private residence Living Arrangements: Children;Other relatives (daughter and son in law) Available Help at Discharge: Family;Available 24 hours/day Type of Home: House Home Access: Stairs to enter;Ramped entrance Entrance Stairs-Rails: Right Entrance Stairs-Number of Steps: 5 Home Layout: Able to live on main level with bedroom/bathroom Home Equipment: Walker - 4 wheels;Shower seat      Prior Function Level of Independence: Needs assistance   Gait / Transfers Assistance Needed: walks with walker, was receiving HHPT/OT prior to admission  ADL's / Homemaking Assistance Needed: daughter assists with bathing/dressing        Hand Dominance        Extremity/Trunk Assessment   Upper Extremity Assessment Upper Extremity Assessment: Defer to OT evaluation    Lower Extremity Assessment Lower Extremity Assessment: Generalized weakness (B knee extension +3/5)    Cervical / Trunk Assessment Cervical / Trunk Assessment: Kyphotic  Communication   Communication: HOH  Cognition Arousal/Alertness: Awake/alert Behavior During Therapy: WFL for tasks assessed/performed Overall Cognitive  Status: Impaired/Different from baseline Area of Impairment: Memory                               General Comments: mild confusion      General Comments      Exercises     Assessment/Plan    PT Assessment Patient needs continued PT services  PT  Problem List Decreased strength;Decreased mobility;Decreased activity tolerance;Decreased balance       PT Treatment Interventions Gait training;Therapeutic exercise;Therapeutic activities;Patient/family education;Functional mobility training;Balance training    PT Goals (Current goals can be found in the Care Plan section)  Acute Rehab PT Goals Patient Stated Goal: to get strong enough to go to her grandson's band concert this weekend PT Goal Formulation: With patient/family Time For Goal Achievement: 11/24/20 Potential to Achieve Goals: Fair    Frequency Min 3X/week   Barriers to discharge        Co-evaluation               AM-PAC PT "6 Clicks" Mobility  Outcome Measure Help needed turning from your back to your side while in a flat bed without using bedrails?: A Little Help needed moving from lying on your back to sitting on the side of a flat bed without using bedrails?: A Lot Help needed moving to and from a bed to a chair (including a wheelchair)?: A Lot Help needed standing up from a chair using your arms (e.g., wheelchair or bedside chair)?: A Lot Help needed to walk in hospital room?: A Lot Help needed climbing 3-5 steps with a railing? : Total 6 Click Score: 12    End of Session Equipment Utilized During Treatment: Gait belt Activity Tolerance: Patient limited by fatigue;Patient tolerated treatment well Patient left: in chair;with call bell/phone within reach;with chair alarm set;with family/visitor present Nurse Communication: Mobility status PT Visit Diagnosis: Difficulty in walking, not elsewhere classified (R26.2);Muscle weakness (generalized) (M62.81)    Time: 5366-4403 PT Time Calculation (min) (ACUTE ONLY): 21 min   Charges:   PT Evaluation $PT Eval Moderate Complexity: 1 Mod         Philomena Doheny PT 11/10/2020  Acute Rehabilitation Services Pager [phone] Office [phone]

## 2020-11-11 LAB — BASIC METABOLIC PANEL
Anion gap: 10 (ref 5–15)
Anion gap: 9 (ref 5–15)
BUN: 28 mg/dL — ABNORMAL HIGH (ref 8–23)
BUN: 27 mg/dL — ABNORMAL HIGH (ref 8–23)
CO2: 19 mmol/L — ABNORMAL LOW (ref 22–32)
CO2: 19 mmol/L — ABNORMAL LOW (ref 22–32)
Calcium: 7.9 mg/dL — ABNORMAL LOW (ref 8.9–10.3)
Calcium: 8.1 mg/dL — ABNORMAL LOW (ref 8.9–10.3)
Chloride: 95 mmol/L — ABNORMAL LOW (ref 98–111)
Chloride: 96 mmol/L — ABNORMAL LOW (ref 98–111)
Creatinine, Ser: 2.38 mg/dL — ABNORMAL HIGH (ref 0.44–1.00)
Creatinine, Ser: 2.5 mg/dL — ABNORMAL HIGH (ref 0.44–1.00)
GFR, Estimated: 19 mL/min — ABNORMAL LOW (ref >60)
GFR, Estimated: 18 mL/min — ABNORMAL LOW (ref >60)
Glucose, Bld: 107 mg/dL — ABNORMAL HIGH (ref 70–99)
Glucose, Bld: 187 mg/dL — ABNORMAL HIGH (ref 70–99)
Potassium: 4.3 mmol/L (ref 3.5–5.1)
Potassium: 4.4 mmol/L (ref 3.5–5.1)
Sodium: 124 mmol/L — ABNORMAL LOW (ref 135–145)
Sodium: 124 mmol/L — ABNORMAL LOW (ref 135–145)

## 2020-11-11 LAB — HEPATIC FUNCTION PANEL
ALT: 26 U/L (ref 0–44)
AST: 35 U/L (ref 15–41)
Albumin: 2.4 g/dL — ABNORMAL LOW (ref 3.5–5.0)
Alkaline Phosphatase: 70 U/L (ref 38–126)
Bilirubin, Direct: 0.1 mg/dL (ref 0.0–0.2)
Indirect Bilirubin: 0.2 mg/dL — ABNORMAL LOW (ref 0.3–0.9)
Total Bilirubin: 0.3 mg/dL (ref 0.3–1.2)
Total Protein: 6.7 g/dL (ref 6.5–8.1)

## 2020-11-11 LAB — CBC WITH DIFFERENTIAL/PLATELET
Abs Immature Granulocytes: 0.03 10*3/uL (ref 0.00–0.07)
Basophils Absolute: 0 10*3/uL (ref 0.0–0.1)
Basophils Relative: 0 %
Eosinophils Absolute: 0 10*3/uL (ref 0.0–0.5)
Eosinophils Relative: 0 %
HCT: 23.3 % — ABNORMAL LOW (ref 36.0–46.0)
Hemoglobin: 7.7 g/dL — ABNORMAL LOW (ref 12.0–15.0)
Immature Granulocytes: 0 %
Lymphocytes Relative: 21 %
Lymphs Abs: 1.9 10*3/uL (ref 0.7–4.0)
MCH: 30.7 pg (ref 26.0–34.0)
MCHC: 33 g/dL (ref 30.0–36.0)
MCV: 92.8 fL (ref 80.0–100.0)
Monocytes Absolute: 1.6 10*3/uL — ABNORMAL HIGH (ref 0.1–1.0)
Monocytes Relative: 18 %
Neutro Abs: 5.4 10*3/uL (ref 1.7–7.7)
Neutrophils Relative %: 61 %
Platelets: 361 10*3/uL (ref 150–400)
RBC: 2.51 MIL/uL — ABNORMAL LOW (ref 3.87–5.11)
RDW: 15.7 % — ABNORMAL HIGH (ref 11.5–15.5)
WBC: 9 10*3/uL (ref 4.0–10.5)
nRBC: 0 % (ref 0.0–0.2)

## 2020-11-11 LAB — SODIUM: Sodium: 125 mmol/L — ABNORMAL LOW (ref 135–145)

## 2020-11-11 MED ORDER — TOLVAPTAN 15 MG PO TABS
15.0000 mg | ORAL_TABLET | Freq: Once | ORAL | Status: AC
Start: 2020-11-11 — End: 2020-11-11
  Administered 2020-11-11: 15 mg via ORAL
  Filled 2020-11-11: qty 1

## 2020-11-11 MED ORDER — TOLVAPTAN 15 MG PO TABS: 15.0000 mg | ORAL_TABLET | Freq: Once | ORAL | Status: DC

## 2020-11-11 NOTE — Progress Notes (Signed)
[place] for Tolvaptan monitoring   No Known Allergies  Patient Measurements: Height: 5\' 2"  (157.5 cm) Weight: 59.8 kg (131 lb 13.4 oz) IBW/kg (Calculated) : 50.1 TBW 59.8 kg  Vital Signs: Temp: 98.7 F (37.1 C) (06/30 0506) BP: 131/67 (06/30 0506) Pulse Rate: 92 (06/30 0506) Intake/Output from previous day: 06/29 0701 - 06/30 0700 In: 328.4 [I.V.:328.4] Out: 500 [Urine:500] Intake/Output from this shift: Total I/O In: 360 [P.O.:360] Out: -   Labs: Recent Labs    11/09/20 1725 11/09/20 2229 11/10/20 0607 11/10/20 2031 11/11/20 0504  WBC 15.3*  --   --   --  9.0  HGB 8.9*  --   --   --  7.7*  HCT 27.2*  --   --   --  23.3*  PLT 462*  --   --   --  361  CREATININE 2.52*  --  2.54* 2.42* 2.38*  LABCREA  --  51.73  --   --   --   ALBUMIN 3.1*  --   --   --   --   PROT 7.4  --   --   --   --   AST 18  --   --   --   --   ALT 15  --   --   --   --   ALKPHOS 82  --   --   --   --   BILITOT 0.6  --   --   --   --   BILIDIR 0.1  --   --   --   --   IBILI 0.5  --   --   --   --    Estimated Creatinine Clearance: 13.2 mL/min (A) (by C-G formula based on SCr of 2.38 mg/dL (H)).   Medical History: Past Medical History:  Diagnosis Date   Erythematous bladder mucosa    Frequent urination    Gait instability    uses walker   GERD (gastroesophageal reflux disease)    History of basal cell carcinoma excision    yrs ago-  nose   History of gout    last bout yrs ago   History of recurrent UTIs    Hydronephrosis    Hyperlipidemia    Hypertension    Nocturia    Numbness and tingling of both legs    residual  from spinal stenosis and tumor  s/p  back surgery's  uses walker   OA (osteoarthritis)    Urge urinary incontinence    Wears glasses     Medications:  Scheduled:   acidophilus  1 capsule Oral Daily   amLODipine  5 mg Oral Daily   heparin  5,000 Units Subcutaneous Q8H   melatonin  3 mg Oral QHS   multivitamin  with minerals  1 tablet Oral Daily   triamcinolone cream  1 application Topical BID   zinc sulfate  220 mg Oral Daily    Assessment:  6/30: Na 124   Goal of Therapy:  Baseline BMET; serum sodium q8h for the first 24 hours after dose administration, then daily. Desired rate of correction: should not exceed 8 mEq/L per 8 hours or 12 mEq/L in 24 hours. Discontinue if serum sodium anytime during the first 24 hours rises >12 mEq/L from baseline.  Plan: Baseline BMET; serum sodium q8h for the first 24 hours after dose administration, then daily.    Minda Ditto PharmD 11/11/2020,10:29 AM

## 2020-11-11 NOTE — Progress Notes (Signed)
[facility] Progress Note  Subjective: no /co, spoke w/ dtr at bedside and answered questions. Pt is w/o c/o.   Vitals:   11/10/20 1331 11/10/20 1717 11/10/20 2205 11/11/20 0506  BP: 126/61 123/62 (!) 144/71 131/67  Pulse: 94 88 100 92  Resp: 16 16 20 19   Temp: 99.5 F (37.5 C) 99.9 F (37.7 C) 98.8 F (37.1 C) 98.7 F (37.1 C)  TempSrc: Oral Oral    SpO2: 98% 98% 98% 97%  Weight:      Height:        Exam:  alert, nad   no jvd  Chest cta bilat  Cor reg no RG  Abd soft ntnd no ascites   Ext no LE edema   Alert, NF, ox3    UA 6/28  100 prot, turbid, many bact, >50 rbc/ wbc   Ucreat 51, UNa 66    Uosm 260     Assessment/ Plan:  AKI/CKD stage IIIa - unclear etiology, hx chronic bilat hydro, no change in imaging here. Baseline creat 1.2- 1.5 from 09/2020.  No history of nephrotoxic agents. Was taking po lasix at home (for low Na Rx) and suspected vol depletion related AKI. Creat down slightly, looks better on IVF's, will cont IVF"s.  Hyponatremia - acute on chronic, hx of several prior admits per family at outside facilities and here for sometimes severe hyponatremia. Per family her lower Na has been 108 at OSH. Here Uosm 260 and UNa up at 66.  Na going up and down in the mid to low 120's. Will treat as SIADH, give tolvaptan x 1 today. Then tomorrow will start 1000 cc fluid restriction and probably salt tabs.  AMS - possibly related to recurrent UTI and low Na levels. Improving.  Recurrent UTI - urine looks consistent with infection and urine cultures pending.  Started on rocephin per primary svc.   HTN - stable on amlodipine   Chronic bilateral hydronephrosis - no significant change on [place] from 10/05/20.  If SCr continues to climb would consider bilateral PC nephrostomy tubes or urology evaluation.       Brenda [name] 11/11/2020, 1:15 PM   Recent Labs  Lab 11/09/20 1725 11/10/20 0607 11/11/20 0504 11/11/20 1048  K 5.0   < > 4.3 4.4  BUN 26*   < > 28*  27*  CREATININE 2.52*   < > 2.38* 2.50*  CALCIUM 8.4*   < > 7.9* 8.1*  HGB 8.9*  --  7.7*  --    < > = values in this interval not displayed.    Inpatient medications:  acidophilus  1 capsule Oral Daily   amLODipine  5 mg Oral Daily   heparin  5,000 Units Subcutaneous Q8H   melatonin  3 mg Oral QHS   multivitamin with minerals  1 tablet Oral Daily   triamcinolone cream  1 application Topical BID   zinc sulfate  220 mg Oral Daily    sodium chloride 65 mL/hr at 11/11/20 1024   acetaminophen **OR** acetaminophen, mineral oil-hydrophilic petrolatum, neomycin-bacitracin-polymyxin, ondansetron **OR** ondansetron (ZOFRAN) IV, QUEtiapine

## 2020-11-11 NOTE — Plan of Care (Signed)

## 2020-11-11 NOTE — Progress Notes (Signed)
[facility] PROGRESS NOTE    Progress Note  Brenda [name]  ZOX:[id number] DOB: [birth year] DOA: 11/09/2020 PCP: Burnard Bunting, MD     Brief Narrative:   Brenda [name] is an 87 y.o. female past medical history significant for frequent UTIs, prior spinal tumor surgery, left with residual numbness, chronic kidney disease stage III discharged from [facility] hospital 10/10/2020 for severe acute hyponatremia, during that time there was a concern about nephrogenic component was treated with 3% saline.  Recently discharged on 10/28/2021 that time was started as wanting to acute kidney injury creatinine did improve with IV fluid hydration 12/03/2019 saw her PCP found her sodium to be 120  Assessment/Plan:   AKI (acute kidney injury) ([place]) on chronic kidney disease stage IIIb: Baseline Cr of around 1.3. Likely prerenal azotemia in the setting of diuretic use. Appreciate nephrology assistance further management per nephrologist. Renal recommended to start tolvaptan. Urine culture grew Pseudomonas discontinue IV antibiotics the patient is improving unlikely to be a urinary tract fraction. She is tolerating her diet up in bed and in a good mood.  Acute on chronic hyponatremia: Improvement in the last 24 hours okay to continue sodium tablets per nephrology. Appears euvolemic. B-met in am.  Essential hypertension: Continue amlodipine.  Pneumonia aeruginosa colonization: urine culture grew Pseudomonas aeruginosa, she started empirically with Rocephin but she is improving clinically. Will discontinue antibiotics as she be seems to be colonized with Pseudomonas.  Ethics end-of-life: We will go ahead and consult palliative care to discuss end-of-life. She is a limited code, the family realizes she coming in the end of her life cycle, the patient is getting tired coming back to the hospital.   Sacral decub ulcer present on admission:    DVT prophylaxis: lovenox Family  Communication:daughter Status is: Observation  The patient will require care spanning > 2 midnights and should be moved to inpatient because: Hemodynamically unstable  Dispo: The patient is from: Home              Anticipated d/c is to: Home              Patient currently is not medically stable to d/c.   Difficult to place patient No        Code Status:     Code Status Orders  (From admission, onward)           Start     Ordered   11/09/20 2211  Do not attempt resuscitation (DNR)  Continuous       Question Answer Comment  In the event of cardiac or respiratory ARREST Do not call a "code blue"   In the event of cardiac or respiratory ARREST Do not perform Intubation, CPR, defibrillation or ACLS   In the event of cardiac or respiratory ARREST Use medication by any route, position, wound care, and other measures to relive pain and suffering. May use oxygen, suction and manual treatment of airway obstruction as needed for comfort.      11/09/20 2210           Code Status History     Date Active Date Inactive Code Status Order ID Comments User Context   11/09/2020 2054 11/09/2020 2210 DNR [id number]  Etta Quill, DO ED   10/06/2020 1338 10/10/2020 2139 Full Code [id number]  Corey Harold, NP Inpatient   10/06/2020 1211 10/06/2020 1338 DNR [id number]  Spero Geralds, MD Inpatient   10/05/2020 1830 10/06/2020 1210 Full Code [id number]  Andres Labrum  D, PA-C ED   12/29/2015 1105 12/30/2015 1932 Full Code [id number]  Alexis Frock, MD Inpatient   12/12/2014 2145 12/14/2014 1809 Full Code [id number]  Etta Quill, DO ED   10/28/2014 1603 11/02/2014 2252 Full Code [id number]  Newman Pies, MD Inpatient      Advance Directive Documentation    Flowsheet Row Most Recent Value  Type of Advance Directive Healthcare Power of Attorney, Living will  Pre-existing out of facility DNR order (yellow form or pink MOST form) --  "MOST" Form in Place? --         IV Access:    Peripheral IV   Procedures and diagnostic studies:   US RENAL  Result Date: 11/09/2020 CLINICAL DATA:  Acute kidney injury EXAM: RENAL / URINARY TRACT ULTRASOUND COMPLETE COMPARISON:  10/06/2020 FINDINGS: Right Kidney: Renal measurements: 11.8 x 6.8 x 5.6 cm = volume: 237 mL. Moderate to severe right hydronephrosis. Normal echotexture. No mass. Left Kidney: Renal measurements: 10.7 x 5.0 x 7.0 cm = volume: 200 mL. Moderate to severe left hydronephrosis. Normal echotexture. No mass. Bladder: Appears normal for degree of bladder distention. Other: None. IMPRESSION: Moderate to severe bilateral hydronephrosis, not significantly changed since prior study. Electronically Signed   By: Rolm Baptise M.D.   On: 11/09/2020 21:13     Medical Consultants:   None.   Subjective:    Brenda [name] up in bed in a good mood.  Objective:    Vitals:   11/10/20 1331 11/10/20 1717 11/10/20 2205 11/11/20 0506  BP: 126/61 123/62 (!) 144/71 131/67  Pulse: 94 88 100 92  Resp: _0 Temp: 99.5 F (37.5 C) 99.9 F (37.7 C) 98.8 F (37.1 C) 98.7 F (37.1 C)  TempSrc: Oral Oral    SpO2: 98% 98% 98% 97%  Weight:      Height:       SpO2: 97 %   Intake/Output Summary (Last 24 hours) at 11/11/2020 1013 Last data filed at 11/11/2020 0053 Gross per 24 hour  Intake 328.36 ml  Output 500 ml  Net -171.64 ml    Filed Weights   11/09/20 1716 11/10/20 0539  Weight: 58.1 kg 59.8 kg    Exam: General exam: In no acute distress. Respiratory system: Good air movement and clear to auscultation. Cardiovascular system: S1 & S2 heard, RRR. No JVD. Gastrointestinal system: Abdomen is nondistended, soft and nontender.  Extremities: No pedal edema. Skin: No rashes, lesions or ulcers Psychiatry: Judgement and insight appear normal. Mood & affect appropriate.   Data Reviewed:    Labs: Basic Metabolic Panel: Recent Labs  Lab 11/09/20 1725 11/10/20 0607 11/10/20 2031 11/11/20 0504  NA  124* 126* 122* 124*  K 5.0 4.7 4.7 4.3  CL 93* 94* 95* 95*  CO2 22 22 21* 19*  GLUCOSE 174* 107* 121* 107*  BUN 26* 29* 31* 28*  CREATININE 2.52* 2.54* 2.42* 2.38*  CALCIUM 8.4* 8.6* 7.9* 7.9*    GFR Estimated Creatinine Clearance: 13.2 mL/min (A) (by C-G formula based on SCr of 2.38 mg/dL (H)). Liver Function Tests: Recent Labs  Lab 11/09/20 1725  AST 18  ALT 15  ALKPHOS 82  BILITOT 0.6  PROT 7.4  ALBUMIN 3.1*    No results for input(s): LIPASE, AMYLASE in the last 168 hours. No results for input(s): AMMONIA in the last 168 hours. Coagulation profile No results for input(s): INR, PROTIME in the last 168 hours. COVID-19 Labs  No results for input(s): DDIMER, FERRITIN, LDH,  CRP in the last 72 hours.  Lab Results  Component Value Date   SARSCOV2NAA NEGATIVE 11/09/2020   [place] NEGATIVE 10/05/2020    CBC: Recent Labs  Lab 11/09/20 1725 11/11/20 0504  WBC 15.3* 9.0  NEUTROABS 12.1* 5.4  HGB 8.9* 7.7*  HCT 27.2* 23.3*  MCV 91.6 92.8  PLT 462* 361    Cardiac Enzymes: No results for input(s): CKTOTAL, CKMB, CKMBINDEX, TROPONINI in the last 168 hours. BNP (last 3 results) No results for input(s): PROBNP in the last 8760 hours. CBG: No results for input(s): GLUCAP in the last 168 hours. D-Dimer: No results for input(s): DDIMER in the last 72 hours. Hgb A1c: No results for input(s): HGBA1C in the last 72 hours. Lipid Profile: Recent Labs    11/10/20 0607  CHOL 85  HDL 44  LDLCALC 33  TRIG 38  CHOLHDL 1.9    Thyroid function studies: No results for input(s): TSH, T4TOTAL, T3FREE, THYROIDAB in the last 72 hours.  Invalid input(s): FREET3 Anemia work up: No results for input(s): VITAMINB12, FOLATE, FERRITIN, TIBC, IRON, RETICCTPCT in the last 72 hours. Sepsis Labs: Recent Labs  Lab 11/09/20 1725 11/11/20 0504  WBC 15.3* 9.0    Microbiology Recent Results (from the past 240 hour(s))  Urine culture     Status: Abnormal (Preliminary result)    Collection Time: 11/09/20  5:25 PM   Specimen: Urine, Random  Result Value Ref Range Status   Specimen Description   Final    URINE, RANDOM Performed at [facility] [address] [place], [place] [address]    Special Requests   Final    NONE Performed at [facility], [place] [address], [place], [place] [address]    Culture (A)  Final    >=100,000 COLONIES/mL PSEUDOMONAS AERUGINOSA SUSCEPTIBILITIES TO FOLLOW Performed at [facility], [place] [address], [place], [place] [address]    Report Status PENDING  Incomplete  Resp Panel by RT-PCR (Flu A&B, Covid) Nasopharyngeal Swab     Status: None   Collection Time: 11/09/20  5:39 PM   Specimen: Nasopharyngeal Swab; Nasopharyngeal(NP) swabs in vial transport medium  Result Value Ref Range Status   SARS Coronavirus 2 by RT PCR NEGATIVE NEGATIVE Final    Comment: (NOTE) SARS-CoV-2 target nucleic acids are NOT DETECTED.  The SARS-CoV-2 RNA is generally detectable in upper respiratory specimens during the acute phase of infection. The lowest concentration of SARS-CoV-2 viral copies this assay can detect is 138 copies/mL. A negative result does not preclude SARS-Cov-2 infection and should not be used as the sole basis for treatment or other patient management decisions. A negative result may occur with  improper specimen collection/handling, submission of specimen other than nasopharyngeal swab, presence of viral mutation(s) within the areas targeted by this assay, and inadequate number of viral copies(<138 copies/mL). A negative result must be combined with clinical observations, patient history, and epidemiological information. The expected result is Negative.  Fact Sheet for Patients:  EntrepreneurPulse.com.au  Fact Sheet for Healthcare Providers:  IncredibleEmployment.be  This test is no t yet approved or cleared by the [place] FDA and  has been  authorized for detection and/or diagnosis of SARS-CoV-2 by FDA under an Emergency Use Authorization (EUA). This EUA will remain  in effect (meaning this test can be used) for the duration of the COVID-19 declaration under Section 564(b)(1) of the Act, 21 U.S.C.section 360bbb-3(b)(1), unless the authorization is terminated  or revoked sooner.  Influenza A by PCR NEGATIVE NEGATIVE Final   Influenza B by PCR NEGATIVE NEGATIVE Final    Comment: (NOTE) The Xpert Xpress SARS-CoV-2/FLU/RSV plus assay is intended as an aid in the diagnosis of influenza from Nasopharyngeal swab specimens and should not be used as a sole basis for treatment. Nasal washings and aspirates are unacceptable for Xpert Xpress SARS-CoV-2/FLU/RSV testing.  Fact Sheet for Patients: EntrepreneurPulse.com.au  Fact Sheet for Healthcare Providers: IncredibleEmployment.be  This test is not yet approved or cleared by the [place] FDA and has been authorized for detection and/or diagnosis of SARS-CoV-2 by FDA under an Emergency Use Authorization (EUA). This EUA will remain in effect (meaning this test can be used) for the duration of the COVID-19 declaration under Section 564(b)(1) of the Act, 21 U.S.C. section 360bbb-3(b)(1), unless the authorization is terminated or revoked.  Performed at [facility], [place] [address], [place],  [address]      Medications:    acidophilus  1 capsule Oral Daily   amLODipine  5 mg Oral Daily   heparin  5,000 Units Subcutaneous Q8H   melatonin  3 mg Oral QHS   multivitamin with minerals  1 tablet Oral Daily   tolvaptan  15 mg Oral Once   triamcinolone cream  1 application Topical BID   zinc sulfate  220 mg Oral Daily   Continuous Infusions:  sodium chloride 100 mL/hr at 11/10/20 2353   cefTRIAXone (ROCEPHIN)  IV 1 g (11/10/20 2107)      LOS: 1 day   Charlynne Cousins  [facility]  11/11/2020, 10:13 AM

## 2020-11-12 DIAGNOSIS — N179 Acute kidney failure, unspecified: Secondary | ICD-10-CM

## 2020-11-12 DIAGNOSIS — N1831 Chronic kidney disease, stage 3a: Secondary | ICD-10-CM

## 2020-11-12 DIAGNOSIS — N390 Urinary tract infection, site not specified: Secondary | ICD-10-CM

## 2020-11-12 DIAGNOSIS — N3000 Acute cystitis without hematuria: Secondary | ICD-10-CM

## 2020-11-12 LAB — BASIC METABOLIC PANEL
Anion gap: 10 (ref 5–15)
BUN: 28 mg/dL — ABNORMAL HIGH (ref 8–23)
CO2: 18 mmol/L — ABNORMAL LOW (ref 22–32)
Calcium: 8.3 mg/dL — ABNORMAL LOW (ref 8.9–10.3)
Chloride: 101 mmol/L (ref 98–111)
Creatinine, Ser: 2.31 mg/dL — ABNORMAL HIGH (ref 0.44–1.00)
GFR, Estimated: 20 mL/min — ABNORMAL LOW (ref >60)
Glucose, Bld: 107 mg/dL — ABNORMAL HIGH (ref 70–99)
Potassium: 4.2 mmol/L (ref 3.5–5.1)
Sodium: 129 mmol/L — ABNORMAL LOW (ref 135–145)

## 2020-11-12 LAB — CBC
HCT: 23.3 % — ABNORMAL LOW (ref 36.0–46.0)
Hemoglobin: 7.7 g/dL — ABNORMAL LOW (ref 12.0–15.0)
MCH: 30.3 pg (ref 26.0–34.0)
MCHC: 33 g/dL (ref 30.0–36.0)
MCV: 91.7 fL (ref 80.0–100.0)
Platelets: 343 10*3/uL (ref 150–400)
RBC: 2.54 MIL/uL — ABNORMAL LOW (ref 3.87–5.11)
RDW: 15.7 % — ABNORMAL HIGH (ref 11.5–15.5)
WBC: 8.3 10*3/uL (ref 4.0–10.5)
nRBC: 0 % (ref 0.0–0.2)

## 2020-11-12 LAB — URINE CULTURE: Culture: >=100000 — AB

## 2020-11-12 LAB — SODIUM: Sodium: 129 mmol/L — ABNORMAL LOW (ref 135–145)

## 2020-11-12 MED ORDER — DICLOFENAC SODIUM 1 % EX GEL: 2.0000 g | Freq: Two times a day (BID) | CUTANEOUS | Status: DC | PRN

## 2020-11-12 MED ORDER — SODIUM CHLORIDE 1 G PO TABS: 1.0000 g | ORAL_TABLET | Freq: Two times a day (BID) | ORAL | 3 refills | Status: AC

## 2020-11-12 MED ORDER — POLYSACCHARIDE IRON COMPLEX 150 MG PO CAPS: 150.0000 mg | ORAL_CAPSULE | Freq: Every day | ORAL | 3 refills | Status: AC

## 2020-11-12 NOTE — TOC Transition Note (Signed)
Transition of Care [facility]) - CM/SW Discharge Note   Patient Details  Name: Brenda [name] MRN: [id number] Date of Birth: [birth year]  [facility] CM/SW Contact:  Trish Mage, LCSW Phone Number: 11/12/2020, 10:13 AM   Clinical Narrative:   Patient who is stable for d/c today will be returning home via daughter, who is bedside.  Confirmation received of needing DME wheelchair and 3 in 1, as well as resumption of [facility] services RN, PT, OT, aide through [facility].  FAXed orders to [facility] at [phone] after calling to confirm services. Equipment ordered from [facility]. No further needs identified. TOC sign off.    Final next level of care: [place] Barriers to Discharge: No Barriers Identified   Patient Goals and CMS Choice        Discharge Placement                       Discharge Plan and Services                                     Social Determinants of Health (SDOH) Interventions     Readmission Risk Interventions No flowsheet data found.

## 2020-11-12 NOTE — Discharge Summary (Signed)
Physician Discharge Summary   Patient ID: Brenda [name] MRN: [id number] DOB/AGE: Jan 24, 1931 87 y.o.  Admit date: 11/09/2020 Discharge date: 11/12/2020  Primary Care Physician:  Burnard Bunting, MD   Recommendations for Outpatient Follow-up:  Follow up with PCP in 1-2 weeks Please obtain BMP/CBC in one week   Home Health: Home health PT OT, RN, aide Equipment/Devices: DME wheelchair, 3 and 1  Discharge Condition: stable  CODE STATUS: Partial Diet recommendation: Regular diet with fluid restriction 1500 cc   Discharge Diagnoses:     acute on chronic Hyponatremia secondary to SIADH   UTI secondary to Pseudomonas aeruginosa colonization  (Resolved) SIADH (syndrome of inappropriate ADH production) (HCC)  Essential hypertension  AKI (acute kidney injury) ([place])  CKD (chronic kidney disease) stage 3b, GFR 30-59 ml/min (HCC) Sacral decub present on admission,  Consults: Nephrology    Allergies:  No Known Allergies   DISCHARGE MEDICATIONS: Allergies as of 11/12/2020   No Known Allergies      Medication List     STOP taking these medications    allopurinol 300 MG tablet Commonly known as: ZYLOPRIM   furosemide 20 MG tablet Commonly known as: LASIX       TAKE these medications    acetaminophen 500 MG tablet Commonly known as: TYLENOL Take 1,000 mg by mouth every 6 (six) hours as needed for moderate pain.   amLODipine 5 MG tablet Commonly known as: NORVASC Take 5 mg by mouth daily.   AQUAPHOR EX Apply 1 application topically See admin instructions. 1 application each time going to bathroom   diclofenac Sodium 1 % Gel Commonly known as: VOLTAREN Apply 2 g topically 2 (two) times daily as needed (pain).   iron polysaccharides 150 MG capsule Commonly known as: NIFEREX Take 1 capsule (150 mg total) by mouth daily.   multivitamin tablet Take 1 tablet by mouth daily.   neomycin-bacitracin-polymyxin ointment Commonly known as: NEOSPORIN Apply 1  application topically See admin instructions. 1 application each time going to bathroom   OVER THE COUNTER MEDICATION Take 1 tablet by mouth daily. Vitamin D3 + K 2 in coconut oil   Phillips Colon Health Caps Take 1 capsule by mouth daily.   sodium chloride 1 g tablet Take 1 tablet (1 g total) by mouth 2 (two) times daily with a meal.   triamcinolone cream 0.1 % Commonly known as: KENALOG Apply 1 application topically 2 (two) times daily.   Vitamin B12 1000 MCG Tbcr Take 1,000 mcg by mouth daily.   zinc gluconate 50 MG tablet Take 25 mg by mouth daily.               Durable Medical Equipment  (From admission, onward)           Start     Ordered   11/12/20 0815  For home use only DME 3 n 1  Once        11/12/20 0815   11/12/20 0814  For home use only DME lightweight manual wheelchair with seat cushion  Once       Comments: Patient suffers from debility and gait instabilitywhich impairs their ability to perform daily activities like grooming in the home.  A cane or walker will not resolve  issue with performing activities of daily living. A wheelchair will allow patient to safely perform daily activities. Patient is not able to propel themselves in the home using a standard weight wheelchair due to general weakness. Patient can self propel in the lightweight wheelchair.  Length of need Lifetime. Accessories: elevating leg rests (ELRs), wheel locks, extensions and anti-tippers.   11/12/20 0814   11/12/20 0813  For home use only DME wheelchair cushion (seat and back)  Once        11/12/20 0814             Brief H and P: For complete details please refer to admission H and P, but in brief 87 y.o. female past medical history significant for frequent UTIs, prior spinal tumor surgery, left with residual numbness, chronic kidney disease stage III.  Patient was admitted 5/24-5/29 to [facility] with severe acute hyponatremia, last sodium was 108 at that time with concern  for SIADH versus reset osmole stat.  Patient was treated with 3% hypertonic saline, ultimately sodium improved to 124, creatinine at discharge was 1.39. Patient then was admitted to OSH 6/14-6/16 sodium on admission was 122, creatinine had trended up to 3.0. Patient followed up with her PCP and saw nephrology Dr. Johnney Ou.  On labs her sodium was 120, potassium 5.7, creatinine was 2.1 hence patient was sent to ED.  Hospital Course:     AKI (acute kidney injury) ([place]) on CKD stage IIIa -Unclear etiology however patient was on Lasix and allopurinol prior to admission, which were held this admission and suspected volume depletion -Creatinine peaked at 2.5 and down to 2.3 at the time of discharge.  Patient will continue to hold Lasix and allopurinol.  Hyponatremia, acute on chronic -Several prior admissions per family, at outside facility as well for severe hyponatremia -Sodium improved with salt tablets, fluid restriction and tolvaptan x1. -Patient was followed by nephrology closely, recommended for strict fluid restriction and salt tablets 1 g twice daily.  Avoid Lasix given AKI - will schedule outpatient follow-up appointment with [facility] in 1 to 2 weeks, follow BMET  Acute metabolic encephalopathy -Likely due to hyponatremia, has improved, close to her baseline mental status confirmed with daughter at the bedside  Hypertension Stable, continue amlodipine  Chronic bilateral hydronephrosis -No acute issues, no significant change on ultrasound from 5/22  ?  UTI, Pseudomonas colonization -Patient was empirically placed on Rocephin and improved clinically.  Urine culture growing Pseudomonas aeruginosa, seems to be colonization.  Antibiotics were discontinued  Chronic normocytic anemia: Likely anemia of chronic disease/CKD -Hemoglobin 7.7, has remained stable, no worsening, baseline around 8 -Will benefit from ESA, defer to outpatient nephrology  Generalized  debility Patient was evaluated by PT.  Home health PT OT RN, aide per case management. DME wheelchair, 3 n 1 ordered   Day of Discharge S: No acute complaints, wants to go home.  Yolanda Bonine is coming to visit from [place].  BP 135/64 (BP Location: Right Arm)   Pulse 97   Temp 99.5 F (37.5 C) (Oral)   Resp 17   Ht 5\' 2"  (1.575 m)   Wt 59.8 kg   SpO2 98%   BMI 24.11 kg/m   Physical Exam: General: Alert and awake oriented x3 not in any acute distress. CVS: S1-S2 clear no murmur rubs or gallops Chest: Fairly CTA B Abdomen: soft nontender, nondistended, normal bowel sounds Extremities: no cyanosis, clubbing or edema noted bilaterally Neuro: no new deficits    Get Medicines reviewed and adjusted: Please take all your medications with you for your next visit with your Primary MD  Please request your Primary MD to go over all hospital tests and procedure/radiological results at the follow up. Please ask your Primary MD to get all [facility]  records sent to his/her office.  If you experience worsening of your admission symptoms, develop shortness of breath, life threatening emergency, suicidal or homicidal thoughts you must seek medical attention immediately by calling 911 or calling your MD immediately  if symptoms less severe.  You must read complete instructions/literature along with all the possible adverse reactions/side effects for all the Medicines you take and that have been prescribed to you. Take any new Medicines after you have completely understood and accept all the possible adverse reactions/side effects.   Do not drive when taking pain medications.   Do not take more than prescribed Pain, Sleep and Anxiety Medications  Special Instructions: If you have smoked or chewed Tobacco  in the last 2 yrs please stop smoking, stop any regular Alcohol  and or any Recreational drug use.  Wear Seat belts while driving.  Please note  You were cared for by a hospitalist during  your hospital stay. Once you are discharged, your primary care physician will handle any further medical issues. Please note that NO REFILLS for any discharge medications will be authorized once you are discharged, as it is imperative that you return to your primary care physician (or establish a relationship with a primary care physician if you do not have one) for your aftercare needs so that they can reassess your need for medications and monitor your lab values.   The results of significant diagnostics from this hospitalization (including imaging, microbiology, ancillary and laboratory) are listed below for reference.      Procedures/Studies:  US RENAL  Result Date: 11/17/2020 CLINICAL DATA:  Acute kidney injury EXAM: RENAL / URINARY TRACT ULTRASOUND COMPLETE COMPARISON:  10/06/2020 FINDINGS: Right Kidney: Renal measurements: 11.8 x 6.8 x 5.6 cm = volume: 237 mL. Moderate to severe right hydronephrosis. Normal echotexture. No mass. Left Kidney: Renal measurements: 10.7 x 5.0 x 7.0 cm = volume: 200 mL. Moderate to severe left hydronephrosis. Normal echotexture. No mass. Bladder: Appears normal for degree of bladder distention. Other: None. IMPRESSION: Moderate to severe bilateral hydronephrosis, not significantly changed since prior study. Electronically Signed   By: Rolm Baptise M.D.   On: 11/17/2020 21:13      LAB RESULTS: Basic Metabolic Panel: Recent Labs  Lab 11/11/20 1048 11/11/20 1828 11/12/20 0200  NA 124* 125* 129*  129*  K 4.4  --  4.2  CL 96*  --  101  CO2 19*  --  18*  GLUCOSE 187*  --  107*  BUN 27*  --  28*  CREATININE 2.50*  --  2.31*  CALCIUM 8.1*  --  8.3*   Liver Function Tests: Recent Labs  Lab 17-Nov-2020 1725 11/11/20 1048  AST 18 35  ALT 15 26  ALKPHOS 82 70  BILITOT 0.6 0.3  PROT 7.4 6.7  ALBUMIN 3.1* 2.4*   No results for input(s): LIPASE, AMYLASE in the last 168 hours. No results for input(s): AMMONIA in the last 168 hours. CBC: Recent Labs   Lab 11/11/20 0504 11/12/20 0827  WBC 9.0 8.3  NEUTROABS 5.4  --   HGB 7.7* 7.7*  HCT 23.3* 23.3*  MCV 92.8 91.7  PLT 361 343   Cardiac Enzymes: No results for input(s): CKTOTAL, CKMB, CKMBINDEX, TROPONINI in the last 168 hours. BNP: Invalid input(s): POCBNP CBG: No results for input(s): GLUCAP in the last 168 hours.     Disposition and Follow-up: Discharge Instructions     Diet general   Complete by: As directed    Discharge instructions  Complete by: As directed    Fluid restriction 1500 cc in a day   Increase activity slowly   Complete by: As directed    No wound care   Complete by: As directed         DISPOSITION: Home health PT OT, RN, aide   DISCHARGE FOLLOW-UP  Follow-up Information     Burnard Bunting, MD Follow up in 2 week(s).   Specialty: Internal Medicine Why: for hospital follow-up Contact information: [address] [place] [address] [phone]                  Time coordinating discharge:  35 minutes  Signed:   Estill Cotta M.D. [facility] 11/12/2020, 12:28 PM

## 2020-11-12 NOTE — Progress Notes (Addendum)
[facility] Progress Note  Subjective: no new c/o, Na up 129, creat down 2.3  Vitals:   11/11/20 0506 11/11/20 1520 11/11/20 2050 11/12/20 0504  BP: 131/67 140/66 (!) 148/70 135/64  Pulse: 92 (!) 104 (!) 105 97  Resp: 19 16 20 17   Temp: 98.7 F (37.1 C) 99.3 F (37.4 C)  99.5 F (37.5 C)  TempSrc:  Oral  Oral  SpO2: 97% 99% 100% 98%  Weight:      Height:        Exam:  alert, nad   no jvd  Chest cta bilat  Cor reg no RG  Abd soft ntnd no ascites   Ext no LE edema   Alert, NF, ox3    UA 6/28  100 prot, turbid, many bact, >50 rbc/ wbc   Ucreat 51, UNa 66    Uosm 260     Assessment/ Plan:  AKI/CKD stage IIIa - unclear etiology, hx chronic bilat hydro, no change in imaging here. Baseline creat 1.2- 1.5 from 09/2020.  No history of nephrotoxic agents. Was taking po lasix at home (for low Na Rx) and suspected vol depletion related AKI. Creat peaked at 2.5 down to 2.3 at d/c. Already has f/u appt at [facility] on 7/25.  Hyponatremia - acute on chronic, hx of several prior admits per family at outside facilities and here for sometimes severe hyponatremia. Per family her Na has been down 108 at the worse on prior admit at OSH. Here Uosm 260 and UNa up at 66.  Na improved w/ salt tabs, fluid restrict and tolvaptan x 1. She has chronic issues > main Rx is fluid restriction to 1000 cc per day, also would continue NaCl 1 gm bid. Would avoid lasix given AKI d/t vol depletion. She has f/u appt at [facility] on 7/25. Will s/o.  AMS - possibly related to recurrent UTI and low Na levels. Improving.  Recurrent UTI - urine looks consistent with infection and urine cultures pending.  Started on rocephin per primary svc.   HTN - stable on amlodipine   Chronic bilateral hydronephrosis - no significant change on [place] from 10/05/20.         Rob Lenzy Kerschner 11/12/2020, 12:32 PM   Recent Labs  Lab 11/11/20 0504 11/11/20 1048 11/12/20 0200 11/12/20 0827  K 4.3 4.4 4.2  --   BUN 28* 27* 28*  --    CREATININE 2.38* 2.50* 2.31*  --   CALCIUM 7.9* 8.1* 8.3*  --   HGB 7.7*  --   --  7.7*    Inpatient medications:  acidophilus  1 capsule Oral Daily   amLODipine  5 mg Oral Daily   heparin  5,000 Units Subcutaneous Q8H   melatonin  3 mg Oral QHS   multivitamin with minerals  1 tablet Oral Daily   triamcinolone cream  1 application Topical BID   zinc sulfate  220 mg Oral Daily    sodium chloride 65 mL/hr at 11/12/20 0006   acetaminophen **OR** acetaminophen, mineral oil-hydrophilic petrolatum, neomycin-bacitracin-polymyxin, ondansetron **OR** ondansetron (ZOFRAN) IV, QUEtiapine

## 2020-11-14 DIAGNOSIS — E871 Hypo-osmolality and hyponatremia: Secondary | ICD-10-CM | POA: Diagnosis not present

## 2020-11-14 DIAGNOSIS — L89312 Pressure ulcer of right buttock, stage 2: Secondary | ICD-10-CM | POA: Diagnosis not present

## 2020-11-14 DIAGNOSIS — Z9181 History of falling: Secondary | ICD-10-CM | POA: Diagnosis not present

## 2020-11-14 DIAGNOSIS — N390 Urinary tract infection, site not specified: Secondary | ICD-10-CM | POA: Diagnosis not present

## 2020-11-14 DIAGNOSIS — N179 Acute kidney failure, unspecified: Secondary | ICD-10-CM | POA: Diagnosis not present

## 2020-11-14 DIAGNOSIS — I10 Essential (primary) hypertension: Secondary | ICD-10-CM | POA: Diagnosis not present

## 2020-11-14 DIAGNOSIS — M109 Gout, unspecified: Secondary | ICD-10-CM | POA: Diagnosis not present

## 2020-11-14 DIAGNOSIS — E876 Hypokalemia: Secondary | ICD-10-CM | POA: Diagnosis not present

## 2020-11-16 DIAGNOSIS — D649 Anemia, unspecified: Secondary | ICD-10-CM | POA: Diagnosis not present

## 2020-11-16 DIAGNOSIS — M109 Gout, unspecified: Secondary | ICD-10-CM | POA: Diagnosis not present

## 2020-11-16 DIAGNOSIS — N390 Urinary tract infection, site not specified: Secondary | ICD-10-CM | POA: Diagnosis not present

## 2020-11-16 DIAGNOSIS — I10 Essential (primary) hypertension: Secondary | ICD-10-CM | POA: Diagnosis not present

## 2020-11-16 DIAGNOSIS — G9341 Metabolic encephalopathy: Secondary | ICD-10-CM | POA: Diagnosis not present

## 2020-11-16 DIAGNOSIS — I129 Hypertensive chronic kidney disease with stage 1 through stage 4 chronic kidney disease, or unspecified chronic kidney disease: Secondary | ICD-10-CM | POA: Diagnosis not present

## 2020-11-16 DIAGNOSIS — E876 Hypokalemia: Secondary | ICD-10-CM | POA: Diagnosis not present

## 2020-11-16 DIAGNOSIS — Z9181 History of falling: Secondary | ICD-10-CM | POA: Diagnosis not present

## 2020-11-16 DIAGNOSIS — N184 Chronic kidney disease, stage 4 (severe): Secondary | ICD-10-CM | POA: Diagnosis not present

## 2020-11-16 DIAGNOSIS — N1330 Unspecified hydronephrosis: Secondary | ICD-10-CM | POA: Diagnosis not present

## 2020-11-16 DIAGNOSIS — N179 Acute kidney failure, unspecified: Secondary | ICD-10-CM | POA: Diagnosis not present

## 2020-11-16 DIAGNOSIS — E871 Hypo-osmolality and hyponatremia: Secondary | ICD-10-CM | POA: Diagnosis not present

## 2020-11-16 DIAGNOSIS — L89312 Pressure ulcer of right buttock, stage 2: Secondary | ICD-10-CM | POA: Diagnosis not present

## 2020-11-18 DIAGNOSIS — M109 Gout, unspecified: Secondary | ICD-10-CM | POA: Diagnosis not present

## 2020-11-18 DIAGNOSIS — L89312 Pressure ulcer of right buttock, stage 2: Secondary | ICD-10-CM | POA: Diagnosis not present

## 2020-11-18 DIAGNOSIS — E871 Hypo-osmolality and hyponatremia: Secondary | ICD-10-CM | POA: Diagnosis not present

## 2020-11-18 DIAGNOSIS — E876 Hypokalemia: Secondary | ICD-10-CM | POA: Diagnosis not present

## 2020-11-18 DIAGNOSIS — I10 Essential (primary) hypertension: Secondary | ICD-10-CM | POA: Diagnosis not present

## 2020-11-18 DIAGNOSIS — Z9181 History of falling: Secondary | ICD-10-CM | POA: Diagnosis not present

## 2020-11-18 DIAGNOSIS — N390 Urinary tract infection, site not specified: Secondary | ICD-10-CM | POA: Diagnosis not present

## 2020-11-18 DIAGNOSIS — N179 Acute kidney failure, unspecified: Secondary | ICD-10-CM | POA: Diagnosis not present

## 2020-11-22 DIAGNOSIS — L89312 Pressure ulcer of right buttock, stage 2: Secondary | ICD-10-CM | POA: Diagnosis not present

## 2020-11-22 DIAGNOSIS — I10 Essential (primary) hypertension: Secondary | ICD-10-CM | POA: Diagnosis not present

## 2020-11-22 DIAGNOSIS — Z9181 History of falling: Secondary | ICD-10-CM | POA: Diagnosis not present

## 2020-11-22 DIAGNOSIS — I129 Hypertensive chronic kidney disease with stage 1 through stage 4 chronic kidney disease, or unspecified chronic kidney disease: Secondary | ICD-10-CM | POA: Diagnosis not present

## 2020-11-22 DIAGNOSIS — E876 Hypokalemia: Secondary | ICD-10-CM | POA: Diagnosis not present

## 2020-11-22 DIAGNOSIS — N184 Chronic kidney disease, stage 4 (severe): Secondary | ICD-10-CM | POA: Diagnosis not present

## 2020-11-22 DIAGNOSIS — M109 Gout, unspecified: Secondary | ICD-10-CM | POA: Diagnosis not present

## 2020-11-22 DIAGNOSIS — E871 Hypo-osmolality and hyponatremia: Secondary | ICD-10-CM | POA: Diagnosis not present

## 2020-11-22 DIAGNOSIS — N390 Urinary tract infection, site not specified: Secondary | ICD-10-CM | POA: Diagnosis not present

## 2020-11-22 DIAGNOSIS — N179 Acute kidney failure, unspecified: Secondary | ICD-10-CM | POA: Diagnosis not present

## 2020-11-23 DIAGNOSIS — Z9181 History of falling: Secondary | ICD-10-CM | POA: Diagnosis not present

## 2020-11-23 DIAGNOSIS — E876 Hypokalemia: Secondary | ICD-10-CM | POA: Diagnosis not present

## 2020-11-23 DIAGNOSIS — M109 Gout, unspecified: Secondary | ICD-10-CM | POA: Diagnosis not present

## 2020-11-23 DIAGNOSIS — N179 Acute kidney failure, unspecified: Secondary | ICD-10-CM | POA: Diagnosis not present

## 2020-11-23 DIAGNOSIS — L89312 Pressure ulcer of right buttock, stage 2: Secondary | ICD-10-CM | POA: Diagnosis not present

## 2020-11-23 DIAGNOSIS — N390 Urinary tract infection, site not specified: Secondary | ICD-10-CM | POA: Diagnosis not present

## 2020-11-23 DIAGNOSIS — I10 Essential (primary) hypertension: Secondary | ICD-10-CM | POA: Diagnosis not present

## 2020-11-23 DIAGNOSIS — E871 Hypo-osmolality and hyponatremia: Secondary | ICD-10-CM | POA: Diagnosis not present

## 2020-11-25 DIAGNOSIS — N390 Urinary tract infection, site not specified: Secondary | ICD-10-CM | POA: Diagnosis not present

## 2020-11-25 DIAGNOSIS — N179 Acute kidney failure, unspecified: Secondary | ICD-10-CM | POA: Diagnosis not present

## 2020-11-25 DIAGNOSIS — L89312 Pressure ulcer of right buttock, stage 2: Secondary | ICD-10-CM | POA: Diagnosis not present

## 2020-11-25 DIAGNOSIS — M109 Gout, unspecified: Secondary | ICD-10-CM | POA: Diagnosis not present

## 2020-11-25 DIAGNOSIS — I10 Essential (primary) hypertension: Secondary | ICD-10-CM | POA: Diagnosis not present

## 2020-11-25 DIAGNOSIS — E876 Hypokalemia: Secondary | ICD-10-CM | POA: Diagnosis not present

## 2020-11-25 DIAGNOSIS — E871 Hypo-osmolality and hyponatremia: Secondary | ICD-10-CM | POA: Diagnosis not present

## 2020-11-25 DIAGNOSIS — Z9181 History of falling: Secondary | ICD-10-CM | POA: Diagnosis not present

## 2020-11-26 DIAGNOSIS — E871 Hypo-osmolality and hyponatremia: Secondary | ICD-10-CM | POA: Diagnosis not present

## 2020-11-26 DIAGNOSIS — M109 Gout, unspecified: Secondary | ICD-10-CM | POA: Diagnosis not present

## 2020-11-26 DIAGNOSIS — E876 Hypokalemia: Secondary | ICD-10-CM | POA: Diagnosis not present

## 2020-11-26 DIAGNOSIS — N179 Acute kidney failure, unspecified: Secondary | ICD-10-CM | POA: Diagnosis not present

## 2020-11-26 DIAGNOSIS — N390 Urinary tract infection, site not specified: Secondary | ICD-10-CM | POA: Diagnosis not present

## 2020-11-26 DIAGNOSIS — Z9181 History of falling: Secondary | ICD-10-CM | POA: Diagnosis not present

## 2020-11-26 DIAGNOSIS — I10 Essential (primary) hypertension: Secondary | ICD-10-CM | POA: Diagnosis not present

## 2020-11-26 DIAGNOSIS — L89312 Pressure ulcer of right buttock, stage 2: Secondary | ICD-10-CM | POA: Diagnosis not present

## 2020-11-28 DIAGNOSIS — I10 Essential (primary) hypertension: Secondary | ICD-10-CM | POA: Diagnosis not present

## 2020-11-28 DIAGNOSIS — E871 Hypo-osmolality and hyponatremia: Secondary | ICD-10-CM | POA: Diagnosis not present

## 2020-11-28 DIAGNOSIS — N179 Acute kidney failure, unspecified: Secondary | ICD-10-CM | POA: Diagnosis not present

## 2020-11-28 DIAGNOSIS — N390 Urinary tract infection, site not specified: Secondary | ICD-10-CM | POA: Diagnosis not present

## 2020-11-28 DIAGNOSIS — E876 Hypokalemia: Secondary | ICD-10-CM | POA: Diagnosis not present

## 2020-11-28 DIAGNOSIS — R2681 Unsteadiness on feet: Secondary | ICD-10-CM | POA: Diagnosis not present

## 2020-11-28 DIAGNOSIS — L89312 Pressure ulcer of right buttock, stage 2: Secondary | ICD-10-CM | POA: Diagnosis not present

## 2020-11-28 DIAGNOSIS — M109 Gout, unspecified: Secondary | ICD-10-CM | POA: Diagnosis not present

## 2020-11-28 DIAGNOSIS — Z9181 History of falling: Secondary | ICD-10-CM | POA: Diagnosis not present

## 2020-11-29 DIAGNOSIS — I129 Hypertensive chronic kidney disease with stage 1 through stage 4 chronic kidney disease, or unspecified chronic kidney disease: Secondary | ICD-10-CM | POA: Diagnosis not present

## 2020-11-29 DIAGNOSIS — E871 Hypo-osmolality and hyponatremia: Secondary | ICD-10-CM | POA: Diagnosis not present

## 2020-11-29 DIAGNOSIS — N179 Acute kidney failure, unspecified: Secondary | ICD-10-CM | POA: Diagnosis not present

## 2020-11-29 DIAGNOSIS — N184 Chronic kidney disease, stage 4 (severe): Secondary | ICD-10-CM | POA: Diagnosis not present

## 2020-11-30 DIAGNOSIS — L89312 Pressure ulcer of right buttock, stage 2: Secondary | ICD-10-CM | POA: Diagnosis not present

## 2020-11-30 DIAGNOSIS — I10 Essential (primary) hypertension: Secondary | ICD-10-CM | POA: Diagnosis not present

## 2020-11-30 DIAGNOSIS — R2681 Unsteadiness on feet: Secondary | ICD-10-CM | POA: Diagnosis not present

## 2020-11-30 DIAGNOSIS — Z9181 History of falling: Secondary | ICD-10-CM | POA: Diagnosis not present

## 2020-11-30 DIAGNOSIS — E876 Hypokalemia: Secondary | ICD-10-CM | POA: Diagnosis not present

## 2020-11-30 DIAGNOSIS — E871 Hypo-osmolality and hyponatremia: Secondary | ICD-10-CM | POA: Diagnosis not present

## 2020-11-30 DIAGNOSIS — N390 Urinary tract infection, site not specified: Secondary | ICD-10-CM | POA: Diagnosis not present

## 2020-11-30 DIAGNOSIS — M109 Gout, unspecified: Secondary | ICD-10-CM | POA: Diagnosis not present

## 2020-11-30 DIAGNOSIS — N179 Acute kidney failure, unspecified: Secondary | ICD-10-CM | POA: Diagnosis not present

## 2020-12-01 DIAGNOSIS — I10 Essential (primary) hypertension: Secondary | ICD-10-CM | POA: Diagnosis not present

## 2020-12-01 DIAGNOSIS — E871 Hypo-osmolality and hyponatremia: Secondary | ICD-10-CM | POA: Diagnosis not present

## 2020-12-01 DIAGNOSIS — Z9181 History of falling: Secondary | ICD-10-CM | POA: Diagnosis not present

## 2020-12-01 DIAGNOSIS — R2681 Unsteadiness on feet: Secondary | ICD-10-CM | POA: Diagnosis not present

## 2020-12-01 DIAGNOSIS — E876 Hypokalemia: Secondary | ICD-10-CM | POA: Diagnosis not present

## 2020-12-01 DIAGNOSIS — M109 Gout, unspecified: Secondary | ICD-10-CM | POA: Diagnosis not present

## 2020-12-01 DIAGNOSIS — N390 Urinary tract infection, site not specified: Secondary | ICD-10-CM | POA: Diagnosis not present

## 2020-12-01 DIAGNOSIS — N179 Acute kidney failure, unspecified: Secondary | ICD-10-CM | POA: Diagnosis not present

## 2020-12-01 DIAGNOSIS — L89312 Pressure ulcer of right buttock, stage 2: Secondary | ICD-10-CM | POA: Diagnosis not present

## 2020-12-02 DIAGNOSIS — L89312 Pressure ulcer of right buttock, stage 2: Secondary | ICD-10-CM | POA: Diagnosis not present

## 2020-12-02 DIAGNOSIS — M109 Gout, unspecified: Secondary | ICD-10-CM | POA: Diagnosis not present

## 2020-12-02 DIAGNOSIS — I10 Essential (primary) hypertension: Secondary | ICD-10-CM | POA: Diagnosis not present

## 2020-12-02 DIAGNOSIS — R2681 Unsteadiness on feet: Secondary | ICD-10-CM | POA: Diagnosis not present

## 2020-12-02 DIAGNOSIS — N179 Acute kidney failure, unspecified: Secondary | ICD-10-CM | POA: Diagnosis not present

## 2020-12-02 DIAGNOSIS — E871 Hypo-osmolality and hyponatremia: Secondary | ICD-10-CM | POA: Diagnosis not present

## 2020-12-02 DIAGNOSIS — Z9181 History of falling: Secondary | ICD-10-CM | POA: Diagnosis not present

## 2020-12-02 DIAGNOSIS — N390 Urinary tract infection, site not specified: Secondary | ICD-10-CM | POA: Diagnosis not present

## 2020-12-02 DIAGNOSIS — E876 Hypokalemia: Secondary | ICD-10-CM | POA: Diagnosis not present

## 2020-12-03 DIAGNOSIS — Z9181 History of falling: Secondary | ICD-10-CM | POA: Diagnosis not present

## 2020-12-03 DIAGNOSIS — I10 Essential (primary) hypertension: Secondary | ICD-10-CM | POA: Diagnosis not present

## 2020-12-03 DIAGNOSIS — E871 Hypo-osmolality and hyponatremia: Secondary | ICD-10-CM | POA: Diagnosis not present

## 2020-12-03 DIAGNOSIS — M109 Gout, unspecified: Secondary | ICD-10-CM | POA: Diagnosis not present

## 2020-12-03 DIAGNOSIS — E876 Hypokalemia: Secondary | ICD-10-CM | POA: Diagnosis not present

## 2020-12-03 DIAGNOSIS — N179 Acute kidney failure, unspecified: Secondary | ICD-10-CM | POA: Diagnosis not present

## 2020-12-03 DIAGNOSIS — L89312 Pressure ulcer of right buttock, stage 2: Secondary | ICD-10-CM | POA: Diagnosis not present

## 2020-12-03 DIAGNOSIS — R2681 Unsteadiness on feet: Secondary | ICD-10-CM | POA: Diagnosis not present

## 2020-12-03 DIAGNOSIS — N390 Urinary tract infection, site not specified: Secondary | ICD-10-CM | POA: Diagnosis not present

## 2020-12-06 DIAGNOSIS — N179 Acute kidney failure, unspecified: Secondary | ICD-10-CM | POA: Diagnosis not present

## 2020-12-06 DIAGNOSIS — R2681 Unsteadiness on feet: Secondary | ICD-10-CM | POA: Diagnosis not present

## 2020-12-06 DIAGNOSIS — Z9181 History of falling: Secondary | ICD-10-CM | POA: Diagnosis not present

## 2020-12-06 DIAGNOSIS — N1330 Unspecified hydronephrosis: Secondary | ICD-10-CM | POA: Diagnosis not present

## 2020-12-06 DIAGNOSIS — E871 Hypo-osmolality and hyponatremia: Secondary | ICD-10-CM | POA: Diagnosis not present

## 2020-12-06 DIAGNOSIS — M109 Gout, unspecified: Secondary | ICD-10-CM | POA: Diagnosis not present

## 2020-12-06 DIAGNOSIS — E876 Hypokalemia: Secondary | ICD-10-CM | POA: Diagnosis not present

## 2020-12-06 DIAGNOSIS — N390 Urinary tract infection, site not specified: Secondary | ICD-10-CM | POA: Diagnosis not present

## 2020-12-06 DIAGNOSIS — E875 Hyperkalemia: Secondary | ICD-10-CM | POA: Diagnosis not present

## 2020-12-06 DIAGNOSIS — I10 Essential (primary) hypertension: Secondary | ICD-10-CM | POA: Diagnosis not present

## 2020-12-06 DIAGNOSIS — L89312 Pressure ulcer of right buttock, stage 2: Secondary | ICD-10-CM | POA: Diagnosis not present

## 2020-12-07 DIAGNOSIS — M109 Gout, unspecified: Secondary | ICD-10-CM | POA: Diagnosis not present

## 2020-12-07 DIAGNOSIS — Z9181 History of falling: Secondary | ICD-10-CM | POA: Diagnosis not present

## 2020-12-07 DIAGNOSIS — L89312 Pressure ulcer of right buttock, stage 2: Secondary | ICD-10-CM | POA: Diagnosis not present

## 2020-12-07 DIAGNOSIS — I10 Essential (primary) hypertension: Secondary | ICD-10-CM | POA: Diagnosis not present

## 2020-12-07 DIAGNOSIS — E871 Hypo-osmolality and hyponatremia: Secondary | ICD-10-CM | POA: Diagnosis not present

## 2020-12-07 DIAGNOSIS — E876 Hypokalemia: Secondary | ICD-10-CM | POA: Diagnosis not present

## 2020-12-07 DIAGNOSIS — N179 Acute kidney failure, unspecified: Secondary | ICD-10-CM | POA: Diagnosis not present

## 2020-12-07 DIAGNOSIS — N390 Urinary tract infection, site not specified: Secondary | ICD-10-CM | POA: Diagnosis not present

## 2020-12-07 DIAGNOSIS — R2681 Unsteadiness on feet: Secondary | ICD-10-CM | POA: Diagnosis not present

## 2020-12-08 DIAGNOSIS — N3021 Other chronic cystitis with hematuria: Secondary | ICD-10-CM | POA: Diagnosis not present

## 2020-12-08 DIAGNOSIS — N390 Urinary tract infection, site not specified: Secondary | ICD-10-CM | POA: Diagnosis not present

## 2020-12-09 DIAGNOSIS — I10 Essential (primary) hypertension: Secondary | ICD-10-CM | POA: Diagnosis not present

## 2020-12-09 DIAGNOSIS — E871 Hypo-osmolality and hyponatremia: Secondary | ICD-10-CM | POA: Diagnosis not present

## 2020-12-09 DIAGNOSIS — R2681 Unsteadiness on feet: Secondary | ICD-10-CM | POA: Diagnosis not present

## 2020-12-09 DIAGNOSIS — N179 Acute kidney failure, unspecified: Secondary | ICD-10-CM | POA: Diagnosis not present

## 2020-12-09 DIAGNOSIS — Z9181 History of falling: Secondary | ICD-10-CM | POA: Diagnosis not present

## 2020-12-09 DIAGNOSIS — M109 Gout, unspecified: Secondary | ICD-10-CM | POA: Diagnosis not present

## 2020-12-09 DIAGNOSIS — L89312 Pressure ulcer of right buttock, stage 2: Secondary | ICD-10-CM | POA: Diagnosis not present

## 2020-12-09 DIAGNOSIS — N390 Urinary tract infection, site not specified: Secondary | ICD-10-CM | POA: Diagnosis not present

## 2020-12-09 DIAGNOSIS — E876 Hypokalemia: Secondary | ICD-10-CM | POA: Diagnosis not present

## 2020-12-13 DIAGNOSIS — N183 Chronic kidney disease, stage 3 unspecified: Secondary | ICD-10-CM | POA: Diagnosis not present

## 2020-12-13 DIAGNOSIS — E871 Hypo-osmolality and hyponatremia: Secondary | ICD-10-CM | POA: Diagnosis not present

## 2020-12-13 DIAGNOSIS — I129 Hypertensive chronic kidney disease with stage 1 through stage 4 chronic kidney disease, or unspecified chronic kidney disease: Secondary | ICD-10-CM | POA: Diagnosis not present

## 2020-12-13 DIAGNOSIS — D649 Anemia, unspecified: Secondary | ICD-10-CM | POA: Diagnosis not present

## 2020-12-13 DIAGNOSIS — N184 Chronic kidney disease, stage 4 (severe): Secondary | ICD-10-CM | POA: Diagnosis not present

## 2020-12-13 DIAGNOSIS — R531 Weakness: Secondary | ICD-10-CM | POA: Diagnosis not present

## 2020-12-15 DIAGNOSIS — I10 Essential (primary) hypertension: Secondary | ICD-10-CM | POA: Diagnosis not present

## 2020-12-15 DIAGNOSIS — N179 Acute kidney failure, unspecified: Secondary | ICD-10-CM | POA: Diagnosis not present

## 2020-12-15 DIAGNOSIS — L89312 Pressure ulcer of right buttock, stage 2: Secondary | ICD-10-CM | POA: Diagnosis not present

## 2020-12-15 DIAGNOSIS — R2681 Unsteadiness on feet: Secondary | ICD-10-CM | POA: Diagnosis not present

## 2020-12-15 DIAGNOSIS — E871 Hypo-osmolality and hyponatremia: Secondary | ICD-10-CM | POA: Diagnosis not present

## 2020-12-15 DIAGNOSIS — E876 Hypokalemia: Secondary | ICD-10-CM | POA: Diagnosis not present

## 2020-12-15 DIAGNOSIS — N390 Urinary tract infection, site not specified: Secondary | ICD-10-CM | POA: Diagnosis not present

## 2020-12-15 DIAGNOSIS — Z9181 History of falling: Secondary | ICD-10-CM | POA: Diagnosis not present

## 2020-12-15 DIAGNOSIS — M109 Gout, unspecified: Secondary | ICD-10-CM | POA: Diagnosis not present

## 2020-12-20 DIAGNOSIS — R2681 Unsteadiness on feet: Secondary | ICD-10-CM | POA: Diagnosis not present

## 2020-12-20 DIAGNOSIS — E876 Hypokalemia: Secondary | ICD-10-CM | POA: Diagnosis not present

## 2020-12-20 DIAGNOSIS — Z9181 History of falling: Secondary | ICD-10-CM | POA: Diagnosis not present

## 2020-12-20 DIAGNOSIS — N178 Other acute kidney failure: Secondary | ICD-10-CM | POA: Diagnosis not present

## 2020-12-20 DIAGNOSIS — N179 Acute kidney failure, unspecified: Secondary | ICD-10-CM | POA: Diagnosis not present

## 2020-12-20 DIAGNOSIS — M109 Gout, unspecified: Secondary | ICD-10-CM | POA: Diagnosis not present

## 2020-12-20 DIAGNOSIS — L89312 Pressure ulcer of right buttock, stage 2: Secondary | ICD-10-CM | POA: Diagnosis not present

## 2020-12-20 DIAGNOSIS — E871 Hypo-osmolality and hyponatremia: Secondary | ICD-10-CM | POA: Diagnosis not present

## 2020-12-20 DIAGNOSIS — N390 Urinary tract infection, site not specified: Secondary | ICD-10-CM | POA: Diagnosis not present

## 2020-12-20 DIAGNOSIS — I10 Essential (primary) hypertension: Secondary | ICD-10-CM | POA: Diagnosis not present

## 2020-12-22 DIAGNOSIS — L89312 Pressure ulcer of right buttock, stage 2: Secondary | ICD-10-CM | POA: Diagnosis not present

## 2020-12-22 DIAGNOSIS — R2681 Unsteadiness on feet: Secondary | ICD-10-CM | POA: Diagnosis not present

## 2020-12-22 DIAGNOSIS — E876 Hypokalemia: Secondary | ICD-10-CM | POA: Diagnosis not present

## 2020-12-22 DIAGNOSIS — N390 Urinary tract infection, site not specified: Secondary | ICD-10-CM | POA: Diagnosis not present

## 2020-12-22 DIAGNOSIS — N179 Acute kidney failure, unspecified: Secondary | ICD-10-CM | POA: Diagnosis not present

## 2020-12-22 DIAGNOSIS — E871 Hypo-osmolality and hyponatremia: Secondary | ICD-10-CM | POA: Diagnosis not present

## 2020-12-22 DIAGNOSIS — I10 Essential (primary) hypertension: Secondary | ICD-10-CM | POA: Diagnosis not present

## 2020-12-22 DIAGNOSIS — M109 Gout, unspecified: Secondary | ICD-10-CM | POA: Diagnosis not present

## 2020-12-22 DIAGNOSIS — Z9181 History of falling: Secondary | ICD-10-CM | POA: Diagnosis not present

## 2020-12-27 DIAGNOSIS — I10 Essential (primary) hypertension: Secondary | ICD-10-CM | POA: Diagnosis not present

## 2020-12-27 DIAGNOSIS — E876 Hypokalemia: Secondary | ICD-10-CM | POA: Diagnosis not present

## 2020-12-27 DIAGNOSIS — R2681 Unsteadiness on feet: Secondary | ICD-10-CM | POA: Diagnosis not present

## 2020-12-27 DIAGNOSIS — L89312 Pressure ulcer of right buttock, stage 2: Secondary | ICD-10-CM | POA: Diagnosis not present

## 2020-12-27 DIAGNOSIS — N390 Urinary tract infection, site not specified: Secondary | ICD-10-CM | POA: Diagnosis not present

## 2020-12-27 DIAGNOSIS — E871 Hypo-osmolality and hyponatremia: Secondary | ICD-10-CM | POA: Diagnosis not present

## 2020-12-27 DIAGNOSIS — Z9181 History of falling: Secondary | ICD-10-CM | POA: Diagnosis not present

## 2020-12-27 DIAGNOSIS — N179 Acute kidney failure, unspecified: Secondary | ICD-10-CM | POA: Diagnosis not present

## 2020-12-27 DIAGNOSIS — M109 Gout, unspecified: Secondary | ICD-10-CM | POA: Diagnosis not present

## 2020-12-28 DIAGNOSIS — N329 Bladder disorder, unspecified: Secondary | ICD-10-CM | POA: Diagnosis not present

## 2020-12-28 DIAGNOSIS — N3021 Other chronic cystitis with hematuria: Secondary | ICD-10-CM | POA: Diagnosis not present

## 2020-12-28 DIAGNOSIS — D649 Anemia, unspecified: Secondary | ICD-10-CM | POA: Diagnosis not present

## 2020-12-28 DIAGNOSIS — I129 Hypertensive chronic kidney disease with stage 1 through stage 4 chronic kidney disease, or unspecified chronic kidney disease: Secondary | ICD-10-CM | POA: Diagnosis not present

## 2020-12-28 DIAGNOSIS — N1330 Unspecified hydronephrosis: Secondary | ICD-10-CM | POA: Diagnosis not present

## 2020-12-28 DIAGNOSIS — N3289 Other specified disorders of bladder: Secondary | ICD-10-CM | POA: Diagnosis not present

## 2020-12-28 DIAGNOSIS — E871 Hypo-osmolality and hyponatremia: Secondary | ICD-10-CM | POA: Diagnosis not present

## 2020-12-28 DIAGNOSIS — N184 Chronic kidney disease, stage 4 (severe): Secondary | ICD-10-CM | POA: Diagnosis not present

## 2021-01-06 DIAGNOSIS — L570 Actinic keratosis: Secondary | ICD-10-CM | POA: Diagnosis not present

## 2021-01-11 DIAGNOSIS — N390 Urinary tract infection, site not specified: Secondary | ICD-10-CM | POA: Diagnosis not present

## 2021-01-11 DIAGNOSIS — N3021 Other chronic cystitis with hematuria: Secondary | ICD-10-CM | POA: Diagnosis not present

## 2021-01-11 DIAGNOSIS — N1370 Vesicoureteral-reflux, unspecified: Secondary | ICD-10-CM | POA: Diagnosis not present

## 2021-01-13 DIAGNOSIS — Z130 Encounter for screening for diseases of the blood and blood-forming organs and certain disorders involving the immune mechanism: Secondary | ICD-10-CM | POA: Diagnosis not present

## 2021-01-13 DIAGNOSIS — E871 Hypo-osmolality and hyponatremia: Secondary | ICD-10-CM | POA: Diagnosis not present

## 2021-01-13 DIAGNOSIS — R531 Weakness: Secondary | ICD-10-CM | POA: Diagnosis not present

## 2021-01-13 DIAGNOSIS — N1330 Unspecified hydronephrosis: Secondary | ICD-10-CM | POA: Diagnosis not present

## 2021-01-13 DIAGNOSIS — N390 Urinary tract infection, site not specified: Secondary | ICD-10-CM | POA: Diagnosis not present

## 2021-01-13 DIAGNOSIS — N179 Acute kidney failure, unspecified: Secondary | ICD-10-CM | POA: Diagnosis not present

## 2021-01-13 DIAGNOSIS — Z139 Encounter for screening, unspecified: Secondary | ICD-10-CM | POA: Diagnosis not present

## 2021-01-13 DIAGNOSIS — I10 Essential (primary) hypertension: Secondary | ICD-10-CM | POA: Diagnosis not present

## 2021-01-13 DIAGNOSIS — N189 Chronic kidney disease, unspecified: Secondary | ICD-10-CM | POA: Diagnosis not present

## 2021-01-13 DIAGNOSIS — N184 Chronic kidney disease, stage 4 (severe): Secondary | ICD-10-CM | POA: Diagnosis not present

## 2021-01-13 DIAGNOSIS — N13722 Vesicoureteral-reflux with reflux nephropathy without hydroureter, bilateral: Secondary | ICD-10-CM | POA: Diagnosis not present

## 2021-01-13 DIAGNOSIS — N183 Chronic kidney disease, stage 3 unspecified: Secondary | ICD-10-CM | POA: Diagnosis not present

## 2021-01-13 DIAGNOSIS — M1A9XX0 Chronic gout, unspecified, without tophus (tophi): Secondary | ICD-10-CM | POA: Diagnosis not present

## 2021-01-24 DIAGNOSIS — N3020 Other chronic cystitis without hematuria: Secondary | ICD-10-CM | POA: Diagnosis not present

## 2021-01-24 DIAGNOSIS — E871 Hypo-osmolality and hyponatremia: Secondary | ICD-10-CM | POA: Diagnosis not present

## 2021-01-24 DIAGNOSIS — Z7689 Persons encountering health services in other specified circumstances: Secondary | ICD-10-CM | POA: Diagnosis not present

## 2021-01-28 DIAGNOSIS — N3021 Other chronic cystitis with hematuria: Secondary | ICD-10-CM | POA: Diagnosis not present

## 2021-01-28 DIAGNOSIS — N1370 Vesicoureteral-reflux, unspecified: Secondary | ICD-10-CM | POA: Diagnosis not present

## 2021-01-28 DIAGNOSIS — R8290 Unspecified abnormal findings in urine: Secondary | ICD-10-CM | POA: Diagnosis not present

## 2021-02-10 DIAGNOSIS — N1370 Vesicoureteral-reflux, unspecified: Secondary | ICD-10-CM | POA: Diagnosis not present

## 2021-02-10 DIAGNOSIS — R339 Retention of urine, unspecified: Secondary | ICD-10-CM | POA: Diagnosis not present

## 2021-02-10 DIAGNOSIS — N3946 Mixed incontinence: Secondary | ICD-10-CM | POA: Diagnosis not present

## 2021-02-10 DIAGNOSIS — N3021 Other chronic cystitis with hematuria: Secondary | ICD-10-CM | POA: Diagnosis not present

## 2021-02-12 DIAGNOSIS — R531 Weakness: Secondary | ICD-10-CM | POA: Diagnosis not present

## 2021-02-12 DIAGNOSIS — N183 Chronic kidney disease, stage 3 unspecified: Secondary | ICD-10-CM | POA: Diagnosis not present

## 2021-02-14 DIAGNOSIS — N184 Chronic kidney disease, stage 4 (severe): Secondary | ICD-10-CM | POA: Diagnosis not present

## 2021-02-15 DIAGNOSIS — D631 Anemia in chronic kidney disease: Secondary | ICD-10-CM | POA: Diagnosis not present

## 2021-02-15 DIAGNOSIS — I129 Hypertensive chronic kidney disease with stage 1 through stage 4 chronic kidney disease, or unspecified chronic kidney disease: Secondary | ICD-10-CM | POA: Diagnosis not present

## 2021-02-15 DIAGNOSIS — E871 Hypo-osmolality and hyponatremia: Secondary | ICD-10-CM | POA: Diagnosis not present

## 2021-02-15 DIAGNOSIS — N184 Chronic kidney disease, stage 4 (severe): Secondary | ICD-10-CM | POA: Diagnosis not present

## 2021-02-15 DIAGNOSIS — N13722 Vesicoureteral-reflux with reflux nephropathy without hydroureter, bilateral: Secondary | ICD-10-CM | POA: Diagnosis not present

## 2021-02-18 DIAGNOSIS — N3021 Other chronic cystitis with hematuria: Secondary | ICD-10-CM | POA: Diagnosis not present

## 2021-02-18 DIAGNOSIS — N1370 Vesicoureteral-reflux, unspecified: Secondary | ICD-10-CM | POA: Diagnosis not present

## 2021-02-18 DIAGNOSIS — N390 Urinary tract infection, site not specified: Secondary | ICD-10-CM | POA: Diagnosis not present

## 2021-03-15 DIAGNOSIS — N183 Chronic kidney disease, stage 3 unspecified: Secondary | ICD-10-CM | POA: Diagnosis not present

## 2021-03-15 DIAGNOSIS — R531 Weakness: Secondary | ICD-10-CM | POA: Diagnosis not present

## 2021-04-26 DIAGNOSIS — Z862 Personal history of diseases of the blood and blood-forming organs and certain disorders involving the immune mechanism: Secondary | ICD-10-CM | POA: Diagnosis not present

## 2021-04-26 DIAGNOSIS — E871 Hypo-osmolality and hyponatremia: Secondary | ICD-10-CM | POA: Diagnosis not present

## 2021-04-26 DIAGNOSIS — N3941 Urge incontinence: Secondary | ICD-10-CM | POA: Diagnosis not present

## 2021-05-27 DIAGNOSIS — N3021 Other chronic cystitis with hematuria: Secondary | ICD-10-CM | POA: Diagnosis not present

## 2021-05-27 DIAGNOSIS — R3915 Urgency of urination: Secondary | ICD-10-CM | POA: Diagnosis not present

## 2021-05-30 DIAGNOSIS — N184 Chronic kidney disease, stage 4 (severe): Secondary | ICD-10-CM | POA: Diagnosis not present

## 2021-06-06 DIAGNOSIS — E559 Vitamin D deficiency, unspecified: Secondary | ICD-10-CM | POA: Diagnosis not present

## 2021-06-06 DIAGNOSIS — D631 Anemia in chronic kidney disease: Secondary | ICD-10-CM | POA: Diagnosis not present

## 2021-06-06 DIAGNOSIS — I129 Hypertensive chronic kidney disease with stage 1 through stage 4 chronic kidney disease, or unspecified chronic kidney disease: Secondary | ICD-10-CM | POA: Diagnosis not present

## 2021-06-06 DIAGNOSIS — N184 Chronic kidney disease, stage 4 (severe): Secondary | ICD-10-CM | POA: Diagnosis not present

## 2021-06-06 DIAGNOSIS — E871 Hypo-osmolality and hyponatremia: Secondary | ICD-10-CM | POA: Diagnosis not present

## 2021-06-06 DIAGNOSIS — N390 Urinary tract infection, site not specified: Secondary | ICD-10-CM | POA: Diagnosis not present

## 2021-06-07 ENCOUNTER — Telehealth: Payer: Self-pay | Admitting: Orthopaedic Surgery

## 2021-06-07 ENCOUNTER — Other Ambulatory Visit: Payer: Self-pay | Admitting: Physician Assistant

## 2021-06-07 MED ORDER — DICLOFENAC SODIUM 1 % EX GEL: 2.0000 g | Freq: Two times a day (BID) | CUTANEOUS | Status: AC | PRN

## 2021-06-07 NOTE — Telephone Encounter (Signed)
Pt's daughter [name] called requesting a new refill of diclofenac sodium for pt. Please send to pharmacy on file. Please call pt's daughter when medication has been sent in at [phone].

## 2021-06-07 NOTE — Telephone Encounter (Signed)
Sent in

## 2021-06-08 NOTE — Telephone Encounter (Signed)
Daughter aware.

## 2021-07-29 DIAGNOSIS — I10 Essential (primary) hypertension: Secondary | ICD-10-CM | POA: Diagnosis not present

## 2021-07-29 DIAGNOSIS — N390 Urinary tract infection, site not specified: Secondary | ICD-10-CM | POA: Diagnosis not present

## 2021-07-29 DIAGNOSIS — E871 Hypo-osmolality and hyponatremia: Secondary | ICD-10-CM | POA: Diagnosis not present

## 2021-07-29 DIAGNOSIS — T452X1D Poisoning by vitamins, accidental (unintentional), subsequent encounter: Secondary | ICD-10-CM | POA: Diagnosis not present

## 2021-07-29 DIAGNOSIS — Z79899 Other long term (current) drug therapy: Secondary | ICD-10-CM | POA: Diagnosis not present

## 2022-07-02 IMAGING — US US RENAL
1 series · 15 of 25 positions shown · non-contrast
Comparison: February 12, 2018.

CLINICAL DATA: Renal failure.

EXAM:
RENAL / URINARY TRACT ULTRASOUND COMPLETE

[Series 1: us renal mc & wl · 15 of 28 slices shown]
[im 1/28]
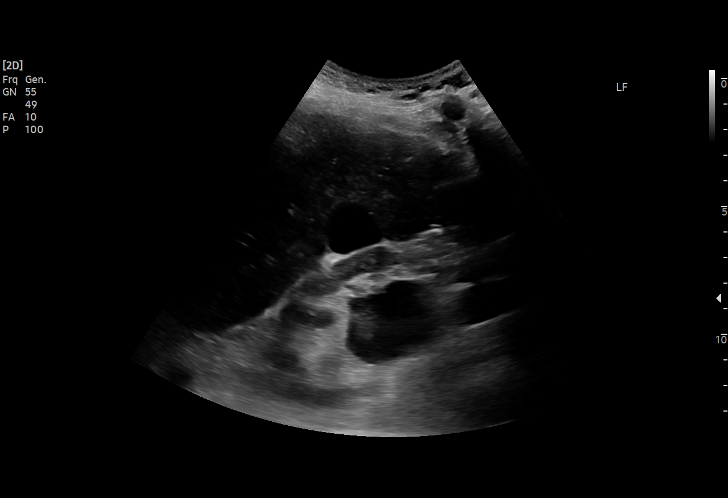
[im 3/28]
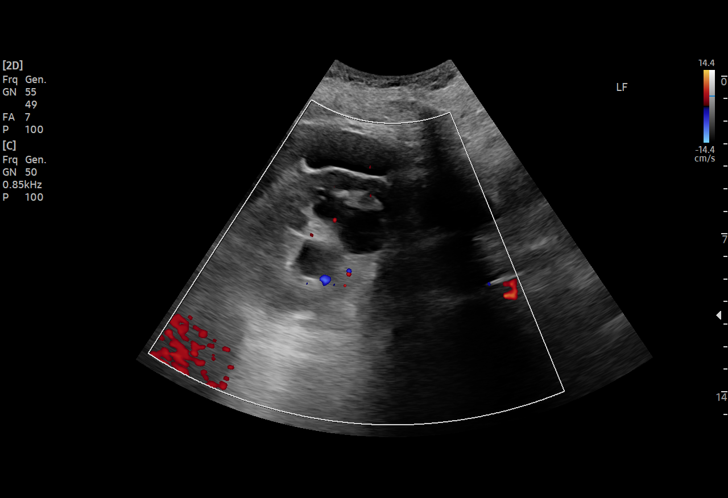
[im 5/28]
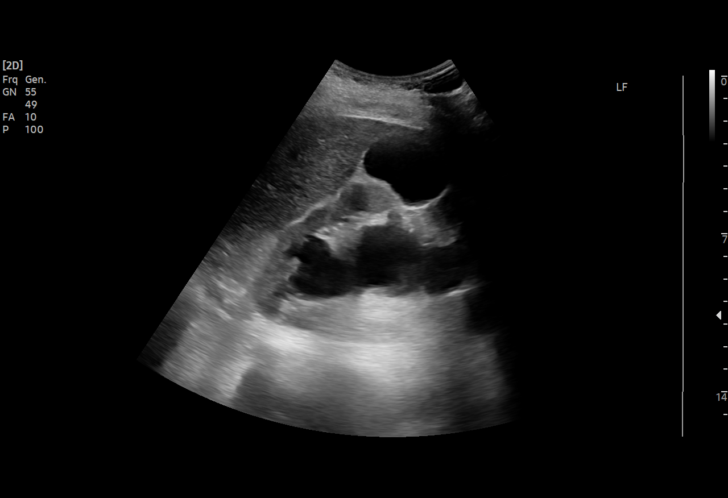
[im 6/28]
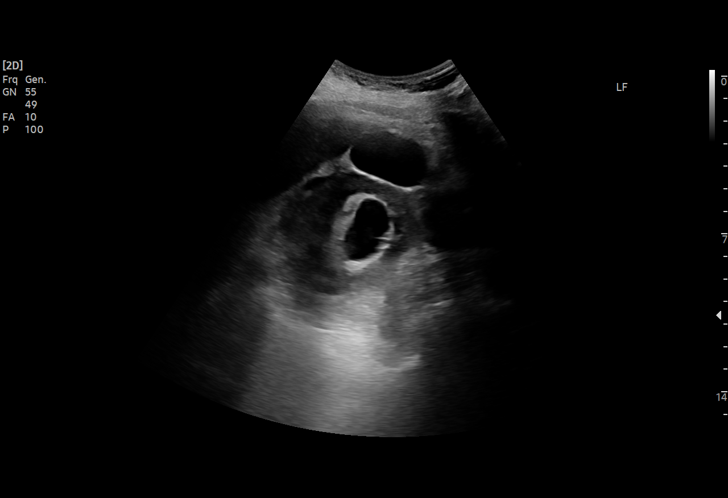
[im 8/28]
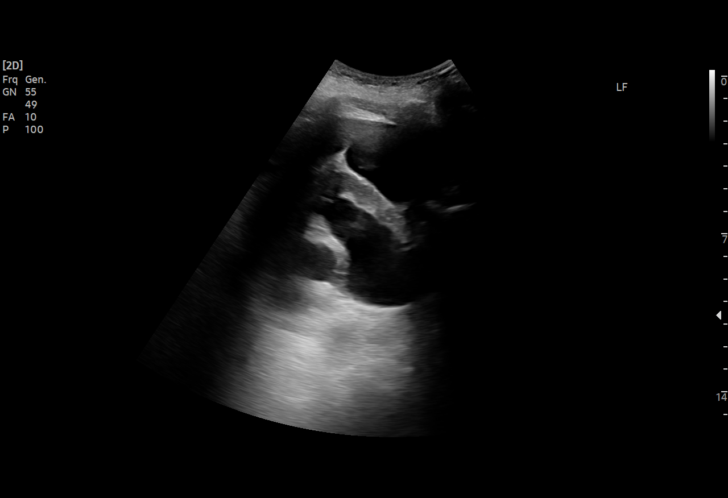
[im 11/28]
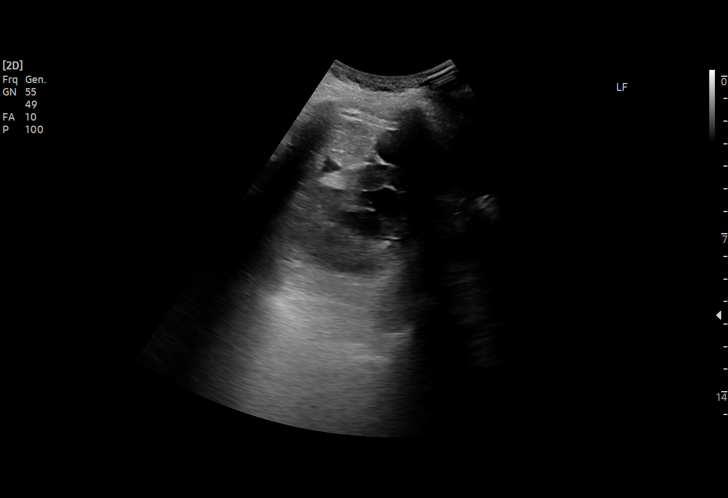
[im 12/28]
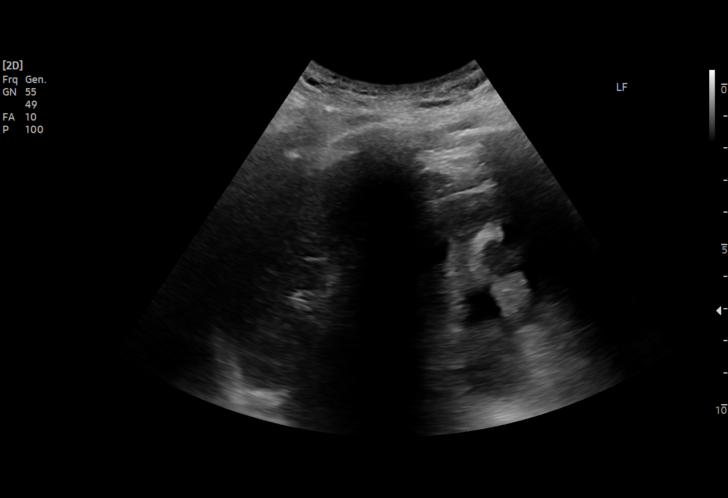
[im 14/28]
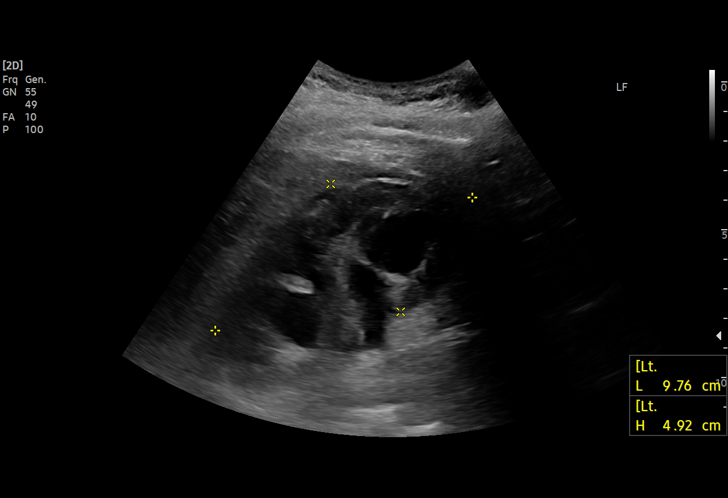
[im 16/28]
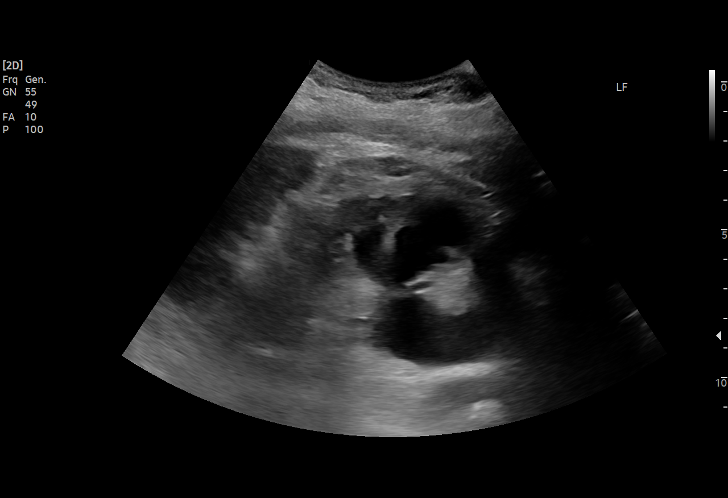
[im 17/28]
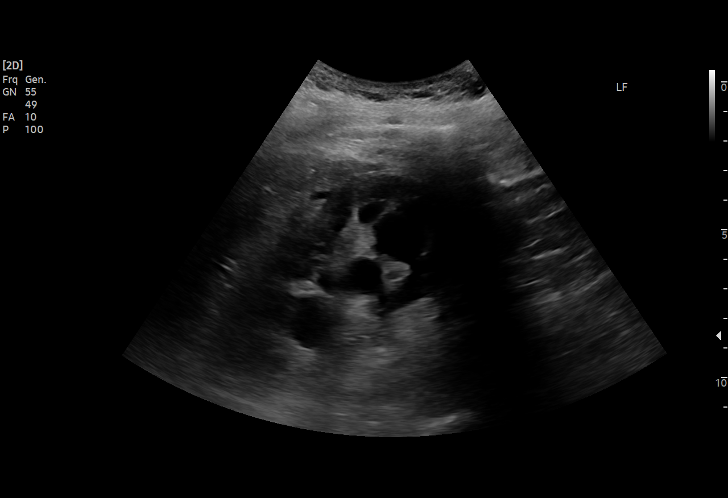
[im 20/28]
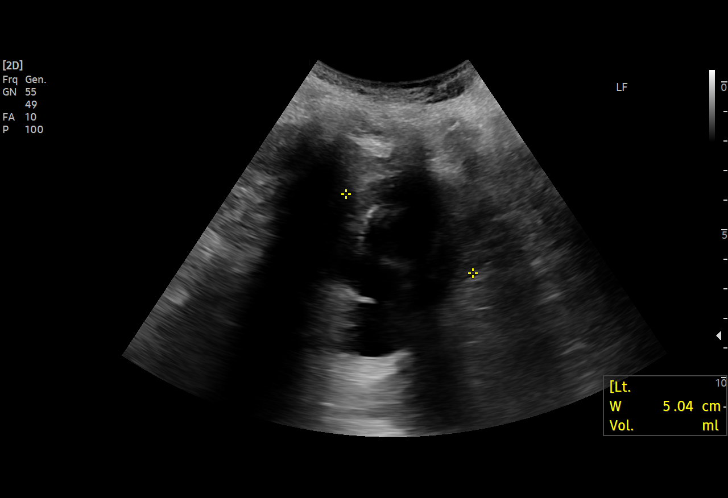
[im 22/28]
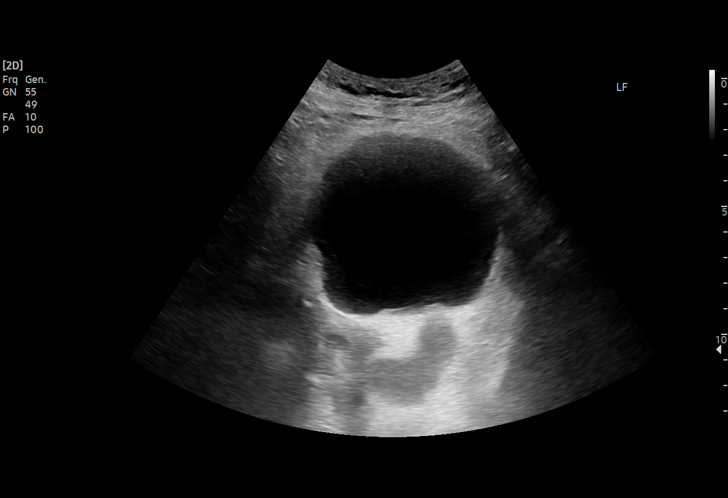
[im 23/28]
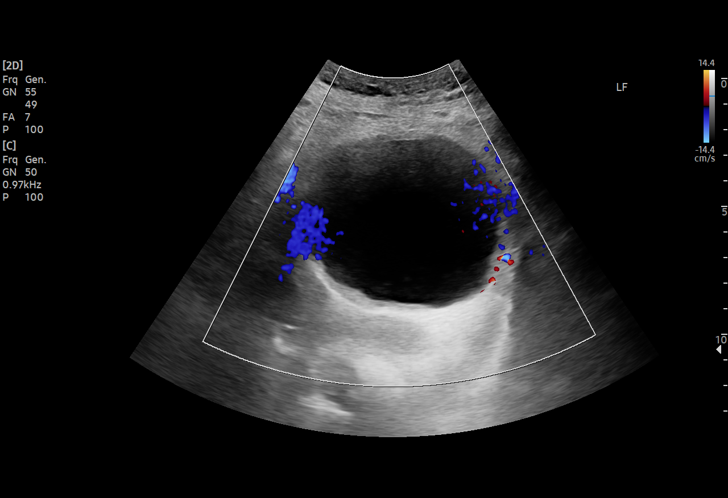
[im 25/28]
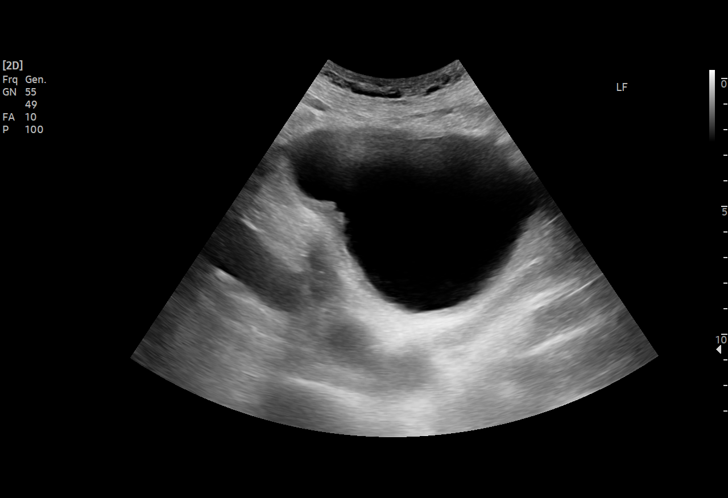
[im 28/28]
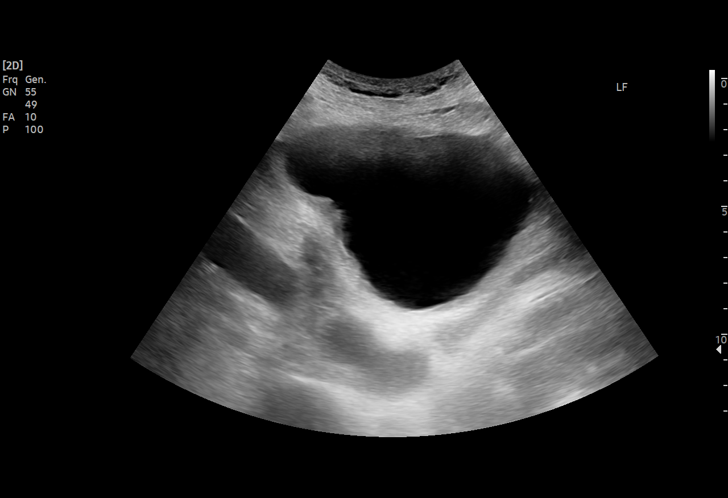

[15 of 25 positions shown; findings below may reference images not displayed]

FINDINGS: Right Kidney:

Renal measurements: 11.3 x 6.9 x 5.2 cm = volume: 212 mL. Moderate
right hydronephrosis is noted. Echogenicity within normal limits. No
mass visualized.

Left Kidney:

Renal measurements: 9.8 x 4.9 x 5.0 cm = volume: 127 mL. Moderate
left hydronephrosis is noted. Echogenicity within normal limits. No
mass visualized.

Bladder:

Appears normal for degree of bladder distention. No ureteral jets
are noted.

Other:

None.
IMPRESSION: Moderate bilateral hydronephrosis is noted. CT urogram is
recommended to rule out distal ureteral obstruction.

## 2022-07-02 IMAGING — CT CT RENAL STONE PROTOCOL
2 of 3 series · 16 of 46 positions shown, 18 images · non-contrast
Comparison: October 04, 2015.

CLINICAL DATA: Bilateral hydronephrosis.

EXAM:
CT ABDOMEN AND PELVIS WITHOUT CONTRAST
TECHNIQUE: Multidetector CT imaging of the abdomen and pelvis was performed
following the standard protocol without IV contrast.

[Series 4: lung bases · axial · 0.68mm/px · z∈[+1288,+1370]mm · 13 of 49 slices shown, 15 images]
[im 4/49  soft-tissue]
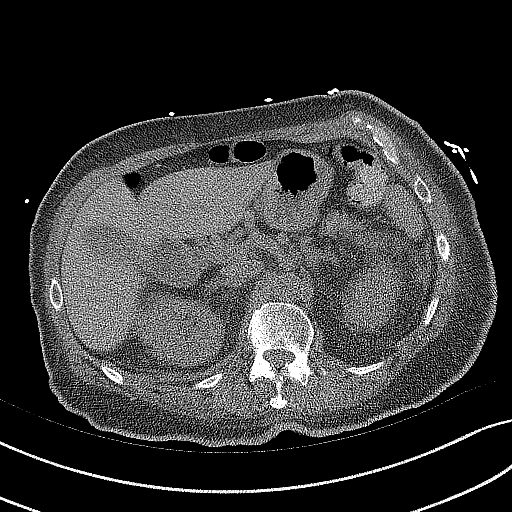
[im 4/49  bone]
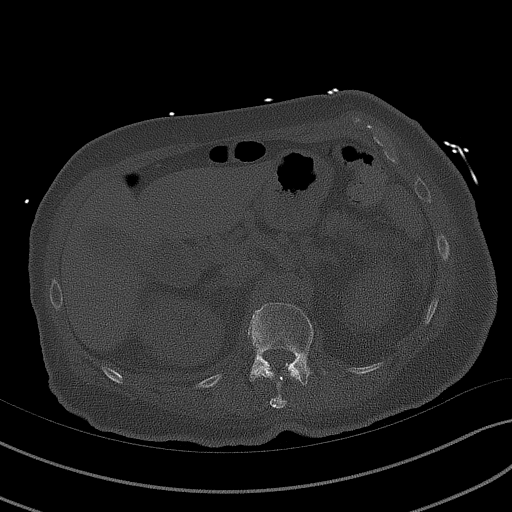
[im 7/49  soft-tissue]
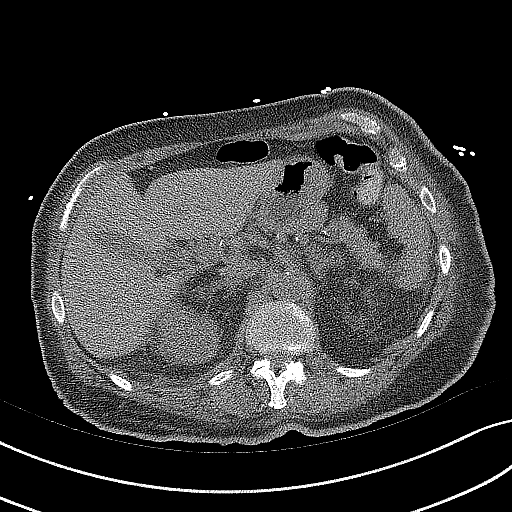
[im 10/49  soft-tissue]
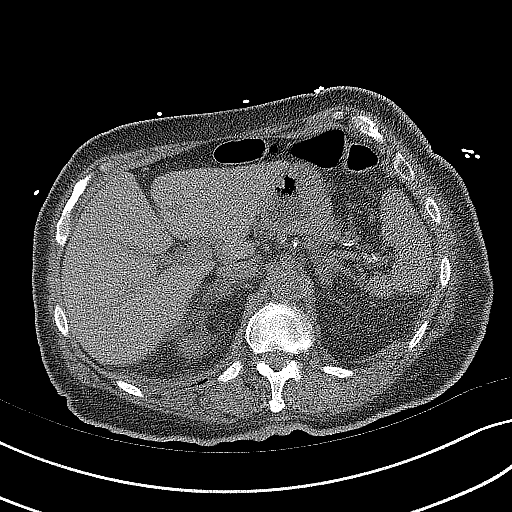
[im 14/49  soft-tissue]
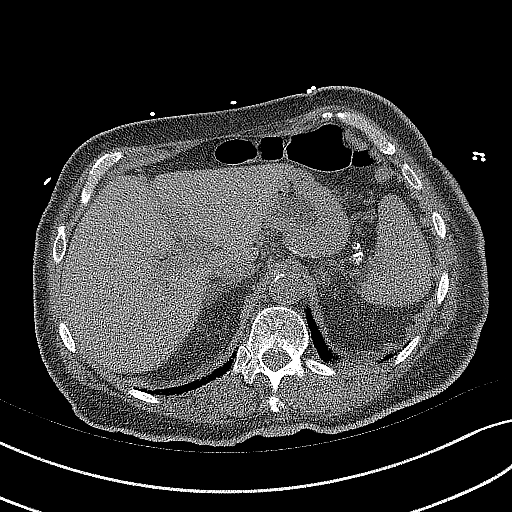
[im 18/49  soft-tissue]
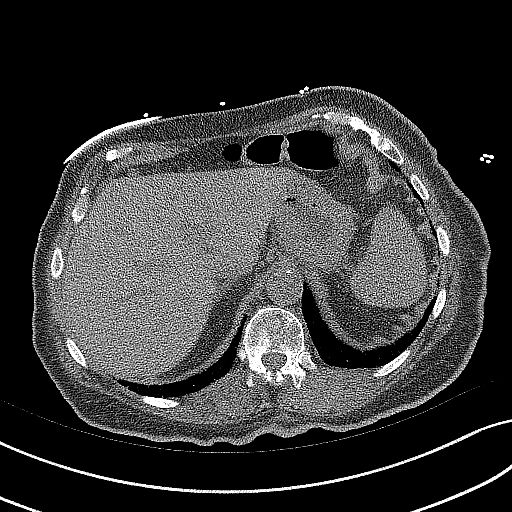
[im 21/49  soft-tissue]
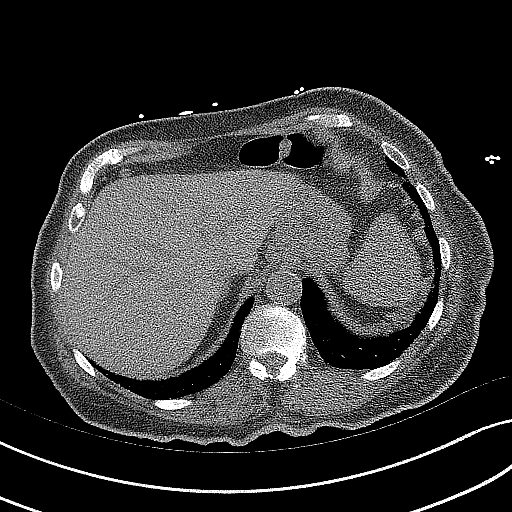
[im 25/49  soft-tissue]
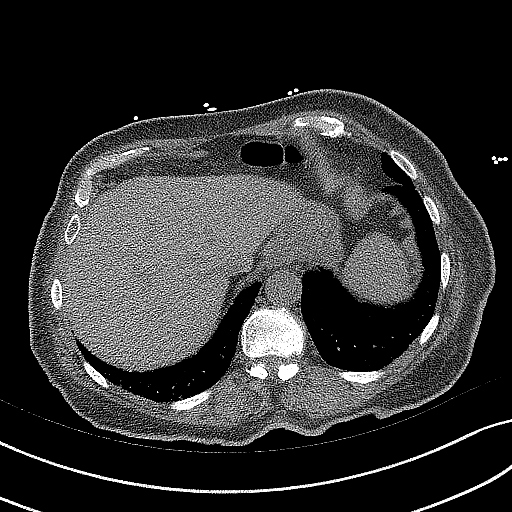
[im 28/49  soft-tissue]
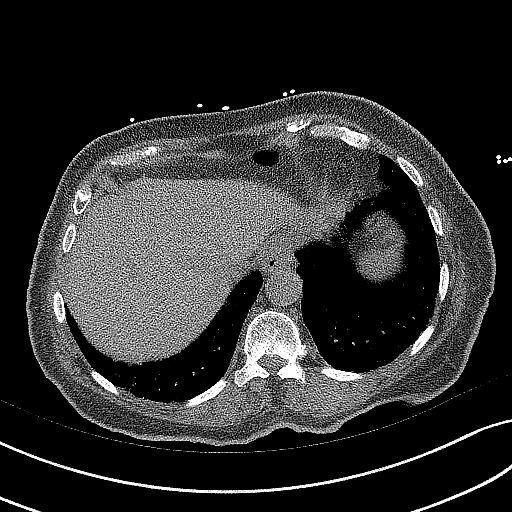
[im 31/49  soft-tissue]
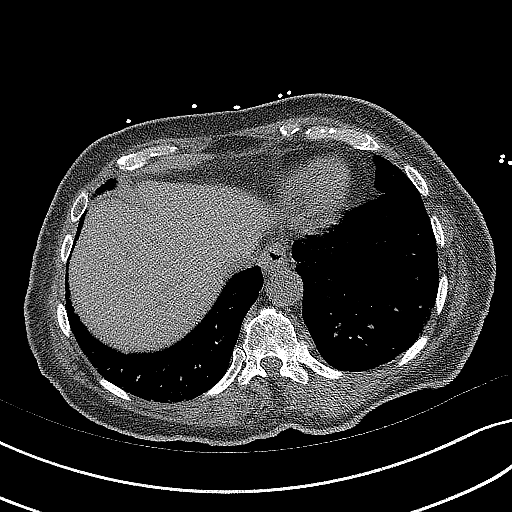
[im 31/49  bone]
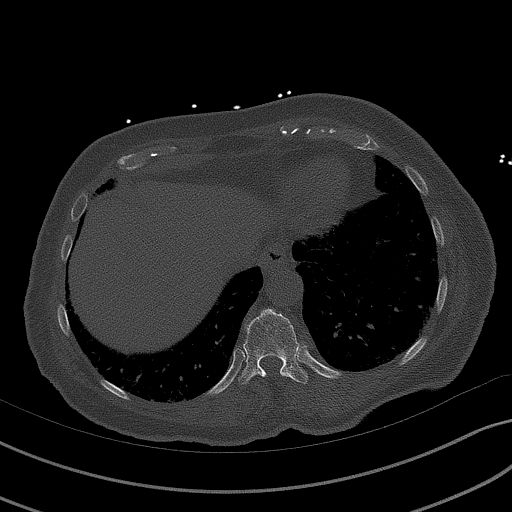
[im 35/49  soft-tissue]
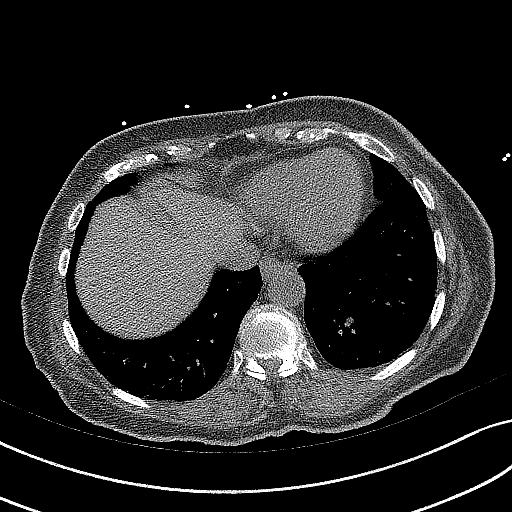
[im 39/49  soft-tissue]
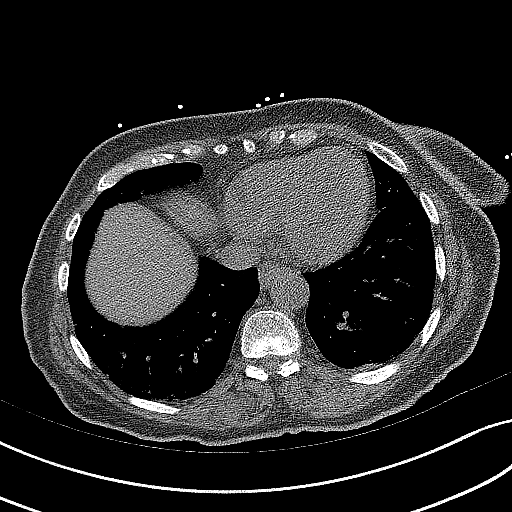
[im 42/49  soft-tissue]
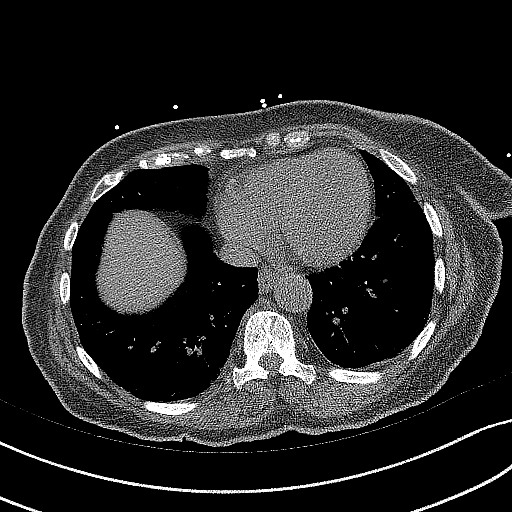
[im 45/49  soft-tissue]
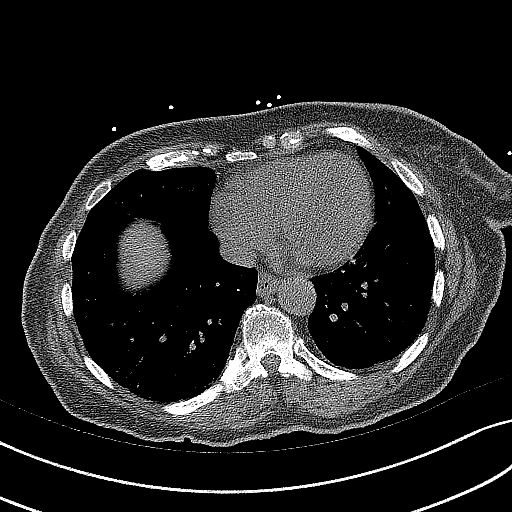

[Series 5: coronal · coronal · 0.69mm/px · 3 of 117 slices shown]
[im 39/117  soft-tissue]
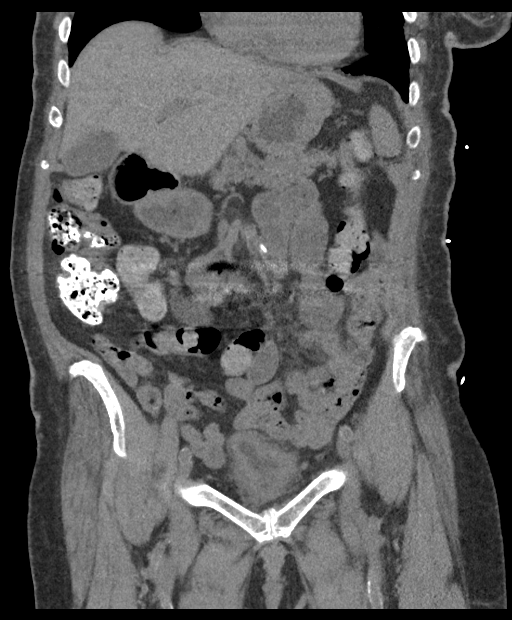
[im 52/117  soft-tissue]
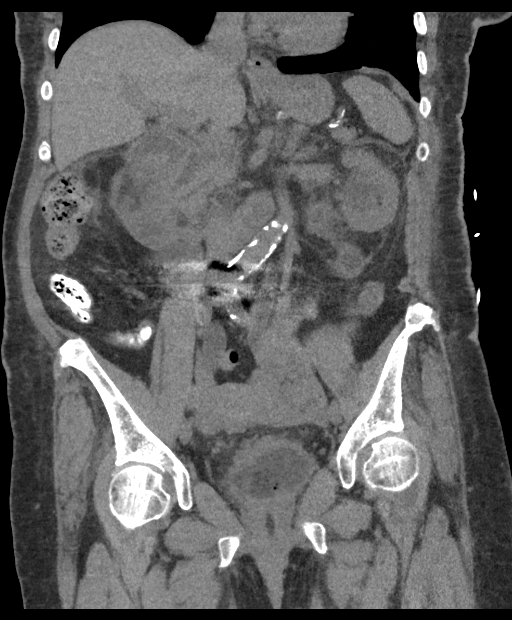
[im 65/117  soft-tissue]
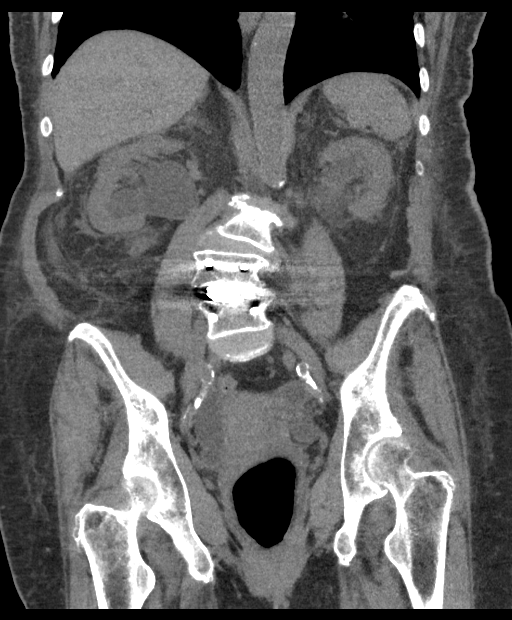

[16 of 46 positions shown; findings below may reference images not displayed]

FINDINGS: Lower chest: No acute abnormality.

Hepatobiliary: No focal liver abnormality is seen. No gallstones,
gallbladder wall thickening, or biliary dilatation.

Pancreas: Unremarkable. No pancreatic ductal dilatation or
surrounding inflammatory changes.

Spleen: Normal in size without focal abnormality.

Adrenals/Urinary Tract: Adrenal glands appear normal. There is
continued moderate bilateral hydroureteronephrosis with mild
perinephric stranding, but no evidence of obstructing calculus.
Urinary bladder is decompressed secondary to Foley catheter.

Stomach/Bowel: Stomach is within normal limits. Appendix appears
normal. No evidence of bowel wall thickening, distention, or
inflammatory changes.

Vascular/Lymphatic: Aortic atherosclerosis. No enlarged abdominal or
pelvic lymph nodes.

Reproductive: Uterus and bilateral adnexa are unremarkable.

Other: No abdominal wall hernia or abnormality. No abdominopelvic
ascites.

Musculoskeletal: No acute or significant osseous findings.
IMPRESSION: Continued moderate bilateral hydroureteronephrosis is noted with
mild perinephric stranding, but no evidence of obstructing calculus.
Urinary bladder is decompressed secondary to Foley catheter.

Aortic Atherosclerosis (HQNN4-TSC.C).
